# Patient Record
Sex: Female | Born: 1960
Health system: Southern US, Community
[De-identification: ages and names within clinical notes are randomized; demographics above are authoritative.]

## PROBLEM LIST (undated history)

## (undated) DIAGNOSIS — Z5189 Encounter for other specified aftercare: Secondary | ICD-10-CM

## (undated) DIAGNOSIS — F419 Anxiety disorder, unspecified: Secondary | ICD-10-CM

## (undated) DIAGNOSIS — F32A Depression, unspecified: Secondary | ICD-10-CM

## (undated) DIAGNOSIS — K3184 Gastroparesis: Secondary | ICD-10-CM

## (undated) DIAGNOSIS — R410 Disorientation, unspecified: Secondary | ICD-10-CM

## (undated) DIAGNOSIS — G8929 Other chronic pain: Secondary | ICD-10-CM

## (undated) DIAGNOSIS — M549 Dorsalgia, unspecified: Secondary | ICD-10-CM

## (undated) DIAGNOSIS — IMO0001 Reserved for inherently not codable concepts without codable children: Secondary | ICD-10-CM

## (undated) DIAGNOSIS — F329 Major depressive disorder, single episode, unspecified: Secondary | ICD-10-CM

## (undated) DIAGNOSIS — E162 Hypoglycemia, unspecified: Secondary | ICD-10-CM

## (undated) DIAGNOSIS — C50919 Malignant neoplasm of unspecified site of unspecified female breast: Secondary | ICD-10-CM

## (undated) DIAGNOSIS — R413 Other amnesia: Secondary | ICD-10-CM

## (undated) DIAGNOSIS — R51 Headache: Secondary | ICD-10-CM

## (undated) DIAGNOSIS — K219 Gastro-esophageal reflux disease without esophagitis: Secondary | ICD-10-CM

## (undated) DIAGNOSIS — G35 Multiple sclerosis: Secondary | ICD-10-CM

## (undated) DIAGNOSIS — G709 Myoneural disorder, unspecified: Secondary | ICD-10-CM

## (undated) HISTORY — DX: Hypoglycemia, unspecified: E16.2

## (undated) HISTORY — DX: Myoneural disorder, unspecified: G70.9

## (undated) HISTORY — DX: Multiple sclerosis: G35

## (undated) HISTORY — DX: Gastro-esophageal reflux disease without esophagitis: K21.9

## (undated) HISTORY — DX: Malignant neoplasm of unspecified site of unspecified female breast: C50.919

---

## 1985-12-10 DIAGNOSIS — G35 Multiple sclerosis: Secondary | ICD-10-CM

## 1985-12-10 HISTORY — DX: Multiple sclerosis: G35

## 2000-12-10 HISTORY — PX: BREAST LUMPECTOMY: SHX2

## 2000-12-10 HISTORY — PX: BREAST BIOPSY: SHX20

## 2001-10-10 DIAGNOSIS — C50919 Malignant neoplasm of unspecified site of unspecified female breast: Secondary | ICD-10-CM

## 2001-10-10 HISTORY — DX: Malignant neoplasm of unspecified site of unspecified female breast: C50.919

## 2001-10-17 ENCOUNTER — Other Ambulatory Visit: Admission: RE | Admit: 2001-10-17 | Discharge: 2001-10-17 | Payer: Self-pay | Admitting: General Surgery

## 2001-10-28 ENCOUNTER — Encounter: Admission: RE | Admit: 2001-10-28 | Discharge: 2001-10-28 | Payer: Self-pay | Admitting: General Surgery

## 2001-10-28 ENCOUNTER — Encounter: Payer: Self-pay | Admitting: General Surgery

## 2001-10-29 ENCOUNTER — Encounter (INDEPENDENT_AMBULATORY_CARE_PROVIDER_SITE_OTHER): Payer: Self-pay | Admitting: Specialist

## 2001-10-29 ENCOUNTER — Ambulatory Visit (HOSPITAL_BASED_OUTPATIENT_CLINIC_OR_DEPARTMENT_OTHER): Admission: RE | Admit: 2001-10-29 | Discharge: 2001-10-29 | Payer: Self-pay | Admitting: General Surgery

## 2001-11-24 ENCOUNTER — Emergency Department (HOSPITAL_COMMUNITY): Admission: EM | Admit: 2001-11-24 | Discharge: 2001-11-25 | Payer: Self-pay | Admitting: Emergency Medicine

## 2001-12-05 ENCOUNTER — Ambulatory Visit: Admission: RE | Admit: 2001-12-05 | Discharge: 2002-03-05 | Payer: Self-pay | Admitting: Radiation Oncology

## 2001-12-08 ENCOUNTER — Ambulatory Visit (HOSPITAL_COMMUNITY): Admission: RE | Admit: 2001-12-08 | Discharge: 2001-12-08 | Payer: Self-pay | Admitting: Oncology

## 2001-12-08 ENCOUNTER — Encounter: Payer: Self-pay | Admitting: Oncology

## 2001-12-11 ENCOUNTER — Ambulatory Visit (HOSPITAL_COMMUNITY): Admission: RE | Admit: 2001-12-11 | Discharge: 2001-12-11 | Payer: Self-pay | Admitting: Oncology

## 2001-12-11 ENCOUNTER — Encounter: Payer: Self-pay | Admitting: Oncology

## 2001-12-25 ENCOUNTER — Ambulatory Visit (HOSPITAL_COMMUNITY): Admission: RE | Admit: 2001-12-25 | Discharge: 2001-12-25 | Payer: Self-pay | Admitting: Thoracic Surgery

## 2002-01-01 ENCOUNTER — Encounter: Payer: Self-pay | Admitting: Thoracic Surgery

## 2002-01-01 ENCOUNTER — Encounter (INDEPENDENT_AMBULATORY_CARE_PROVIDER_SITE_OTHER): Payer: Self-pay | Admitting: Specialist

## 2002-01-01 ENCOUNTER — Encounter (INDEPENDENT_AMBULATORY_CARE_PROVIDER_SITE_OTHER): Payer: Self-pay | Admitting: *Deleted

## 2002-01-01 ENCOUNTER — Ambulatory Visit (HOSPITAL_COMMUNITY): Admission: RE | Admit: 2002-01-01 | Discharge: 2002-01-01 | Payer: Self-pay | Admitting: Thoracic Surgery

## 2002-03-16 ENCOUNTER — Ambulatory Visit: Admission: RE | Admit: 2002-03-16 | Discharge: 2002-06-14 | Payer: Self-pay | Admitting: Radiation Oncology

## 2002-05-26 ENCOUNTER — Encounter: Payer: Self-pay | Admitting: Emergency Medicine

## 2002-05-27 ENCOUNTER — Inpatient Hospital Stay (HOSPITAL_COMMUNITY): Admission: EM | Admit: 2002-05-27 | Discharge: 2002-05-27 | Payer: Self-pay | Admitting: Oncology

## 2003-03-31 ENCOUNTER — Encounter: Payer: Self-pay | Admitting: General Surgery

## 2003-03-31 ENCOUNTER — Encounter: Admission: RE | Admit: 2003-03-31 | Discharge: 2003-03-31 | Payer: Self-pay | Admitting: General Surgery

## 2004-01-12 ENCOUNTER — Emergency Department (HOSPITAL_COMMUNITY): Admission: EM | Admit: 2004-01-12 | Discharge: 2004-01-13 | Payer: Self-pay

## 2004-04-19 ENCOUNTER — Inpatient Hospital Stay (HOSPITAL_COMMUNITY): Admission: EM | Admit: 2004-04-19 | Discharge: 2004-04-24 | Payer: Self-pay

## 2004-05-11 ENCOUNTER — Encounter: Admission: RE | Admit: 2004-05-11 | Discharge: 2004-08-09 | Payer: Self-pay

## 2005-01-24 ENCOUNTER — Ambulatory Visit: Payer: Self-pay | Admitting: Internal Medicine

## 2005-02-08 ENCOUNTER — Ambulatory Visit: Payer: Self-pay | Admitting: Oncology

## 2005-06-28 ENCOUNTER — Ambulatory Visit: Payer: Self-pay | Admitting: Oncology

## 2005-10-03 ENCOUNTER — Ambulatory Visit: Payer: Self-pay | Admitting: Internal Medicine

## 2005-11-21 ENCOUNTER — Ambulatory Visit: Payer: Self-pay | Admitting: Family Medicine

## 2005-12-31 ENCOUNTER — Ambulatory Visit: Payer: Self-pay | Admitting: Oncology

## 2006-02-22 ENCOUNTER — Ambulatory Visit: Payer: Self-pay | Admitting: Internal Medicine

## 2006-03-26 ENCOUNTER — Ambulatory Visit: Payer: Self-pay | Admitting: Internal Medicine

## 2006-06-27 ENCOUNTER — Ambulatory Visit: Payer: Self-pay | Admitting: Oncology

## 2006-07-11 LAB — ESTRADIOL, ULTRA SENS: Estradiol, Ultra Sensitive: 8 pg/mL

## 2006-08-14 ENCOUNTER — Emergency Department (HOSPITAL_COMMUNITY): Admission: EM | Admit: 2006-08-14 | Discharge: 2006-08-14 | Payer: Self-pay | Admitting: Emergency Medicine

## 2006-12-26 ENCOUNTER — Ambulatory Visit: Payer: Self-pay | Admitting: Oncology

## 2007-01-06 ENCOUNTER — Emergency Department (HOSPITAL_COMMUNITY): Admission: EM | Admit: 2007-01-06 | Discharge: 2007-01-07 | Payer: Self-pay | Admitting: Emergency Medicine

## 2007-06-27 ENCOUNTER — Ambulatory Visit: Payer: Self-pay | Admitting: Oncology

## 2007-07-01 ENCOUNTER — Encounter: Payer: Self-pay | Admitting: Internal Medicine

## 2007-08-06 DIAGNOSIS — Z853 Personal history of malignant neoplasm of breast: Secondary | ICD-10-CM | POA: Insufficient documentation

## 2007-10-29 ENCOUNTER — Ambulatory Visit: Payer: Self-pay | Admitting: Oncology

## 2007-10-30 ENCOUNTER — Encounter: Payer: Self-pay | Admitting: Internal Medicine

## 2007-10-31 ENCOUNTER — Encounter: Payer: Self-pay | Admitting: Internal Medicine

## 2008-01-07 ENCOUNTER — Ambulatory Visit: Payer: Self-pay | Admitting: Internal Medicine

## 2008-01-07 DIAGNOSIS — J069 Acute upper respiratory infection, unspecified: Secondary | ICD-10-CM | POA: Insufficient documentation

## 2008-03-09 ENCOUNTER — Ambulatory Visit: Payer: Self-pay | Admitting: Internal Medicine

## 2008-04-22 ENCOUNTER — Encounter: Payer: Self-pay | Admitting: Internal Medicine

## 2008-04-27 ENCOUNTER — Ambulatory Visit: Payer: Self-pay | Admitting: Oncology

## 2008-04-29 ENCOUNTER — Encounter: Payer: Self-pay | Admitting: Internal Medicine

## 2008-06-21 ENCOUNTER — Ambulatory Visit: Payer: Self-pay | Admitting: Internal Medicine

## 2008-08-17 ENCOUNTER — Emergency Department (HOSPITAL_COMMUNITY): Admission: EM | Admit: 2008-08-17 | Discharge: 2008-08-17 | Payer: Self-pay | Admitting: Emergency Medicine

## 2008-10-11 ENCOUNTER — Encounter: Payer: Self-pay | Admitting: Internal Medicine

## 2008-10-26 ENCOUNTER — Ambulatory Visit: Payer: Self-pay | Admitting: Oncology

## 2008-10-28 ENCOUNTER — Encounter: Payer: Self-pay | Admitting: Internal Medicine

## 2008-11-25 ENCOUNTER — Ambulatory Visit: Payer: Self-pay | Admitting: Internal Medicine

## 2009-04-13 ENCOUNTER — Encounter: Payer: Self-pay | Admitting: Internal Medicine

## 2009-05-29 ENCOUNTER — Emergency Department (HOSPITAL_COMMUNITY): Admission: EM | Admit: 2009-05-29 | Discharge: 2009-05-29 | Payer: Self-pay | Admitting: Emergency Medicine

## 2009-05-31 ENCOUNTER — Ambulatory Visit: Payer: Self-pay | Admitting: Internal Medicine

## 2009-05-31 DIAGNOSIS — K529 Noninfective gastroenteritis and colitis, unspecified: Secondary | ICD-10-CM | POA: Insufficient documentation

## 2009-08-04 ENCOUNTER — Ambulatory Visit: Payer: Self-pay | Admitting: Oncology

## 2009-08-08 ENCOUNTER — Encounter: Payer: Self-pay | Admitting: Internal Medicine

## 2009-10-26 ENCOUNTER — Encounter (INDEPENDENT_AMBULATORY_CARE_PROVIDER_SITE_OTHER): Payer: Self-pay | Admitting: *Deleted

## 2010-02-13 ENCOUNTER — Ambulatory Visit: Payer: Self-pay | Admitting: Internal Medicine

## 2010-02-13 DIAGNOSIS — Z8719 Personal history of other diseases of the digestive system: Secondary | ICD-10-CM | POA: Insufficient documentation

## 2010-02-16 ENCOUNTER — Ambulatory Visit: Payer: Self-pay | Admitting: Internal Medicine

## 2010-02-16 ENCOUNTER — Encounter: Payer: Self-pay | Admitting: Internal Medicine

## 2010-02-16 DIAGNOSIS — R269 Unspecified abnormalities of gait and mobility: Secondary | ICD-10-CM | POA: Insufficient documentation

## 2010-02-16 DIAGNOSIS — R5383 Other fatigue: Secondary | ICD-10-CM

## 2010-02-16 DIAGNOSIS — R5381 Other malaise: Secondary | ICD-10-CM | POA: Insufficient documentation

## 2010-02-16 DIAGNOSIS — G35 Multiple sclerosis: Secondary | ICD-10-CM | POA: Insufficient documentation

## 2010-02-23 ENCOUNTER — Encounter: Payer: Self-pay | Admitting: Internal Medicine

## 2010-02-24 ENCOUNTER — Telehealth: Payer: Self-pay | Admitting: Internal Medicine

## 2010-03-02 ENCOUNTER — Ambulatory Visit: Payer: Self-pay | Admitting: Oncology

## 2010-03-06 ENCOUNTER — Encounter: Payer: Self-pay | Admitting: Internal Medicine

## 2010-03-27 ENCOUNTER — Encounter: Payer: Self-pay | Admitting: Internal Medicine

## 2010-04-03 ENCOUNTER — Encounter: Payer: Self-pay | Admitting: Internal Medicine

## 2010-04-18 ENCOUNTER — Encounter: Payer: Self-pay | Admitting: Internal Medicine

## 2010-04-19 ENCOUNTER — Encounter: Payer: Self-pay | Admitting: Internal Medicine

## 2010-06-30 ENCOUNTER — Encounter: Payer: Self-pay | Admitting: Internal Medicine

## 2010-07-27 ENCOUNTER — Ambulatory Visit: Payer: Self-pay | Admitting: Internal Medicine

## 2010-09-21 ENCOUNTER — Telehealth: Payer: Self-pay | Admitting: Internal Medicine

## 2010-10-30 ENCOUNTER — Encounter: Payer: Self-pay | Admitting: Internal Medicine

## 2011-01-11 NOTE — Medication Information (Signed)
Summary: Written Order for Mobility Assessment/[facility]  Written Order for Mobility Assessment/[facility]   Imported By: Maryln Gottron 04/06/2010 14:12:28  _____________________________________________________________________  External Attachment:    Type:   Image     Comment:   External Document

## 2011-01-11 NOTE — Letter (Signed)
Summary: [facility] Sclerosis  [facility]Multiple [facility]   Imported By: Maryln Gottron 11/08/2010 15:46:21  _____________________________________________________________________  External Attachment:    Type:   Image     Comment:   External Document

## 2011-01-11 NOTE — Letter (Signed)
Summary: [facility]  [facility] [facility]   Imported By: Maryln Gottron 04/26/2010 13:10:55  _____________________________________________________________________  External Attachment:    Type:   Image     Comment:   External Document

## 2011-01-11 NOTE — Assessment & Plan Note (Signed)
Summary: discuss getting a scooter//ccm   Vital Signs:  Patient profile:   50 year old female Weight:      129 pounds Temp:     98.4 degrees F oral BP sitting:   118 / 68  (right arm) Cuff size:   regular  Vitals Entered By: Duard Brady LPN (February 16, 2010 2:46 PM) CC: needs to be eval'd for scooter for mobility  Is Patient Diabetic? No   CC:  needs to be eval'd for scooter for mobility .  History of Present Illness: 50 year old patient who is seen today for follow-up.  She has been seen recently for an episode of bright red rectal bleeding, which has not recurred.  She is seen today for a face-to-face mobility, examination and report.  She has a long history of multiple sclerosis and gait instability.  She is unable to analyte without a 4-point walker and tires easily.  she has an ataxic gait, as well as the some upper extremity ataxia.  The left arm greater than the right. Face-to-face examination and report performed  and  questionnaire completed  Preventive Screening-Counseling & Management  Alcohol-Tobacco     Smoking Status: current  Allergies: 1)  ! Sulf-10  Past History:  Past Medical History: Breast cancer, hx of  (stage II T2 NO MO) Multiple sclerosis gravida one, para zero, abortus one ataxic gait  Family History: Reviewed history from 01/07/2008 and no changes required. paternal grandmother died with leukemia.  Three maternal aunts with breast cancer.  Mother history ovarian cancer and died from complications of diabetes one brother 3 sisters  Review of Systems       The patient complains of muscle weakness and difficulty walking.  The patient denies anorexia, fever, weight loss, weight gain, vision loss, decreased hearing, hoarseness, chest pain, syncope, dyspnea on exertion, peripheral edema, prolonged cough, headaches, hemoptysis, abdominal pain, melena, hematochezia, severe indigestion/heartburn, hematuria, incontinence, genital sores, suspicious  skin lesions, transient blindness, depression, unusual weight change, abnormal bleeding, enlarged lymph nodes, angioedema, breast masses, and testicular masses.    Physical Exam  General:  Well-developed,well-nourished,in no acute distress; alert,appropriate and cooperative throughout examination; normal blood pressure Head:  Normocephalic and atraumatic without obvious abnormalities. No apparent alopecia or balding. Eyes:  No corneal or conjunctival inflammation noted. EOMI. Perrla. Funduscopic exam benign, without hemorrhages, exudates or papilledema. Vision grossly normal. Mouth:  Oral mucosa and oropharynx without lesions or exudates.  Teeth in good repair. Neck:  No deformities, masses, or tenderness noted. Lungs:  Normal respiratory effort, chest expands symmetrically. Lungs are clear to auscultation, no crackles or wheezes. Heart:  Normal rate and regular rhythm. S1 and S2 normal without gallop, murmur, click, rub or other extra sounds. Neurologic:  alert & oriented X3 and cranial nerves II-XII intact.   mild the lower extremity weakness mild right hand dyspraxia and gross ataxia of the left hand; ataxic gait   Impression & Recommendations:  Problem # 1:  RECTAL BLEEDING, HX OF (ICD-V12.79) stable  Problem # 2:  BREAST CANCER, HX OF (ICD-V10.3)  Problem # 3:  MULTIPLE SCLEROSIS, RELAPSING/REMITTING (ICD-340)  Problem # 4:  GAIT ATAXIA (ICD-781.2)  Problem # 5:  WEAKNESS (ICD-780.79)  Complete Medication List: 1)  Protonix 40 Mg Tbec (Pantoprazole sodium) .[name].. 1 once daily 2)  Klonopin 0.5 Mg Tabs (Clonazepam) .... 2 q am 3)  Amantadine Hcl 100 Mg Tabs (Amantadine hcl) .[name].. 1 two times a day 4)  Rebif 44 Mcg/0.23ml Soln (Interferon beta-1a) .[name].. 1 3x week  5)  Ditropan Xl 5 Mg Tb24 (Oxybutynin chloride) .[name].. 1 once daily 6)  Aromasin 25 Mg Tabs (Exemestane) .[name].. 1 once daily 7)  Prascion Ra 10-5 % Crea (Sulfacetamide-sulfur-sunscreen) .... Apply two times a day 8)  Actonel  150 Mg Tabs (Risedronate sodium) .... One monthly 9)  Hydrocodone-acetaminophen 5-500 Mg Tabs (Hydrocodone-acetaminophen) .... One every 6 hours as needed for sore throat or pain  Patient Instructions: 1)  Please schedule a follow-up appointment in 3 months. 2)  neurology follow-up as scheduled

## 2011-01-11 NOTE — Medication Information (Signed)
Summary: [employer] [facility]-Additional Documentation  [employer] [facility]-Additional Documentation   Imported By: Maryln Gottron 03/29/2010 15:19:57  _____________________________________________________________________  External Attachment:    Type:   Image     Comment:   External Document

## 2011-01-11 NOTE — Letter (Signed)
Summary: Face to Face Mobility Examination Report  Face to Face Mobility Examination Report   Imported By: Maryln Gottron 02/20/2010 14:51:49  _____________________________________________________________________  External Attachment:    Type:   Image     Comment:   External Document

## 2011-01-11 NOTE — Assessment & Plan Note (Signed)
Summary: nausea/rash/tick bite?/dm   Vital Signs:  Patient profile:   50 year old female Weight:      121 pounds Temp:     98.1 degrees F oral BP sitting:   130 / 90  (left arm) Cuff size:   regular  Vitals Entered By: Kathrynn Speed CMA (July 27, 2010 11:39 AM) CC: Nausea, since yesterday, rash, tick bite, found it on Monday night,src   CC:  Nausea, since yesterday, rash, tick bite, found it on Monday night, and src.  History of Present Illness: a 50 year old patient who is seen today for follow-up.  She has a history of chronic MS.  5 days ago,   The patient had a tick exposure involving her right posterior thigh region.  There is a no local skin reaction.  She has had no systemic symptoms.  Her concern about the tick exposure prompted her visit today.  She denies any fever, myalgias, or headache  Current Medications (verified): 1)  Protonix 40 Mg Tbec (Pantoprazole Sodium) .[name].. 1 Once Daily 2)  Klonopin 0.5 Mg  Tabs (Clonazepam) .... 2 Q Am 3)  Amantadine Hcl 100 Mg  Tabs (Amantadine Hcl) .[name].. 1 Two Times A Day 4)  Rebif 44 Mcg/0.33ml  Soln (Interferon Beta-1a) .[name].. 1 3x Week 5)  Ditropan Xl 5 Mg  Tb24 (Oxybutynin Chloride) .[name].. 1 Once Daily 6)  Aromasin 25 Mg  Tabs (Exemestane) .[name].. 1 Once Daily 7)  Prascion Ra 10-5 %  Crea (Sulfacetamide-Sulfur-Sunscreen) .... Apply Two Times A Day 8)  Actonel 150 Mg  Tabs (Risedronate Sodium) .... One Monthly 9)  Hydrocodone-Acetaminophen 5-500 Mg Tabs (Hydrocodone-Acetaminophen) .... One Every 6 Hours As Needed For Sore Throat or Pain  Allergies (verified): 1)  ! Sulf-10  Past History:  Past Medical History: Reviewed history from 02/16/2010 and no changes required. Breast cancer, hx of  (stage II T2 NO MO) Multiple sclerosis gravida one, para zero, abortus one ataxic gait  Review of Systems  The patient denies anorexia, fever, weight loss, weight gain, vision loss, decreased hearing, hoarseness, chest pain, syncope, dyspnea on  exertion, peripheral edema, prolonged cough, headaches, hemoptysis, abdominal pain, melena, hematochezia, severe indigestion/heartburn, hematuria, incontinence, genital sores, muscle weakness, suspicious skin lesions, transient blindness, difficulty walking, depression, unusual weight change, abnormal bleeding, enlarged lymph nodes, angioedema, and breast masses.         unremarkable except for exposure  Physical Exam  General:  Well-developed,well-nourished,in no acute distress; alert,appropriate and cooperative throughout examination; wheelchair-bound Head:  Normocephalic and atraumatic without obvious abnormalities. No apparent alopecia or balding. Neck:  No deformities, masses, or tenderness noted. Lungs:  Normal respiratory effort, chest expands symmetrically. Lungs are clear to auscultation, no crackles or wheezes. Heart:  Normal rate and regular rhythm. S1 and S2 normal without gallop, murmur, click, rub or other extra sounds.   Impression & Recommendations:  Problem # 1:  TICK BITE (ICD-E906.4) will clinically observe at this time.  The patient was instructed to call the office immediately if she develops any fever or signs of acute illness  Problem # 2:  MULTIPLE SCLEROSIS, RELAPSING/REMITTING (ICD-340)  Complete Medication List: 1)  Protonix 40 Mg Tbec (Pantoprazole sodium) .[name].. 1 once daily 2)  Klonopin 0.5 Mg Tabs (Clonazepam) .... 2 q am 3)  Amantadine Hcl 100 Mg Tabs (Amantadine hcl) .[name].. 1 two times a day 4)  Rebif 44 Mcg/0.84ml Soln (Interferon beta-1a) .[name].. 1 3x week 5)  Ditropan Xl 5 Mg Tb24 (Oxybutynin chloride) .[name].. 1 once daily 6)  Aromasin 25 Mg Tabs (Exemestane) .[name].. 1 once daily 7)  Prascion Ra 10-5 % Crea (Sulfacetamide-sulfur-sunscreen) .... Apply two times a day 8)  Actonel 150 Mg Tabs (Risedronate sodium) .... One monthly 9)  Hydrocodone-acetaminophen 5-500 Mg Tabs (Hydrocodone-acetaminophen) .... One every 6 hours as needed for sore throat or pain  Patient  Instructions: 1)  call the office immediately if  you develops fever or any signs of illness

## 2011-01-11 NOTE — Letter (Signed)
Summary: [facility] Delivered   Imported By: Maryln Gottron 08/02/2010 14:12:40  _____________________________________________________________________  External Attachment:    Type:   Image     Comment:   External Document

## 2011-01-11 NOTE — Progress Notes (Signed)
Summary: changes/clarifications needed  Phone Note From Other Clinic   Caller: erica jones, [facility] [phone] Summary of Call: Refaxing form to you for clarifications or changes she needs.  5 pages, see all pages, she cannot make changes on the original & will need you to make them on the original & fax back.   Initial call taken by: Rudy Jew, RN,  February 24, 2010 2:18 PM    done

## 2011-01-11 NOTE — Letter (Signed)
[facility]   Imported By: Maryln Gottron 03/23/2010 13:08:51  _____________________________________________________________________  External Attachment:    Type:   Image     Comment:   External Document

## 2011-01-11 NOTE — Medication Information (Signed)
Summary: Additional Documentation/[employer]  Additional Documentation/[employer] [facility]   Imported By: Maryln Gottron 02/24/2010 12:35:15  _____________________________________________________________________  External Attachment:    Type:   Image     Comment:   External Document

## 2011-01-11 NOTE — Progress Notes (Signed)
Summary: refill pantoprazole  Phone Note Refill Request Message from:  Fax from Pharmacy on September 21, 2010 10:07 AM  pantoprazole 40mg  [facility]   Method Requested: Fax to Local Pharmacy Initial call taken by: Duard Brady LPN,  September 21, 2010 10:07 AM    Prescriptions: PROTONIX 40 MG TBEC (PANTOPRAZOLE SODIUM) 1 once daily  #90 x 3   Entered by:   Duard Brady LPN   Authorized by:   Gordy Savers  MD   Signed by:   Duard Brady LPN on 16/09/9603   Method used:   Historical   RxID:   [id number]  faxed back to [facility]   KIK

## 2011-01-11 NOTE — Assessment & Plan Note (Signed)
Summary: rectal bleeding/cdw   Vital Signs:  Patient profile:   50 year old female Weight:      127 pounds Temp:     97.9 degrees F oral BP sitting:   118 / 80  (right arm) Cuff size:   regular  Vitals Entered By: Duard Brady LPN (February 13, 1609 11:55 AM) CC: c/o rectal belle this am only - more than 1tsp noted in toilet - not with BM Is Patient Diabetic? No   CC:  c/o rectal belle this am only - more than 1tsp noted in toilet - not with BM.  History of Present Illness: 50 year old patient who is seen today for follow-up after a single episode of emesis bright red rectal bleeding that occurred earlier today.  Her bowel movements, was otherwise unremarkable and not associated with pain.  This too, was of normal color and consistency.  She denies any history of diverticulosis and hemorrhoids.  Her prior history of rectal bleeding.  No family history of colon cancer.  Denies any chronic abdominal pain or change in her bowel habits  Preventive Screening-Counseling & Management  Alcohol-Tobacco     Smoking Status: current  Allergies: 1)  ! Sulf-10  Past History:  Past Medical History: Reviewed history from 01/07/2008 and no changes required. Breast cancer, hx of  (stage II T2 NO MO) Multiple sclerosis gravida one, para zero, abortus one  Social History: Smoking Status:  current  Review of Systems       The patient complains of hematochezia.  The patient denies anorexia, fever, weight loss, weight gain, vision loss, decreased hearing, hoarseness, chest pain, syncope, dyspnea on exertion, peripheral edema, prolonged cough, headaches, hemoptysis, abdominal pain, melena, severe indigestion/heartburn, hematuria, incontinence, genital sores, muscle weakness, suspicious skin lesions, transient blindness, difficulty walking, depression, unusual weight change, abnormal bleeding, enlarged lymph nodes, angioedema, and breast masses.    Physical Exam  General:   Well-developed,well-nourished,in no acute distress; alert,appropriate and cooperative throughout examination Head:  Normocephalic and atraumatic without obvious abnormalities. No apparent alopecia or balding. Neck:  No deformities, masses, or tenderness noted. Lungs:  Normal respiratory effort, chest expands symmetrically. Lungs are clear to auscultation, no crackles or wheezes. Heart:  Normal rate and regular rhythm. S1 and S2 normal without gallop, murmur, click, rub or other extra sounds. Rectal:  No external abnormalities noted. Normal sphincter tone. No rectal masses or tenderness. stool was hematest positive   Impression & Recommendations:  Problem # 1:  RECTAL BLEEDING, HX OF (ICD-V12.79) this appears to be a single episode of rectal bleeding.  She has a normal clinical exam.  Will clinically observe at this time and refer for colonoscopy.  If bleeding recurs.  Otherwise, a screening colonoscopy will be obtained at age 5  Complete Medication List: 1)  Protonix 40 Mg Tbec (Pantoprazole sodium) .[name].. 1 once daily 2)  Klonopin 0.5 Mg Tabs (Clonazepam) .... 2 q am 3)  Amantadine Hcl 100 Mg Tabs (Amantadine hcl) .[name].. 1 two times a day 4)  Rebif 44 Mcg/0.47ml Soln (Interferon beta-1a) .[name].. 1 3x week 5)  Ditropan Xl 5 Mg Tb24 (Oxybutynin chloride) .[name].. 1 once daily 6)  Aromasin 25 Mg Tabs (Exemestane) .[name].. 1 once daily 7)  Prascion Ra 10-5 % Crea (Sulfacetamide-sulfur-sunscreen) .... Apply two times a day 8)  Actonel 150 Mg Tabs (Risedronate sodium) .... One monthly 9)  Hydrocodone-acetaminophen 5-500 Mg Tabs (Hydrocodone-acetaminophen) .... One every 6 hours as needed for sore throat or pain  Patient Instructions: 1)  call  if bleeding recurs or if you develop weakness, or dizziness

## 2011-01-20 ENCOUNTER — Other Ambulatory Visit: Payer: Self-pay | Admitting: Family Medicine

## 2011-01-20 DIAGNOSIS — K219 Gastro-esophageal reflux disease without esophagitis: Secondary | ICD-10-CM

## 2011-03-19 ENCOUNTER — Encounter (HOSPITAL_BASED_OUTPATIENT_CLINIC_OR_DEPARTMENT_OTHER): Payer: No Typology Code available for payment source | Admitting: Oncology

## 2011-03-19 DIAGNOSIS — Z853 Personal history of malignant neoplasm of breast: Secondary | ICD-10-CM

## 2011-03-19 DIAGNOSIS — Z09 Encounter for follow-up examination after completed treatment for conditions other than malignant neoplasm: Secondary | ICD-10-CM

## 2011-03-19 DIAGNOSIS — G35D Multiple sclerosis, unspecified: Secondary | ICD-10-CM

## 2011-03-19 DIAGNOSIS — G35 Multiple sclerosis: Secondary | ICD-10-CM

## 2011-03-19 DIAGNOSIS — C50519 Malignant neoplasm of lower-outer quadrant of unspecified female breast: Secondary | ICD-10-CM

## 2011-03-19 LAB — CBC
Hemoglobin: 13.1 g/dL (ref 12.0–15.0)
MCV: 91.4 fL (ref 78.0–100.0)
Platelets: 161 10*3/uL (ref 150–400)
RBC: 4.24 MIL/uL (ref 3.87–5.11)
RDW: 13.3 % (ref 11.5–15.5)
WBC: 5.9 10*3/uL (ref 4.0–10.5)

## 2011-03-19 LAB — POCT I-STAT, CHEM 8
BUN: 18 mg/dL (ref 6–23)
Calcium, Ion: 1.2 mmol/L (ref 1.12–1.32)
Chloride: 104 mEq/L (ref 96–112)
Creatinine, Ser: 0.8 mg/dL (ref 0.4–1.2)
HCT: 39 % (ref 36.0–46.0)
Potassium: 3.5 mEq/L (ref 3.5–5.1)
Sodium: 142 mEq/L (ref 135–145)

## 2011-03-19 LAB — DIFFERENTIAL
Basophils Absolute: 0 10*3/uL (ref 0.0–0.1)
Neutro Abs: 4.2 10*3/uL (ref 1.7–7.7)
Neutrophils Relative %: 71 % (ref 43–77)

## 2011-03-19 LAB — HEMOCCULT GUIAC POC 1CARD (OFFICE): Fecal Occult Bld: POSITIVE

## 2011-04-27 NOTE — H&P (Signed)
[facility]  Patient:    Stephanie Zhang, CARDON Visit Number: [id number] MRN: [id number]          Service Type: MED Location: 2S [id number] Attending Physician:  Lucile Shutters Dictated by:   Genene Churn. Cyndie Chime, M.D. Admit Date:  05/27/2002 Discharge Date: 05/27/2002   CC:         Sharlet Salina T. Hoxworth, M.D.  Dr. Conrad Lamy, Dept. of Neurology, [facility]  Maryln Gottron, M.D.  Leighton Roach. Truett Perna, M.D.   History and Physical  CHIEF COMPLAINT:  Fever, weakness, and urinary frequency and urgency.  HISTORY OF PRESENT ILLNESS:  A complicated 50 year old woman diagnosed with multiple sclerosis at age 21.  She has been on Avonex weekly injections for the last seven or eight years.  She had a routine screening mammography done in November 2002 and was found to have a suspicious lesion in the lower outer quadrant of the right breast.  Biopsy confirmed cancer.  She underwent a right partial mastectomy and sentinel lymph node procedure by Sharlet Salina T. Hoxworth, M.D., on October 29, 2001.  She was found to have a 2.6 cm moderately-differentiated invasive cancer.  Strongly ER/PR positive.  HER-2 negative.  Two sentinel lymph nodes negative for cancer and a low S-phase of 3.5% (stage II, T2, N0, M0).  She was put on a chemotherapy program, which was completed in April.  She is currently receiving breast radiation and is due to complete all planned treatment today.  Due to prominent mediastinal lymph nodes on a CT scan of her chest, she underwent mediastinoscopy and biopsy by D. Karle Plumber, M.D., on January 01, 2002.  Lymph nodes showed noncaseating granulomas and were negative for tumor.  Findings most consistent with sarcoidosis.  She has been on no specific treatment for this.  Over the last 24 hours she has developed progressive weakness, sneezing spells without any cough, presyncope, and then fever to 101 degrees.  She admits  to urinary urgency and frequency without any suprapubic tenderness or dysuria. She just resumed her Avonex injections last week and took an injection today. She routinely premedicates with ibuprofen but states that she has not had fevers associated with these injections in the past.  She is admitted for further evaluation.  PAST MEDICAL HISTORY:  As noted above.  MEDICATIONS:  Weekly Avonex, premedication with ibuprofen.  ALLERGIES:  SULFA with hives and a rash.  FAMILY HISTORY:  Mother died of complications of diabetes, also had cervical cancer.  She has a brother and three sisters.  SOCIAL HISTORY:  She is married.  She has been pregnant but does not have any living children.  No tobacco.  Rare alcohol use.  REVIEW OF SYSTEMS:  She had a headache only tonight when she arrived in the emergency department.  She has not noted any acute change in her vision.  She did not particularly think she was having a flare-up of her MS.  Her husband was concerned that her extremities were very cold this evening.  Also concerned that she was so weak that she could barely walk.  No nausea, vomiting, diarrhea.  She states she has a mild intermittent cough, not productive.  No chest pain.  A remote history of scarlet fever as a child, per her sister.  PHYSICAL EXAMINATION:  VITAL SIGNS:  Pulse 108 and regular, blood pressure 96/58, temperature 101.2, respirations 26.  GENERAL:  A thin female in no acute distress.  SKIN:  Flushed.  HEENT:  Pupils  are equal, reactive to light.  Optic discs are sharp.  There is significant strabismus and nystagmus.  Pharynx:  No erythema or exudate: Tympanic membranes:  Good light reflex, no erythema or exudate.  NECK:  Supple.  CHEST:  Lungs are clear, resonant to percussion.  CARDIAC:  Regular cardiac rhythm, no murmur.  LYMPHATIC:  No lymphadenopathy.  BREASTS:  There is erythema over the right breast, which is currently being radiated.  No dominant  masses in either breast.  No adenopathy.  ABDOMEN:  Soft, no mass, no tenderness, no organomegaly.  EXTREMITIES:  No edema, no calf tenderness.  Extremities are warm and not cyanotic.  NEUROLOGIC:  Cranial nerve problems as noted above.  Motor strength 5/5 except for her left foot, which is 4/5 in flexion and extension.  The coordination is poor over the left upper extremity with significant past-pointing on finger-to-finger exams and poor coordination no rapid alternating movements and finger-to-finger exam.  Babinski response is flexor.  Reflexes are 2+ and symmetric.  There is no clonus.  Gait not tested.  LABORATORY DATA:  A chest radiograph shows no infiltrates.  Lab and urinalysis pending.  IMPRESSION:  A 50 year old woman recently diagnosed with breast cancer, who has completed planned chemotherapy and is currently finishing radiation therapy.  She has a previous history of multiple sclerosis, on weekly Avonex injections.  She now presents with a 24-hour history of fever and urinary tract symptoms.  She is also having some nonspecific rhinitis.  These symptoms have, in turn, caused profound weakness.  I think it is most likely that she has a simple urinary tract infection, which has caused some decompensation in her multiple sclerosis.  Alternatively, she could have a low-grade fever from the inflammatory response to the breast radiation or the inflammatory response to the Avonex injections.  She also has an underlying history of sarcoidosis, but to date this has not been symptomatic.  She is sufficiently complicated that I would feel more comfortable bringing her in the hospital for 24-hour observation and antibiotics pending culture results.  We may need to get a neurologic consultation.  Dictated by:   Genene Churn. Cyndie Chime, M.D. Attending Physician:  Lucile Shutters DD:  05/27/02 TD:  05/28/02 Job: [id number]

## 2011-04-27 NOTE — Op Note (Signed)
[facility]  Patient:    Stephanie Zhang, Stephanie Zhang. Visit Number: [id number] MRN: [id number]          Service Type: Attending:  D. Karle Plumber, M.D. Dictated by:   D. Karle Plumber, M.D. Proc. Date: 01/12/02                             Operative Report  REDICTATION  PREOPERATIVE DIAGNOSIS:  Mediastinal adenopathy.  POSTOPERATIVE DIAGNOSIS:  Mediastinal adenopathy.  OPERATION PERFORMED:  Fiberoptic bronchoscopy and mediastinoscopy.  DESCRIPTION OF PROCEDURE:  This 50 year old patient had mediastinal and hilar adenopathy and was thought to have sarcoid, was brought to the operating room and underwent general anesthesia.  The fiberoptic bronchoscope was passed through the endotracheal tube.  The distal trachea was normal.  The carina was in the midline.  The left mainstem, left upper lobe, and left lower lobe orifices were normal.  The right mainstem, right upper lobe, right middle lobe, and right lower lobe orifices were normal.  Washings and cytology were taken from the tracheal bronchial tree.  The bronchoscope was removed.  The anterior neck was prepped and draped in the usual sterile manner.  A transverse incision was made above the sternal notch and dissection was carried down through the subcutaneous tissue to the pretracheal fascia. Digital exploration was carried out.  The video mediastinoscope was inserted and biopsies of a 4R, 2R, and 7 node were done and sent for frozen section. The frozen section revealed a granulomatous process consistent with sarcoid, noncaseating granulomas.  The mediastinoscope was removed.  The strap muscles were closed with 2-0 Vicryl, subcutaneous tissue with 3-0 Vicryl, and Dermabond was applied to the skin.  The patient returned to the recovery room in stable condition. Dictated by:   D. Karle Plumber, M.D. Attending:  D. Karle Plumber, M.D. DD:  04/13/02 TD:  04/13/02 Job: [id number]

## 2011-04-27 NOTE — Discharge Summary (Signed)
NAMEKASHIA, BROSSARD                          ACCOUNT NO.:  [id number]   MEDICAL RECORD NO.:  [id number]                   PATIENT TYPE:  INP   LOCATION:  3038                                 FACILITY:  [facility]   PHYSICIAN:  Rene Paci, M.D. [facility]          DATE OF BIRTH:  July 18, 1961   DATE OF ADMISSION:  04/18/2004  DATE OF DISCHARGE:  04/24/2004                                 DISCHARGE SUMMARY   DISCHARGE DIAGNOSES:  1. Pseudo exacerbation of multiple sclerosis, status post five days of Solu-     Medrol, improved, tingling and weakness back to baseline.  2. Advanced multiple sclerosis, followed by Dr. Conrad Shelly at [facility].     Follow up as previously scheduled.  3. Enterococcus/Escherichia coli urinary tract infection.  Antibiotic     treatment ongoing.  4. Acute renal insufficiency secondary to dehydration, resolved status post     intravenous fluids.  Discharge creatinine 0.9.  5. Hypotension secondary to dehydration, resolved.  6. History of breast cancer in 2002.  7. Hyperglycemia exacerbated by Solu-Medrol.  No history of diabetes.   DISCHARGE MEDICATIONS:  1. Augmentin 500 mg p.o. b.i.d. to complete three more days for 10 day     treatment course.  2. Klonopin 1 mg p.o. daily.  3. Amantadine 100 mg p.o. b.i.d.  4. Tamoxifen 10 mg p.o. daily.  5. Rebus 44 mcg subcutaneous Monday, Wednesday, and Friday.  6. Ditropan XL 5 mg p.o. daily.   DISPOSITION:  The patient is discharged to home in medically stable and  improved condition.  She will continue home health physical therapy and  occupational therapy as tolerated.   FOLLOWUP:  With her regular primary care physician, Dr. Eleonore Chiquito,  as needed, otherwise with neurologist at [facility] as prior to admission.   HOSPITAL COURSE:  #1 -  ACUTE PSEUDO EXACERBATION OF MULTIPLE SCLEROSIS  SECONDARY TO URINARY TRACT INFECTION:  The patient is a 50 year old woman  with advanced MS, generally followed at [facility], who was  seen by her  neurologist on the Friday prior to admission and diagnosed with an urinary  tract infection.  Started on empiric Cipro, but had progressive lower  extremity tingling, numbness, and subsequent weakness beyond her baseline,  thus her neurologist referred her for evaluation at the nearest hospital  which was [facility].  After discussion with her neurologist,  the urinary culture did in fact return Enterococcus and E. coli, requiring  broader spectrum treatment than Cipro which had been initiated.  He felt  that her symptoms were likely a pseudo exacerbation, as her MS had been  without active flare in many years, though he noted she did have advance  disease with multiple plaques/lesions.  He authorized holding of her Rebus  while she was hospitalized, and agreed with the empiric treatment of high  dose Solu-Medrol 1 g daily x5 days.  She underwent this treatment and her  symptoms  of weakness and numbness did resolve back to her baseline.  At this  time, she is felt stable to return home on her previous medications for MS  as prescribed.  Treatment for her urinary tract infection was changed to  Augmentin to cover sensitivities of both organisms, and she has remained  afebrile.  #2 -  RELATIVE HYPOTENSION WITH RENAL INSUFFICIENCY:  The patient was likely  dehydrated from her  acute illness, and was treated with IV fluids during her admission.  Her  initial creatinine of 2.1 resolved to its previous baseline of 0.9 with this  treatment alone.  Her hypotension was asymptomatic with systolic in the 90s  resolved with a blood pressure in the 130s at the time of discharge.  No  further workup indicated at this time.                                                Rene Paci, M.D. [facility]    VL/MEDQ  D:  04/24/2004  T:  04/24/2004  Job:  [id number]

## 2011-08-10 ENCOUNTER — Other Ambulatory Visit (HOSPITAL_COMMUNITY)
Admission: RE | Admit: 2011-08-10 | Discharge: 2011-08-10 | Disposition: A | Discharge status: Home / Self Care | Payer: No Typology Code available for payment source | Source: Ambulatory Visit | Attending: Gynecology | Admitting: Gynecology

## 2011-08-10 ENCOUNTER — Ambulatory Visit (INDEPENDENT_AMBULATORY_CARE_PROVIDER_SITE_OTHER): Payer: No Typology Code available for payment source | Admitting: Gynecology

## 2011-08-10 ENCOUNTER — Encounter: Payer: Self-pay | Admitting: Gynecology

## 2011-08-10 VITALS — BP 110/60 | Ht 64.0 in | Wt 113.0 lb

## 2011-08-10 DIAGNOSIS — G35 Multiple sclerosis: Secondary | ICD-10-CM | POA: Insufficient documentation

## 2011-08-10 DIAGNOSIS — E162 Hypoglycemia, unspecified: Secondary | ICD-10-CM | POA: Insufficient documentation

## 2011-08-10 DIAGNOSIS — Z01419 Encounter for gynecological examination (general) (routine) without abnormal findings: Secondary | ICD-10-CM | POA: Insufficient documentation

## 2011-08-10 DIAGNOSIS — C801 Malignant (primary) neoplasm, unspecified: Secondary | ICD-10-CM | POA: Insufficient documentation

## 2011-08-10 DIAGNOSIS — C50919 Malignant neoplasm of unspecified site of unspecified female breast: Secondary | ICD-10-CM

## 2011-08-10 DIAGNOSIS — N912 Amenorrhea, unspecified: Secondary | ICD-10-CM

## 2011-08-10 NOTE — Progress Notes (Signed)
Stephanie Zhang [birth year] [id number]        50 y.o.  for annual exam as a new patient.  Has history of breast cancer status post lumpectomy radiation and chemotherapy with followup tamoxifen. She is no longer taking any medication for this and his pastor 5 year mark. She sees Dr. Truett Perna is her oncologist and actively sees him. She has not had a period since her breast cancer treatment and on chart review does have history of elevated FSH.  She is also being followed for multiple sclerosis and uses a wheelchair.  Past medical history,surgical history, medications, allergies, family history and social history were all reviewed and documented in the EPIC chart. ROS:  Was performed and pertinent positives and negatives are included in the history.  Exam: chaperone present Filed Vitals:   08/10/11 1056  BP: 110/60   General appearance  Normal Skin grossly normal Head/Neck normal with no cervical or supraclavicular adenopathy thyroid normal Lungs  clear Cardiac RR, without RMG Abdominal  soft, nontender, without masses, organomegaly or hernia Breasts  examined lying and sitting left without masses, retractions, discharge or axillary adenopathy.  Right with Impra areola are retraction secondary to surgery and radiation all well healed and stable per history. No masses or axillary adenopathy Pelvic  Ext/BUS/vagina  normal  Mild atrophic changes noted  Cervix  normal  Pap done  Uterus  axial, normal size, shape and contour, midline and mobile nontender   Adnexa  Without masses or tenderness    Anus and perineum  normal   Rectovaginal  normal sphincter tone without palpated masses or tenderness.    Assessment/Plan:  50 y.o. female for annual exam.   History of breast cancer actively being seen by her oncologist and has mammogram scheduled next month. She'll continue to followup with him and his screening recommendations. Self breast exams on a monthly basis reviewed. He also does DEXA studies and  again she'll follow up with him for this. She is amenorrheic not bothered by hot flashes or other symptoms. Will monitor if she does any bleeding she knows to alert me. No blood work was done today this is all done through her other physicians but her oncologist and MS position. I asked her just to check with them to make sure she is getting lipid profiles and diabetes screening along with her other blood work.  Assuming she continues well from a gynecologic standpoint she'll see me in a year sooner as needed.    Dara Lords MD, 11:37 AM 08/10/2011

## 2011-08-14 ENCOUNTER — Other Ambulatory Visit: Payer: Self-pay | Admitting: Internal Medicine

## 2011-09-12 LAB — POCT I-STAT, CHEM 8
Calcium, Ion: 1.24
Creatinine, Ser: 1
Glucose, Bld: 80
HCT: 41
Hemoglobin: 13.9
Potassium: 3.5
Sodium: 142
TCO2: 26

## 2011-10-07 ENCOUNTER — Encounter: Payer: Self-pay | Admitting: *Deleted

## 2011-10-16 ENCOUNTER — Ambulatory Visit (HOSPITAL_BASED_OUTPATIENT_CLINIC_OR_DEPARTMENT_OTHER): Payer: No Typology Code available for payment source | Admitting: Oncology

## 2011-10-16 VITALS — BP 125/86 | HR 76 | Ht 64.0 in | Wt 106.2 lb

## 2011-10-16 DIAGNOSIS — C50919 Malignant neoplasm of unspecified site of unspecified female breast: Secondary | ICD-10-CM

## 2011-10-16 DIAGNOSIS — G35 Multiple sclerosis: Secondary | ICD-10-CM

## 2011-10-16 DIAGNOSIS — Z853 Personal history of malignant neoplasm of breast: Secondary | ICD-10-CM

## 2011-10-16 DIAGNOSIS — C801 Malignant (primary) neoplasm, unspecified: Secondary | ICD-10-CM

## 2011-10-16 NOTE — Progress Notes (Signed)
OFFICE PROGRESS NOTE   INTERVAL HISTORY:   Stephanie Zhang returns as scheduled. She continues to work part-time. She has no new complaints. There's been no change in either breast. A bilateral mammogram on 08/16/2011 was negative.  Objective: Vital signs in last 24 hours:  Blood pressure 125/86, pulse 76, height 5\' 4"  (1.626 m), weight 106 lb 3.2 oz (48.172 kg).   HEENT: Neck without mass Lymphatics: No cervical, supraclavicular, or axillary nodes Resp: clear to auscultation bilaterally Cardio: regular rate and rhythm GI: Abdomen, no hepatomegaly Extremities: extremities normal, atraumatic, no cyanosis or edema Breast: Status post right lumpectomy. No evidence for local tumor recurrence. No mass in either breast.   Medications: I have reviewed the patient's current medications.  Assessment/Plan:  #1-breast cancer, stage II right-sided breast cancer diagnosed in November of 2002. She completed adjuvant AC chemotherapy and breast radiation.  -She began tamoxifen in April 2003 and remained on tamoxifen until March of 2006.  -She began Aromasin in April 2006 and completed 5 years of Aromasin at the end of March 2011.  #2-multiple sclerosis  #3-Disposition: Stephanie Zhang remains in remission from breast cancer. She would like to continue follow up at the cancer Center. She will return for mouth visit in 9 months. She will be scheduled for a mammogram in September of 2013.     Stephanie Zhang BRADLEY 10/16/2011, 5:29 PM

## 2011-10-18 ENCOUNTER — Telehealth: Payer: Self-pay | Admitting: Oncology

## 2011-10-18 NOTE — Telephone Encounter (Signed)
S/w the pt regarding her mammo appt and the appt  With dr Truett Perna in September.

## 2012-01-23 ENCOUNTER — Encounter (HOSPITAL_COMMUNITY): Payer: Self-pay

## 2012-01-23 ENCOUNTER — Observation Stay (HOSPITAL_COMMUNITY)
Admission: EM | Admit: 2012-01-23 | Discharge: 2012-01-24 | Disposition: A | Discharge status: Home / Self Care | Payer: No Typology Code available for payment source | Source: Ambulatory Visit | Attending: Emergency Medicine | Admitting: Emergency Medicine

## 2012-01-23 DIAGNOSIS — R197 Diarrhea, unspecified: Secondary | ICD-10-CM | POA: Insufficient documentation

## 2012-01-23 DIAGNOSIS — G35 Multiple sclerosis: Secondary | ICD-10-CM | POA: Insufficient documentation

## 2012-01-23 DIAGNOSIS — K219 Gastro-esophageal reflux disease without esophagitis: Secondary | ICD-10-CM | POA: Insufficient documentation

## 2012-01-23 DIAGNOSIS — R112 Nausea with vomiting, unspecified: Principal | ICD-10-CM | POA: Insufficient documentation

## 2012-01-23 DIAGNOSIS — E860 Dehydration: Secondary | ICD-10-CM

## 2012-01-23 LAB — CBC
MCH: 29.9 pg (ref 26.0–34.0)
Platelets: 130 10*3/uL — ABNORMAL LOW (ref 150–400)
RDW: 13.2 % (ref 11.5–15.5)
WBC: 4.7 10*3/uL (ref 4.0–10.5)

## 2012-01-23 LAB — DIFFERENTIAL
Basophils Absolute: 0 10*3/uL (ref 0.0–0.1)
Eosinophils Absolute: 0 10*3/uL (ref 0.0–0.7)
Lymphs Abs: 1.2 10*3/uL (ref 0.7–4.0)
Neutro Abs: 2.8 10*3/uL (ref 1.7–7.7)

## 2012-01-23 LAB — POCT I-STAT, CHEM 8
BUN: 22 mg/dL (ref 6–23)
Creatinine, Ser: 0.9 mg/dL (ref 0.50–1.10)
Glucose, Bld: 91 mg/dL (ref 70–99)
Hemoglobin: 12.2 g/dL (ref 12.0–15.0)
Potassium: 3.4 mEq/L — ABNORMAL LOW (ref 3.5–5.1)
TCO2: 23 mmol/L (ref 0–100)

## 2012-01-23 MED ORDER — ZOLPIDEM TARTRATE 5 MG PO TABS: 5.0000 mg | ORAL_TABLET | Freq: Every evening | ORAL | Status: DC | PRN

## 2012-01-23 MED ORDER — ONDANSETRON HCL 4 MG/2ML IJ SOLN: 4.0000 mg | Freq: Four times a day (QID) | INTRAMUSCULAR | Status: DC | PRN

## 2012-01-23 MED ORDER — SODIUM CHLORIDE 0.9 % IV SOLN
Freq: Once | INTRAVENOUS | Status: AC
  Administered 2012-01-23: 22:00:00 via INTRAVENOUS

## 2012-01-23 MED ORDER — ACETAMINOPHEN 325 MG PO TABS: 650.0000 mg | ORAL_TABLET | ORAL | Status: DC | PRN

## 2012-01-23 MED ORDER — SODIUM CHLORIDE 0.9 % IV BOLUS (SEPSIS)
1000.0000 mL | Freq: Once | INTRAVENOUS | Status: AC
  Administered 2012-01-23: 1000 mL via INTRAVENOUS

## 2012-01-23 MED ORDER — SODIUM CHLORIDE 0.9 % IV SOLN
INTRAVENOUS | Status: AC
  Administered 2012-01-23: 20:00:00 via INTRAVENOUS

## 2012-01-23 MED ORDER — CLONAZEPAM 0.5 MG PO TABS
0.5000 mg | ORAL_TABLET | Freq: Two times a day (BID) | ORAL | Status: DC
  Administered 2012-01-23: 0.5 mg via ORAL
  Filled 2012-01-23: qty 1

## 2012-01-23 NOTE — ED Notes (Signed)
Patient presents with n/v/d intermittently since Sunday 01-20-12 with generalized weakness. Patient reports she's been unable to eat for past several days.

## 2012-01-23 NOTE — ED Provider Notes (Signed)
History     CSN: [id number]  Arrival date & time 01/23/12  1515   First MD Initiated Contact with Patient 01/23/12 1812      Chief Complaint  Patient presents with  . Diarrhea  . Nausea  . Emesis     HPI Patient presents with n/v/d intermittently since Sunday 01-20-12 with generalized weakness. Patient reports she's been unable to eat for past several days  Past Medical History  Diagnosis Date  . MS (multiple sclerosis) 1987  . Hypoglycemia   . Breast cancer 10/2001    Stage II (right side)  . Multiple sclerosis   . GERD (gastroesophageal reflux disease)   . Osteoporosis     Past Surgical History  Procedure Date  . Breast surgery 2002    rt breast lumpectomy    Family History  Problem Relation Age of Onset  . Diabetes Mother   . Cancer Mother     cervical cancer  . Heart disease Mother   . Diabetes Father     History  Substance Use Topics  . Smoking status: Passive Smoker  . Smokeless tobacco: Never Used  . Alcohol Use: Yes    OB History    Grav Para Term Preterm Abortions TAB SAB Ect Mult Living   1 0   1 1          Review of Systems  All other systems reviewed and are negative.    Allergies  Sulfa antibiotics and Sulfacetamide sodium  Home Medications   Current Outpatient Rx  Name Route Sig Dispense Refill  . AMANTADINE HCL 100 MG PO CAPS Oral Take 100 mg by mouth 2 (two) times daily.      [name] CALTRATE 600 PLUS-VIT D PO Oral Take 1 tablet by mouth daily.     [name] CLONAZEPAM 0.5 MG PO TABS Oral Take 0.5 mg by mouth 2 (two) times daily as needed. As needed for anxiety.    . INTERFERON BETA-1A 44 MCG/0.5ML [place] SOLN Subcutaneous Inject 44 mcg into the skin 3 (three) times a week.     . OXYBUTYNIN CHLORIDE 5 MG PO TABS Oral Take 5 mg by mouth daily.      [name] PANTOPRAZOLE SODIUM 40 MG PO TBEC  TAKE 1 TABLET DAILY 60 tablet 0    Must be seen for refills - last seen 07/2010  . PRASCION RA 10-5 % EX CREA Apply externally Apply 1 application topically daily.      . IBUPROFEN 800 MG PO TABS Oral Take 800 mg by mouth every 8 (eight) hours as needed. As needed for pain.      BP 120/91  Pulse 91  Temp(Src) 98.5 F (36.9 C) (Oral)  Resp 16  SpO2 96%  Physical Exam  Nursing note and vitals reviewed. Constitutional: She is oriented to person, place, and time. She appears well-developed and well-nourished. No distress.  HENT:  Head: Normocephalic and atraumatic.  Mouth/Throat: Mucous membranes are dry.  Eyes: Pupils are equal, round, and reactive to light.  Neck: Normal range of motion.  Cardiovascular: Normal rate and intact distal pulses.   Pulmonary/Chest: Effort normal and breath sounds normal. No respiratory distress.  Abdominal: Soft. Normal appearance. She exhibits no distension. There is no tenderness. There is no rebound.  Musculoskeletal: Normal range of motion.  Neurological: She is alert and oriented to person, place, and time. No cranial nerve deficit.  Skin: Skin is warm and dry. No rash noted.  Psychiatric: She has a normal mood and affect.  Her behavior is normal.    ED Course  Procedures (including critical care time)  Labs Reviewed  CBC - Abnormal; Notable for the following:    Platelets 130 (*)    All other components within normal limits  DIFFERENTIAL - Abnormal; Notable for the following:    Monocytes Relative 14 (*)    All other components within normal limits  POCT I-STAT, CHEM 8 - Abnormal; Notable for the following:    Potassium 3.4 (*)    Calcium, Ion 1.10 (*)    All other components within normal limits  CLOSTRIDIUM DIFFICILE BY PCR   No results found. Scheduled Meds:    . sodium chloride   Intravenous Once  . sodium chloride  1,000 mL Intravenous Once   Continuous Infusions:    . sodium chloride 999 mL/hr at 01/23/12 1945   PRN Meds:.acetaminophen, ondansetron (ZOFRAN) IV, zolpidem   1. Dehydration   2. Nausea vomiting and diarrhea       MDM          Nelia Shi, MD 01/28/12  2238

## 2012-01-24 MED ORDER — ONDANSETRON HCL 4 MG PO TABS: 4.0000 mg | ORAL_TABLET | Freq: Three times a day (TID) | ORAL | Status: AC | PRN

## 2012-01-24 NOTE — Discharge Instructions (Signed)
Please drink plenty of fluids over the next few days.  Followup with your primary care provider this week.  You may return to the emergency department at any time for worsening condition or any new symptoms that concern you.    Dehydration Dehydration is the reduction of water and fluid from the body to a level below that required for proper functioning. CAUSES  Dehydration occurs when there is excessive fluid loss from the body or when loss of normal fluids is not adequately replaced.  Loss of fluids occurs in vomiting, diarrhea, excessive sweating, excessive urine output, or excessive loss of fluid from the lungs (as occurs in fever or in patients on a ventilator).   Inadequate fluid replacement occurs with nausea or decreased appetite due to illness, sore throat, or mouth pain.  SYMPTOMS  Mild dehydration  Thirst (infants and young children may not be able to tell you they are thirsty).   Dry lips.   Slightly dry mouth membranes.  Moderate dehydration  Very dry mouth membranes.   Sunken eyes.   Sunken soft spot (fontanelle) on infant's head.   Skin does not bounce back quickly when lightly pinched and released.   Decreased urine production.   Decreased tear production.  Severe dehydration  Rapid, weak pulse (more than 100 beats per minute at rest).   Cold hands and feet.   Loss of ability to sweat in spite of heat and temperature.   Rapid breathing.   Blue lips.   Confusion, lethargy, difficult to arouse.   Minimal urine production.   No tears.  DIAGNOSIS  Your caregiver will diagnose dehydration based on your symptoms and your exam. Blood and urine tests will help confirm the diagnosis. The diagnostic evaluation should also identify the cause of dehydration. PREVENTION  The body depends on a proper balance of fluid and salts (electrolytes) for normal function. Adequate fluid intake in the presence of illness or other stresses (such as extreme exercise) is  important.  TREATMENT   Mild dehydration is safe to self-treat for most ages as long as it does not worsen. Contact your caregiver for even mild dehydration in infants and the elderly.   In teenagers and adults with moderate dehydration, careful home treatment (as outlined below) can be safe. Phone contact with a caregiver is advised. Children under 74 years of age with moderate dehydration should see a caregiver.   If you or your child is severely dehydrated, go to a hospital for treatment. Intravenous (IV) fluids will quickly reverse dehydration and are often lifesaving in young children, infants, and elderly persons.  HOME CARE INSTRUCTIONS  Small amounts of fluids should be taken frequently. Large amounts at one time may not be tolerated. Plain water may be harmful in infants and the elderly. Oral rehydration solutions (ORS) are available at pharmacies and grocery stores. ORS replaces water and important electrolytes in proper proportions. Sports drinks are not as effective as ORS and may be harmful because the sugar can make diarrhea worse.  As a general guideline for children, replace any new fluid losses from diarrhea and/or vomiting with ORS as follows:   If your child weighs 22 pounds or under (10 kg or less), give 60-120 mL (1/4-1/2 cup or 2-4 ounces) of ORS for each diarrheal stool or vomiting episode.   If your child weighs more than 22 pounds (more than 10 kg), give 120-240 mL (1/2-1 cup or 4-8 ounces) of ORS for each diarrheal stool or vomiting episode.   If your  child is vomiting, it may be helpful to give the above ORS replacement in 5 mL (1 teaspoon) amounts every 5 minutes and increase as tolerated.   While correcting for dehydration, children should eat normally. However, foods high in sugar should be avoided because they may worsen diarrhea. Large amounts of carbonated soft drinks, juice, gelatin desserts, and other highly sugared drinks should be avoided.   After correction  of dehydration, other liquids that are appealing to the child may be added. Children should drink small amounts of fluids frequently and fluids should be increased as tolerated. Children should drink enough fluids to keep urine clear or pale yellow.   Adults should eat normally while drinking more fluids than usual. Drink small amounts of fluids frequently and increase the amount as tolerated. Drink enough fluids to keep urine clear or pale yellow. Broths, weak decaffeinated tea, lemon-lime soft drinks (allowed to go flat), and ORS replace fluids and electrolytes.   Avoid:   Carbonated drinks.   Juice.   Extremely hot or cold fluids.   Caffeine drinks.   Fatty, greasy foods.   Alcohol.   Tobacco.   Too much intake of anything at one time.   Gelatin desserts.   Probiotics are active cultures of beneficial bacteria. They may lessen the amount and number of diarrheal stools in adults. Probiotics can be found in yogurt with active cultures and in supplements.   Wash your hands well to avoid spreading germs (bacteria) and viruses.   Antidiarrheal medicines are not recommended for infants and children.   Only take over-the-counter or prescription medicines for pain, discomfort, or fever as directed by your caregiver. Do not give aspirin to children.   For adults with dehydration, ask your caregiver if you should continue all prescribed and over-the-counter medicines.   If your caregiver has given you a follow-up appointment, it is very important to keep that appointment. Not keeping the appointment could result in a lasting (chronic) or permanent injury and disability. If there is any problem keeping the appointment, you must call to reschedule.  SEEK IMMEDIATE MEDICAL CARE IF:   You are unable to keep fluids down or other symptoms become worse despite treatment.   Vomiting or diarrhea develops and becomes persistent.   There is vomiting of blood or green matter (bile).   There  is blood in the stool or the stools are black and tarry.   There is no urine output in 6 to 8 hours or there is only a small amount of very dark urine.   Abdominal pain develops, increases, or localizes.   You or your child has an oral temperature above 102 F (38.9 C), not controlled by medicine.   Your baby is older than 3 months with a rectal temperature of 102.21F (38.9 C) or higher.   Your baby is 11 months old or younger with a rectal temperature of 100.4 F (38 C) or higher.   You develop excessive weakness, dizziness, fainting, or extreme thirst.   You develop a rash, stiff neck, severe headache, or you become irritable, sleepy, or difficult to awaken.  MAKE SURE YOU:   Understand these instructions.   Will watch your condition.   Will get help right away if you are not doing well or get worse.  Document Released: 11/26/2005 Document Revised: 06/11/2011 Document Reviewed: 10/25/2009 [facility] Patient Information 2012 [place], [place].B.R.A.T. Diet Your doctor has recommended the B.R.A.T. diet for you or your child until the condition improves. This is often used  to help control diarrhea and vomiting symptoms. If you or your child can tolerate clear liquids, you may have:  Bananas.   Rice.   Applesauce.   Toast (and other simple starches such as crackers, potatoes, noodles).  Be sure to avoid dairy products, meats, and fatty foods until symptoms are better. Fruit juices such as apple, grape, and prune juice can make diarrhea worse. Avoid these. Continue this diet for 2 days or as instructed by your caregiver. Document Released: 11/26/2005 Document Revised: 08/08/2011 Document Reviewed: 05/15/2007 [facility] Patient Information 2012 [place], [place].B.R.A.T. Diet Your doctor has recommended the B.R.A.T. diet for you or your child until the condition improves. This is often used to help control diarrhea and vomiting symptoms. If you or your child can tolerate clear liquids, you  may have:  Bananas.   Rice.   Applesauce.   Toast (and other simple starches such as crackers, potatoes, noodles).  Be sure to avoid dairy products, meats, and fatty foods until symptoms are better. Fruit juices such as apple, grape, and prune juice can make diarrhea worse. Avoid these. Continue this diet for 2 days or as instructed by your caregiver. Document Released: 11/26/2005 Document Revised: 08/08/2011 Document Reviewed: 05/15/2007 [facility] Patient Information 2012 [place], [place].

## 2012-01-24 NOTE — ED Notes (Signed)
Spoke with patient's sister and arranged transport home after discharge.

## 2012-01-24 NOTE — ED Provider Notes (Signed)
8:18 AM patient is in CDU under observation-dehydration protocol.  Per nursing, patient has had no episodes of vomiting, or diarrhea.  In the CDU.  Patient reports she is feeling much better, think she will be a will to tolerate fluids and agrees with discharge home.  There was no sign out available this morning and nothing in the note to suggest she needs any treatment beside standard dehydration protocol. Pt diagnosed with N/V/D and weakness. Patient is tolerating PO fluids and states she is ready for discharge home.  Plan is for d/c home with PCP follow up.    Rise Patience, [place] 01/24/12 1003

## 2012-01-28 NOTE — ED Provider Notes (Signed)
Medical screening examination/treatment/procedure(s) were performed by non-physician practitioner and as supervising physician I was immediately available for consultation/collaboration.    Nelia Shi, MD 01/28/12 2159

## 2012-02-05 ENCOUNTER — Ambulatory Visit (INDEPENDENT_AMBULATORY_CARE_PROVIDER_SITE_OTHER): Payer: No Typology Code available for payment source | Admitting: Internal Medicine

## 2012-02-05 ENCOUNTER — Encounter: Payer: Self-pay | Admitting: Internal Medicine

## 2012-02-05 DIAGNOSIS — R5381 Other malaise: Secondary | ICD-10-CM

## 2012-02-05 DIAGNOSIS — G35 Multiple sclerosis: Secondary | ICD-10-CM

## 2012-02-05 DIAGNOSIS — R269 Unspecified abnormalities of gait and mobility: Secondary | ICD-10-CM

## 2012-02-05 DIAGNOSIS — R42 Dizziness and giddiness: Secondary | ICD-10-CM

## 2012-02-05 DIAGNOSIS — R5383 Other fatigue: Secondary | ICD-10-CM

## 2012-02-05 MED ORDER — PROMETHAZINE HCL 12.5 MG PO TABS: 12.5000 mg | ORAL_TABLET | Freq: Three times a day (TID) | ORAL | Status: AC | PRN

## 2012-02-05 MED ORDER — CLONAZEPAM 0.5 MG PO TABS: 0.5000 mg | ORAL_TABLET | Freq: Two times a day (BID) | ORAL | Status: DC | PRN

## 2012-02-05 NOTE — Progress Notes (Signed)
  Subjective:    Patient ID: Stephanie Zhang, female    DOB: [birth year], 51 y.o.   MRN: [id number]  HPI  51 year old patient who is seen today for followup. She has a history of MS with cerebellar ataxia. She has been on chronic interferon therapy. She was seen in the ED 13 days ago due to weakness nausea and dizziness. She was treated with IV fluids at that time. Presently has been living with her mother due to the weakness dizziness and nausea her appetite has been stable and her by mouth intake adequate. There's been no vomiting. She is tolerating solids and liquids but remains weak with the dizziness and a sense of disequilibrium. There's been some associated nausea. No fever or diarrhea    Review of Systems  Constitutional: Negative.   HENT: Negative for hearing loss, congestion, sore throat, rhinorrhea, dental problem, sinus pressure and tinnitus.   Eyes: Negative for pain, discharge and visual disturbance.  Respiratory: Negative for cough and shortness of breath.   Cardiovascular: Negative for chest pain, palpitations and leg swelling.  Gastrointestinal: Positive for nausea. Negative for vomiting, abdominal pain, diarrhea, constipation, blood in stool and abdominal distention.  Genitourinary: Negative for dysuria, urgency, frequency, hematuria, flank pain, vaginal bleeding, vaginal discharge, difficulty urinating, vaginal pain and pelvic pain.  Musculoskeletal: Positive for gait problem. Negative for joint swelling and arthralgias.  Skin: Negative for rash.  Neurological: Positive for light-headedness. Negative for dizziness, syncope, speech difficulty, weakness, numbness and headaches.  Hematological: Negative for adenopathy.  Psychiatric/Behavioral: Negative for behavioral problems, dysphoric mood and agitation. The patient is not nervous/anxious.        Objective:   Physical Exam  Constitutional: She is oriented to person, place, and time. She appears well-developed and  well-nourished.  HENT:  Head: Normocephalic.  Right Ear: External ear normal.  Left Ear: External ear normal.  Mouth/Throat: Oropharynx is clear and moist.       Tympanic membranes normal  Eyes: Conjunctivae and EOM are normal. Pupils are equal, round, and reactive to light.  Neck: Normal range of motion. Neck supple. No thyromegaly present.  Cardiovascular: Normal rate, regular rhythm, normal heart sounds and intact distal pulses.   Pulmonary/Chest: Effort normal and breath sounds normal.  Abdominal: Soft. Bowel sounds are normal. She exhibits no mass. There is no tenderness.  Musculoskeletal: Normal range of motion.  Lymphadenopathy:    She has no cervical adenopathy.  Neurological: She is alert and oriented to person, place, and time. Coordination abnormal.       Poor finger to nose testing left worse than right  No nystagmus  Skin: Skin is warm and dry. No rash noted.  Psychiatric: She has a normal mood and affect. Her behavior is normal.          Assessment & Plan:   Vertigo nausea weakness. We'll continue Klonopin which she is on chronically we'll treat with Phenergan. Followup neurology

## 2012-02-05 NOTE — Patient Instructions (Signed)
Take medication for nausea/vertigo every 6 hours  Call or return to clinic prn if these symptoms worsen or fail to improve as anticipated.

## 2012-04-16 ENCOUNTER — Observation Stay (HOSPITAL_COMMUNITY): Payer: No Typology Code available for payment source

## 2012-04-16 ENCOUNTER — Encounter (HOSPITAL_COMMUNITY): Payer: Self-pay | Admitting: *Deleted

## 2012-04-16 ENCOUNTER — Inpatient Hospital Stay (HOSPITAL_COMMUNITY)
Admission: EM | Admit: 2012-04-16 | Discharge: 2012-04-18 | DRG: 392 | Disposition: A | Discharge status: Nursing Home (Includes ICF) | Payer: No Typology Code available for payment source | Source: Ambulatory Visit | Attending: Internal Medicine | Admitting: Internal Medicine

## 2012-04-16 DIAGNOSIS — Z87891 Personal history of nicotine dependence: Secondary | ICD-10-CM

## 2012-04-16 DIAGNOSIS — E440 Moderate protein-calorie malnutrition: Secondary | ICD-10-CM

## 2012-04-16 DIAGNOSIS — R197 Diarrhea, unspecified: Secondary | ICD-10-CM

## 2012-04-16 DIAGNOSIS — F341 Dysthymic disorder: Secondary | ICD-10-CM | POA: Diagnosis present

## 2012-04-16 DIAGNOSIS — Z8719 Personal history of other diseases of the digestive system: Secondary | ICD-10-CM

## 2012-04-16 DIAGNOSIS — J069 Acute upper respiratory infection, unspecified: Secondary | ICD-10-CM

## 2012-04-16 DIAGNOSIS — R5381 Other malaise: Secondary | ICD-10-CM

## 2012-04-16 DIAGNOSIS — G35 Multiple sclerosis: Secondary | ICD-10-CM | POA: Diagnosis present

## 2012-04-16 DIAGNOSIS — R627 Adult failure to thrive: Secondary | ICD-10-CM

## 2012-04-16 DIAGNOSIS — R64 Cachexia: Secondary | ICD-10-CM | POA: Diagnosis present

## 2012-04-16 DIAGNOSIS — K3184 Gastroparesis: Principal | ICD-10-CM | POA: Diagnosis present

## 2012-04-16 DIAGNOSIS — R269 Unspecified abnormalities of gait and mobility: Secondary | ICD-10-CM

## 2012-04-16 DIAGNOSIS — C801 Malignant (primary) neoplasm, unspecified: Secondary | ICD-10-CM

## 2012-04-16 DIAGNOSIS — E162 Hypoglycemia, unspecified: Secondary | ICD-10-CM

## 2012-04-16 DIAGNOSIS — K219 Gastro-esophageal reflux disease without esophagitis: Secondary | ICD-10-CM | POA: Diagnosis present

## 2012-04-16 DIAGNOSIS — Z882 Allergy status to sulfonamides status: Secondary | ICD-10-CM

## 2012-04-16 DIAGNOSIS — G35D Multiple sclerosis, unspecified: Secondary | ICD-10-CM

## 2012-04-16 DIAGNOSIS — K5289 Other specified noninfective gastroenteritis and colitis: Secondary | ICD-10-CM

## 2012-04-16 DIAGNOSIS — Z8249 Family history of ischemic heart disease and other diseases of the circulatory system: Secondary | ICD-10-CM

## 2012-04-16 DIAGNOSIS — K529 Noninfective gastroenteritis and colitis, unspecified: Secondary | ICD-10-CM

## 2012-04-16 DIAGNOSIS — R5383 Other fatigue: Secondary | ICD-10-CM

## 2012-04-16 DIAGNOSIS — R6251 Failure to thrive (child): Secondary | ICD-10-CM

## 2012-04-16 DIAGNOSIS — Z853 Personal history of malignant neoplasm of breast: Secondary | ICD-10-CM

## 2012-04-16 DIAGNOSIS — Z923 Personal history of irradiation: Secondary | ICD-10-CM

## 2012-04-16 DIAGNOSIS — Z833 Family history of diabetes mellitus: Secondary | ICD-10-CM

## 2012-04-16 DIAGNOSIS — E46 Unspecified protein-calorie malnutrition: Secondary | ICD-10-CM

## 2012-04-16 DIAGNOSIS — Z9221 Personal history of antineoplastic chemotherapy: Secondary | ICD-10-CM

## 2012-04-16 DIAGNOSIS — Z8049 Family history of malignant neoplasm of other genital organs: Secondary | ICD-10-CM

## 2012-04-16 DIAGNOSIS — D869 Sarcoidosis, unspecified: Secondary | ICD-10-CM | POA: Diagnosis present

## 2012-04-16 DIAGNOSIS — M810 Age-related osteoporosis without current pathological fracture: Secondary | ICD-10-CM | POA: Diagnosis present

## 2012-04-16 DIAGNOSIS — R634 Abnormal weight loss: Secondary | ICD-10-CM

## 2012-04-16 HISTORY — DX: Headache: R51

## 2012-04-16 HISTORY — DX: Other amnesia: R41.3

## 2012-04-16 HISTORY — DX: Anxiety disorder, unspecified: F41.9

## 2012-04-16 HISTORY — DX: Dorsalgia, unspecified: M54.9

## 2012-04-16 HISTORY — DX: Major depressive disorder, single episode, unspecified: F32.9

## 2012-04-16 HISTORY — DX: Encounter for other specified aftercare: Z51.89

## 2012-04-16 HISTORY — DX: Other chronic pain: G89.29

## 2012-04-16 HISTORY — DX: Reserved for inherently not codable concepts without codable children: IMO0001

## 2012-04-16 HISTORY — DX: Depression, unspecified: F32.A

## 2012-04-16 HISTORY — DX: Disorientation, unspecified: R41.0

## 2012-04-16 LAB — DIFFERENTIAL
Basophils Relative: 0 % (ref 0–1)
Eosinophils Absolute: 0 10*3/uL (ref 0.0–0.7)
Eosinophils Relative: 1 % (ref 0–5)
Monocytes Absolute: 0.4 10*3/uL (ref 0.1–1.0)
Monocytes Relative: 11 % (ref 3–12)
Neutrophils Relative %: 69 % (ref 43–77)

## 2012-04-16 LAB — URINALYSIS, ROUTINE W REFLEX MICROSCOPIC
Leukocytes, UA: NEGATIVE
Nitrite: NEGATIVE
Specific Gravity, Urine: 1.027 (ref 1.005–1.030)
Urobilinogen, UA: 0.2 mg/dL (ref 0.0–1.0)
pH: 5 (ref 5.0–8.0)

## 2012-04-16 LAB — CBC
Hemoglobin: 13.4 g/dL (ref 12.0–15.0)
MCH: 30.7 pg (ref 26.0–34.0)
MCHC: 34.6 g/dL (ref 30.0–36.0)
MCV: 88.6 fL (ref 78.0–100.0)
MCV: 88.7 fL (ref 78.0–100.0)
Platelets: 132 10*3/uL — ABNORMAL LOW (ref 150–400)
RBC: 4.16 MIL/uL (ref 3.87–5.11)
WBC: 3.2 10*3/uL — ABNORMAL LOW (ref 4.0–10.5)

## 2012-04-16 LAB — COMPREHENSIVE METABOLIC PANEL
Albumin: 3.4 g/dL — ABNORMAL LOW (ref 3.5–5.2)
BUN: 15 mg/dL (ref 6–23)
Calcium: 9.2 mg/dL (ref 8.4–10.5)
Creatinine, Ser: 0.57 mg/dL (ref 0.50–1.10)
GFR calc Af Amer: >90 mL/min (ref >90)
Potassium: 3.9 mEq/L (ref 3.5–5.1)
Total Protein: 6.7 g/dL (ref 6.0–8.3)

## 2012-04-16 LAB — CREATININE, SERUM
Creatinine, Ser: 0.67 mg/dL (ref 0.50–1.10)
GFR calc Af Amer: >90 mL/min (ref >90)

## 2012-04-16 LAB — PHOSPHORUS: Phosphorus: 2.8 mg/dL (ref 2.3–4.6)

## 2012-04-16 LAB — LIPASE, BLOOD: Lipase: 29 U/L (ref 11–59)

## 2012-04-16 MED ORDER — MORPHINE SULFATE 2 MG/ML IJ SOLN: 2.0000 mg | INTRAMUSCULAR | Status: DC | PRN

## 2012-04-16 MED ORDER — IOHEXOL 300 MG/ML  SOLN
20.0000 mL | INTRAMUSCULAR | Status: AC
  Administered 2012-04-16 (×2): 20 mL via ORAL

## 2012-04-16 MED ORDER — AMANTADINE HCL 100 MG PO CAPS
100.0000 mg | ORAL_CAPSULE | Freq: Two times a day (BID) | ORAL | Status: DC
  Administered 2012-04-16 – 2012-04-18 (×4): 100 mg via ORAL
  Filled 2012-04-16 (×6): qty 1

## 2012-04-16 MED ORDER — ONDANSETRON HCL 4 MG/2ML IJ SOLN: 4.0000 mg | Freq: Four times a day (QID) | INTRAMUSCULAR | Status: DC | PRN

## 2012-04-16 MED ORDER — SODIUM CHLORIDE 0.9 % IV BOLUS (SEPSIS)
1000.0000 mL | Freq: Once | INTRAVENOUS | Status: AC
  Administered 2012-04-16: 1000 mL via INTRAVENOUS

## 2012-04-16 MED ORDER — INTERFERON BETA-1A 44 MCG/0.5ML ~~LOC~~ SOLN
44.0000 ug | SUBCUTANEOUS | Status: DC
  Administered 2012-04-16 – 2012-04-18 (×2): 44 ug via SUBCUTANEOUS
  Filled 2012-04-16: qty 0.5

## 2012-04-16 MED ORDER — CLONAZEPAM 0.5 MG PO TABS
0.5000 mg | ORAL_TABLET | Freq: Two times a day (BID) | ORAL | Status: DC | PRN
Start: 2012-04-16 — End: 2012-04-18

## 2012-04-16 MED ORDER — INTERFERON BETA-1A 44 MCG/0.5ML ~~LOC~~ SOLN
44.0000 ug | SUBCUTANEOUS | Status: DC
  Filled 2012-04-16: qty 0.5

## 2012-04-16 MED ORDER — OXYBUTYNIN CHLORIDE 5 MG PO TABS
5.0000 mg | ORAL_TABLET | Freq: Every day | ORAL | Status: DC
  Administered 2012-04-16 – 2012-04-18 (×3): 5 mg via ORAL
  Filled 2012-04-16 (×3): qty 1

## 2012-04-16 MED ORDER — ONDANSETRON HCL 4 MG PO TABS: 4.0000 mg | ORAL_TABLET | Freq: Four times a day (QID) | ORAL | Status: DC | PRN

## 2012-04-16 MED ORDER — HEPARIN SODIUM (PORCINE) 5000 UNIT/ML IJ SOLN
5000.0000 [IU] | Freq: Three times a day (TID) | INTRAMUSCULAR | Status: DC
  Administered 2012-04-16 – 2012-04-18 (×6): 5000 [IU] via SUBCUTANEOUS
  Filled 2012-04-16 (×9): qty 1

## 2012-04-16 MED ORDER — ACETAMINOPHEN 650 MG RE SUPP: 650.0000 mg | Freq: Four times a day (QID) | RECTAL | Status: DC | PRN

## 2012-04-16 MED ORDER — ACETAMINOPHEN 325 MG PO TABS: 650.0000 mg | ORAL_TABLET | Freq: Four times a day (QID) | ORAL | Status: DC | PRN

## 2012-04-16 MED ORDER — SODIUM CHLORIDE 0.9 % IV SOLN: INTRAVENOUS | Status: DC

## 2012-04-16 MED ORDER — SODIUM CHLORIDE 0.9 % IV SOLN
INTRAVENOUS | Status: DC
  Administered 2012-04-16 – 2012-04-17 (×4): via INTRAVENOUS

## 2012-04-16 MED ORDER — OXYCODONE HCL 5 MG PO TABS: 5.0000 mg | ORAL_TABLET | ORAL | Status: DC | PRN

## 2012-04-16 MED ORDER — ONDANSETRON HCL 4 MG/2ML IJ SOLN: 4.0000 mg | Freq: Three times a day (TID) | INTRAMUSCULAR | Status: AC | PRN

## 2012-04-16 NOTE — H&P (Signed)
PCP:   Rogelia Boga, MD, MD   Chief Complaint:  Vomiting/diarrhea for several days. Failure to thrive.  HPI: This is a 51 year old female, with known history of multiple sclerosis, diagnosed at age 61 years, under care of Dr Conrad Valdez[place] at [facility], right breat cancer 10/2001, s/p partial mastectomy, chemotherapy and radiation therapy, under care of Dr Truett Perna and Dr Kathrynn Running (Delared disease-free in 11/2011), sarcoidosis, diagnosed via mediastinoscopy/biopsy 01/01/2002, GERD, Depression, anxiety, chronic back pain, presenting with above symptoms. Per family, patient had anorexia nervosa, diagnosed before her MS diagnosis. History was obtained from patient's two sisters, who were present in the ED, because of patient's problems with short term memory, which sisters insist has become much worse, over the last 6 months. It appears that patient has had poor appetite, and not been eating well for about a year, and in that time, has lost 30-40 pounds. She was widowed about a year ago, lives alone with her pets, is unkempt, and appears quite unable to take care of herself. Her sisters take her grocery shopping, and ensure that she has a good amount of food at home, but patient insists on doing her own cooking, and they feel that she often forgets to cook or eat. They sometimes take her out to eat, about once a week, and patient gets diarrhea afterwards. This has been going on for several months. Family has made attempts to get her into an assisted living facitliy, but patient is not agreeable. She has had severe nausea for 2 days, without vomiting, and with mild abdominal discomfort, and this morning, had watery stools about 3-4 times.  Allergies:   Allergies  Allergen Reactions  . Sulfa Antibiotics Itching and Rash    "long time since I had breakout"  . Sulfacetamide Sodium Itching and Rash      Past Medical History  Diagnosis Date  . MS (multiple sclerosis) 1987  . Hypoglycemia   .  Breast cancer 10/2001    Stage II (right side)  . Multiple sclerosis   . GERD (gastroesophageal reflux disease)   . Osteoporosis   . Anxiety   . Blood transfusion     "when I was born"  . Headache     "lots"  . Chronic back pain   . Depression   . Confusion   . Short-term memory loss     Past Surgical History  Procedure Date  . Breast lumpectomy 2002    right  . Breast biopsy 2002    right    Prior to Admission medications   Medication Sig Start Date End Date Taking? Authorizing Provider  amantadine (SYMMETREL) 100 MG capsule Take 100 mg by mouth 2 (two) times daily.     Yes Historical Provider, MD  Calcium-Vitamin D (CALTRATE 600 PLUS-VIT D PO) Take 1 tablet by mouth daily.    Yes Historical Provider, MD  clonazePAM (KLONOPIN) 0.5 MG tablet Take 1 tablet (0.5 mg total) by mouth 2 (two) times daily as needed. As needed for anxiety. 02/05/12  Yes Gordy Savers, MD  ibuprofen (ADVIL,MOTRIN) 800 MG tablet Take 800 mg by mouth every 8 (eight) hours as needed. As needed for pain.   Yes Historical Provider, MD  interferon beta-1a (REBIF) 44 MCG/0.5ML injection Inject 44 mcg into the skin 3 (three) times a week.    Yes Historical Provider, MD  oxybutynin (DITROPAN) 5 MG tablet Take 5 mg by mouth daily.     Yes Historical Provider, MD  pantoprazole (PROTONIX) 40  MG tablet TAKE 1 TABLET DAILY 08/14/11  Yes Gordy Savers, MD    Social History: Patient works as a Haematologist, ambulates with a walker, and utilizes a wheelchair. She is widowed since June 2012, has no offspring, has one brother and 3 sisters. She reports that she quit smoking about 11 months ago. She has never used smokeless tobacco. She reports that she drinks alcohol. She reports that she uses illicit drugs (LSD, Marijuana, and "Crack" cocaine).  Family History  Problem Relation Age of Onset  . Diabetes Mother   . Cancer Mother     cervical cancer  . Heart disease Mother   . Diabetes Father     Review of  Systems:  As per HPI and chief complaint. Patent denies fever, chills, headache, blurred vision, difficulty in speaking, dysphagia, chest pain, cough, shortness of breath, orthopnea, paroxysmal nocturnal dyspnea, nausea, diaphoresis, vomiting, belching, heartburn, hematemesis, melena, dysuria, nocturia, urinary frequency, hematochezia, lower extremity swelling, pain, or redness. The rest of the systems review is negative.  Physical Exam:  General:  Patient does not appear to be in obvious acute distress. Markedly underweight, alert, communicative, fully oriented, has very poor short-term memory, eating voraciously at the time of this evaluation, talking in complete sentences, not short of breath at rest. Hydration status appears fair. HEENT:  No clinical pallor, no jaundice, no conjunctival injection or discharge. NECK:  Supple, JVP not seen, no carotid bruits, no palpable lymphadenopathy, no palpable goiter. CHEST:  Clinically clear to auscultation, no wheezes, no crackles. HEART:  Sounds 1 and 2 heard, normal, regular, no murmurs. ABDOMEN:  Scaphoid, soft, non-tender, no palpable organomegaly, no palpable masses, normal bowel sounds. GENITALIA:  Not examined. LOWER EXTREMITIES:  No pitting edema, palpable peripheral pulses. MUSCULOSKELETAL SYSTEM:  Generalized osteoarthritic changes, otherwise, normal. CENTRAL NERVOUS SYSTEM:  Moving all limbs, tremulous, left arm appears much weaker than right.  Labs on Admission:  Results for orders placed during the hospital encounter of 04/16/12 (from the past 48 hour(s))  CBC     Status: Abnormal   Collection Time   04/16/12 10:25 AM      Component Value Range Comment   WBC 4.0  4.0 - 10.5 (K/uL)    RBC 4.37  3.87 - 5.11 (MIL/uL)    Hemoglobin 13.4  12.0 - 15.0 (g/dL)    HCT 16.1  09.6 - 04.5 (%)    MCV 88.6  78.0 - 100.0 (fL)    MCH 30.7  26.0 - 34.0 (pg)    MCHC 34.6  30.0 - 36.0 (g/dL)    RDW 40.9  81.1 - 91.4 (%)    Platelets 147 (*) 150 - 400  (K/uL)   DIFFERENTIAL     Status: Normal   Collection Time   04/16/12 10:25 AM      Component Value Range Comment   Neutrophils Relative 69  43 - 77 (%)    Neutro Abs 2.8  1.7 - 7.7 (K/uL)    Lymphocytes Relative 20  12 - 46 (%)    Lymphs Abs 0.8  0.7 - 4.0 (K/uL)    Monocytes Relative 11  3 - 12 (%)    Monocytes Absolute 0.4  0.1 - 1.0 (K/uL)    Eosinophils Relative 1  0 - 5 (%)    Eosinophils Absolute 0.0  0.0 - 0.7 (K/uL)    Basophils Relative 0  0 - 1 (%)    Basophils Absolute 0.0  0.0 - 0.1 (K/uL)   COMPREHENSIVE METABOLIC  PANEL     Status: Abnormal   Collection Time   04/16/12 10:25 AM      Component Value Range Comment   Sodium 138  135 - 145 (mEq/L)    Potassium 3.9  3.5 - 5.1 (mEq/L) HEMOLYSIS AT THIS LEVEL MAY AFFECT RESULT   Chloride 103  96 - 112 (mEq/L)    CO2 24  19 - 32 (mEq/L)    Glucose, Bld 83  70 - 99 (mg/dL)    BUN 15  6 - 23 (mg/dL)    Creatinine, Ser 1.61  0.50 - 1.10 (mg/dL)    Calcium 9.2  8.4 - 10.5 (mg/dL)    Total Protein 6.7  6.0 - 8.3 (g/dL)    Albumin 3.4 (*) 3.5 - 5.2 (g/dL)    AST 24  0 - 37 (U/L) HEMOLYSIS AT THIS LEVEL MAY AFFECT RESULT   ALT 12  0 - 35 (U/L)    Alkaline Phosphatase 98  39 - 117 (U/L)    Total Bilirubin 1.1  0.3 - 1.2 (mg/dL)    GFR calc non Af Amer >90  >90 (mL/min)    GFR calc Af Amer >90  >90 (mL/min)   LIPASE, BLOOD     Status: Normal   Collection Time   04/16/12 10:25 AM      Component Value Range Comment   Lipase 29  11 - 59 (U/L)   URINALYSIS, ROUTINE W REFLEX MICROSCOPIC     Status: Abnormal   Collection Time   04/16/12 10:37 AM      Component Value Range Comment   Color, Urine AMBER (*) YELLOW  BIOCHEMICALS MAY BE AFFECTED BY COLOR   APPearance CLEAR  CLEAR     Specific Gravity, Urine 1.027  1.005 - 1.030     pH 5.0  5.0 - 8.0     Glucose, UA NEGATIVE  NEGATIVE (mg/dL)    Hgb urine dipstick NEGATIVE  NEGATIVE     Bilirubin Urine SMALL (*) NEGATIVE     Ketones, ur 15 (*) NEGATIVE (mg/dL)    Protein, ur NEGATIVE   NEGATIVE (mg/dL)    Urobilinogen, UA 0.2  0.0 - 1.0 (mg/dL)    Nitrite NEGATIVE  NEGATIVE     Leukocytes, UA NEGATIVE  NEGATIVE  MICROSCOPIC NOT DONE ON URINES WITH NEGATIVE PROTEIN, BLOOD, LEUKOCYTES, NITRITE, OR GLUCOSE <1000 mg/dL.    Radiological Exams on Admission: No results found.  Assessment/Plan Principal Problem:  *FTT (failure to thrive) in adult:  This is long-standing, possibly secondary to MS/depression. Patient has lost considerable weight in the past one year, although she shows very good appetite in the ED today. We shall work up with CXR, check TSH and request nutritionist consultation. Active Problems:  1. Weight loss, unintentional: See discussion above. Although patient clearly has an obvious etiology for her weight loss, history also appears to suggest some change in bowel habit. Will arrange abdominal CT scan, to rule out a sinister etiology.  2. Malnutrition:This moderate, as albumin is 3.4, and caused by FTT. Await nutritionist recommendations.  3. Diarrhea: This occurred this morning. Etiology is unclear, but appears to be recurrent, for at lease 6 months or more. IV fluids will be administered for now, and stool studies done.  4. Sarcoidosis: Hitherto, stable. Calcium is normal. Will check CXR.  5. Multiple sclerosis: Diagnosed at age 1 years, and currently on Interferon Beta, which we shall continue. Patient is still under the care of Dr Conrad [place] at [facility].  Comment: Patient resides  alone, and family feel that she is unable to cope, despite support from them PT/OT evaluation will be needed, to determine appropriate disposition. Psychiatric consultation will be requested on 04/17/12, to assess capacity.  Time Spent on Admission: 45 mins.  Trentyn Boisclair,CHRISTOPHER 04/16/2012, 3:19 PM

## 2012-04-16 NOTE — ED Notes (Signed)
5530-01 Ready 

## 2012-04-16 NOTE — Progress Notes (Signed)
Spoke with family about Rebif. Medication not available in the pharmacy. Sister said she will bring Rebif. Driggers, [employer]

## 2012-04-16 NOTE — ED Notes (Signed)
Pt with very poor eye contact in triage, with much questioning was able to tell me that she has had nausea and diarrhea for several days, reports laying in bed a lot. Pt appears very unkempt, family states pt lives by herself and has not been taking care of herself. Denies abdominal pain.

## 2012-04-16 NOTE — Progress Notes (Signed)
Met with patient and her sister to discuss home care concerns with potential for placement. Due to patient's declining condition she has not taken care of herself as she had in the past, is not eating appropriately and forgets to eat, does not take the dog out but cleans up after it goes inside. 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 Her sister provides transportation. Her sister would like for the patient to be placed in an assisted living facility. Patient states [place] can[place] do [place] and three cats. I asked if anyone was the patient's POA and sister stated that she had POA only for financial. However, the patient spoke up and stated that her husband has POA; we reminded the patient that he was deceased. I offered the use of home health services and Meals on Wheels but the patient was against this. Sister did finally state that maybe patient could stay with her for a couple weeks. Since the sisters are in disagreement, they have asked to be left alone for a private conversation and will call upon me when they are ready. At this time the tech also advised me that the patient had significant difficulty with ambulation to the restroom, her knees buckling, and she almost fell (patient was assisted the entire time by the tech). I have advised the PA.

## 2012-04-16 NOTE — ED Notes (Signed)
Pt's right earring backing is imbedded in the back of her ear.  Other jewelry removed and given to family.

## 2012-04-16 NOTE — ED Provider Notes (Signed)
History     CSN: [id number]  Arrival date & time 04/16/12  0902   First MD Initiated Contact with Patient 04/16/12 [phone]      Chief Complaint  Patient presents with  . Nausea  . Diarrhea    (Consider location/radiation/quality/duration/timing/severity/associated sxs/prior treatment) HPI History provided by pt and a family member.  Pt presents w/ nausea and diarrhea.  She believes the diarrhea started early this morning and is unsure of how many episodes she had as well as wether or not there was blood in her stool.  Associated w/ diffuse, mild, crampy abd pain.  Denies fever and GU sx.  No h/o abd surgeries.  Her family member says that pt became disoriented yesterday.  Describes as short-term memory impairment.  For example, she could not remember how to punch into time clock at work.  It is not uncommon for her to become disoriented when she is sick.  She had multiple episodes of diarrhea this morning and was incontinent of stool in her bed.  No vomiting.  She is concerned that patient is not taking care of herself.  She has a h/o MS and breast cancer.  Her husband and primary care giver died approx 1 year ago and pt insisted on living by herself.  She has lost approx 30-40lbs over last year.  Pt admits to not eating regular meals at home.    Past Medical History  Diagnosis Date  . MS (multiple sclerosis) 1987  . Hypoglycemia   . Breast cancer 10/2001    Stage II (right side)  . Multiple sclerosis   . GERD (gastroesophageal reflux disease)   . Osteoporosis   . Anxiety     Past Surgical History  Procedure Date  . Breast surgery 2002    rt breast lumpectomy    Family History  Problem Relation Age of Onset  . Diabetes Mother   . Cancer Mother     cervical cancer  . Heart disease Mother   . Diabetes Father     History  Substance Use Topics  . Smoking status: Passive Smoker  . Smokeless tobacco: Never Used  . Alcohol Use: Yes    OB History    Grav Para Term Preterm  Abortions TAB SAB Ect Mult Living   1 0   1 1          Review of Systems  All other systems reviewed and are negative.    Allergies  Sulfa antibiotics and Sulfacetamide sodium  Home Medications   Current Outpatient Rx  Name Route Sig Dispense Refill  . AMANTADINE HCL 100 MG PO CAPS Oral Take 100 mg by mouth 2 (two) times daily.      [name] CALTRATE 600 PLUS-VIT D PO Oral Take 1 tablet by mouth daily.     [name] CLONAZEPAM 0.5 MG PO TABS Oral Take 1 tablet (0.5 mg total) by mouth 2 (two) times daily as needed. As needed for anxiety. 30 tablet 4  . IBUPROFEN 800 MG PO TABS Oral Take 800 mg by mouth every 8 (eight) hours as needed. As needed for pain.    . INTERFERON BETA-1A 44 MCG/0.5ML Holt SOLN Subcutaneous Inject 44 mcg into the skin 3 (three) times a week.     . OXYBUTYNIN CHLORIDE 5 MG PO TABS Oral Take 5 mg by mouth daily.      [name] PANTOPRAZOLE SODIUM 40 MG PO TBEC  TAKE 1 TABLET DAILY 60 tablet 0    Must be seen for  refills - last seen 07/2010    BP 116/87  Pulse 73  Temp(Src) 98.4 F (36.9 C) (Oral)  Resp 16  SpO2 100%  Physical Exam  Nursing note and vitals reviewed. Constitutional: She is oriented to person, place, and time. She appears well-developed and well-nourished. No distress.       Cachectic   HENT:  Head: Normocephalic and atraumatic.  Mouth/Throat: Oropharynx is clear and moist.       No lesions of buccal mucosa  Eyes:       Normal appearance  Neck: Normal range of motion.  Cardiovascular: Normal rate and regular rhythm.   Pulmonary/Chest: Effort normal and breath sounds normal. No respiratory distress.  Abdominal: Soft. Bowel sounds are normal. She exhibits no distension and no mass. There is no rebound and no guarding.       Reports mild, diffuse tenderness but does not appear uncomfortable w/ palpation  Genitourinary:       No CVA tenderness  Musculoskeletal: Normal range of motion.  Lymphadenopathy:    She has cervical adenopathy.  Neurological: She is  alert and oriented to person, place, and time.       CN 3-12 intact.  No sensory deficits.  5/5 and equal upper and lower extremity strength.  No past pointing.   Skin: Skin is warm and dry. No rash noted.       Mildly jaundiced  Psychiatric: She has a normal mood and affect. Her behavior is normal.    ED Course  Procedures (including critical care time)  Labs Reviewed  CBC - Abnormal; Notable for the following:    Platelets 147 (*)    All other components within normal limits  COMPREHENSIVE METABOLIC PANEL - Abnormal; Notable for the following:    Albumin 3.4 (*)    All other components within normal limits  URINALYSIS, ROUTINE W REFLEX MICROSCOPIC - Abnormal; Notable for the following:    Color, Urine AMBER (*) BIOCHEMICALS MAY BE AFFECTED BY COLOR   Bilirubin Urine SMALL (*)    Ketones, ur 15 (*)    All other components within normal limits  DIFFERENTIAL  LIPASE, BLOOD   No results found.   1. Failure to thrive   2. Gastroenteritis       MDM  51yo F w/ h/o MS presents w/ c/o nausea and diarrhea.  On exam, afebrile, VS w/in nml range, well hydrated, disoriented to event, abd benign and non-tender.  Labs unremarkable.  Likely has viral gastroenteritis.  Patient's sister is concerned because patient's husband/primare care giver died one year ago, pt has been living on her own, and is not caring for herself properly.  For example, she does not cook for herself and has lost 30-40lbs over the past year.  Per nursing staff, patient was so weak in her legs while ambulating to restroom, that they needed to get her a wheelchair.  Pt does not want to be placed but after some convincing, is agreeable to admission for further evaluation.  Consulted [facility] for admission for failure to thrive.  PT will be evaluated by PT/OT and need for placement in ALF/SNF will be determined.       Otilio Miu, [place] 04/16/12 [phone]

## 2012-04-16 NOTE — Progress Notes (Addendum)
  Stephanie Zhang is a 51 y.o. female patient admitted from ED awake, alert - oriented  X 3 - no acute distress noted.  VSS - Blood pressure 147/90, pulse 79, temperature 99.2 F (37.3 C), temperature source Oral, resp. rate 20, weight 40.733 kg (89 lb 12.8 oz), last menstrual period 08/09/2008, SpO2 96.00%.  no c/o shortness of breath, no c/o chest pain. Pt has MS, but able to move all extremities and is about to walk with assistance.Orientation to room, and floor completed with information packet given to patient/family.  Patient declined safety video at this time.  Admission INP armband ID verified with patient/family, and in place.  SR up x 2, fall assessment complete - pt high fall risk, with patient and family able to verbalize understanding of risk associated with falls, and verbalized understanding to call nsg before up out of bed. Bed alarm on.  Call light within reach, patient able to voice, and demonstrate understanding.  Skin, clean-dry- intact without of breakdown or  skin tears.  Bruising noted in lower extremities. Will continue to monitor.     Will cont to eval and treat per MD orders.  Driggers, [place], RN 04/16/2012 6:21 PM

## 2012-04-17 ENCOUNTER — Inpatient Hospital Stay (HOSPITAL_COMMUNITY): Payer: No Typology Code available for payment source

## 2012-04-17 ENCOUNTER — Encounter (HOSPITAL_COMMUNITY): Payer: Self-pay | Admitting: Radiology

## 2012-04-17 DIAGNOSIS — E440 Moderate protein-calorie malnutrition: Secondary | ICD-10-CM

## 2012-04-17 DIAGNOSIS — G35 Multiple sclerosis: Secondary | ICD-10-CM

## 2012-04-17 DIAGNOSIS — R197 Diarrhea, unspecified: Secondary | ICD-10-CM

## 2012-04-17 DIAGNOSIS — R627 Adult failure to thrive: Secondary | ICD-10-CM

## 2012-04-17 LAB — COMPREHENSIVE METABOLIC PANEL
Albumin: 2.9 g/dL — ABNORMAL LOW (ref 3.5–5.2)
BUN: 13 mg/dL (ref 6–23)
Calcium: 8.7 mg/dL (ref 8.4–10.5)
Chloride: 106 mEq/L (ref 96–112)
Creatinine, Ser: 0.64 mg/dL (ref 0.50–1.10)
GFR calc non Af Amer: >90 mL/min (ref >90)
Total Bilirubin: 0.4 mg/dL (ref 0.3–1.2)

## 2012-04-17 LAB — CBC
HCT: 33.9 % — ABNORMAL LOW (ref 36.0–46.0)
MCH: 30.1 pg (ref 26.0–34.0)
MCV: 89.4 fL (ref 78.0–100.0)
RDW: 13.4 % (ref 11.5–15.5)
WBC: 4.8 10*3/uL (ref 4.0–10.5)

## 2012-04-17 MED ORDER — IOHEXOL 300 MG/ML  SOLN
100.0000 mL | Freq: Once | INTRAMUSCULAR | Status: AC | PRN
  Administered 2012-04-17: 100 mL via INTRAVENOUS

## 2012-04-17 MED ORDER — ENSURE COMPLETE PO LIQD
237.0000 mL | Freq: Two times a day (BID) | ORAL | Status: DC
  Administered 2012-04-17 – 2012-04-18 (×3): 237 mL via ORAL

## 2012-04-17 MED ORDER — ADULT MULTIVITAMIN W/MINERALS CH
1.0000 | ORAL_TABLET | Freq: Every day | ORAL | Status: DC
  Administered 2012-04-17 – 2012-04-18 (×2): 1 via ORAL
  Filled 2012-04-17 (×3): qty 1

## 2012-04-17 MED ORDER — METOCLOPRAMIDE HCL 5 MG/ML IJ SOLN
5.0000 mg | Freq: Three times a day (TID) | INTRAMUSCULAR | Status: DC
  Administered 2012-04-17 – 2012-04-18 (×4): 5 mg via INTRAVENOUS
  Filled 2012-04-17 (×8): qty 1

## 2012-04-17 MED ORDER — POTASSIUM CHLORIDE CRYS ER 20 MEQ PO TBCR
20.0000 meq | EXTENDED_RELEASE_TABLET | Freq: Two times a day (BID) | ORAL | Status: AC
  Administered 2012-04-17 (×2): 20 meq via ORAL
  Filled 2012-04-17 (×2): qty 1

## 2012-04-17 NOTE — Progress Notes (Signed)
PATIENT DETAILS Name: Stephanie Zhang Age: 51 y.o. Sex: female Date of Birth: 1961-04-28 Admit Date: 04/16/2012 PCP: Stephanie Boga, MD, MD    Interim History: Patient with long term history of M/S, now with FTT and 40 lb weight loss since the death of her husband one year ago.  Family concerned that her short term memory has become poor and she is unable to care for herself.  They would like placement.  Patient does not want placement.  Psychiatry has been called to determine capacity.    CT scan abdomen/pelvis shows stomach and duodenum full of food and fluid.  Possible GOO vs. Gastroparesis.  Subjective: Patient not a good historian. Reports that she falls in the bathtub occasionally.  Reports more constipation than diarrhea.  No complaints of pain.  Sad that she believes her family has given her dog away.  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  Objective: Weight change:   Intake/Output Summary (Last 24 hours) at 04/17/12 1130 Last data filed at 04/17/12 1100  Gross per 24 hour  Intake 1388.33 ml  Output      0 ml  Net 1388.33 ml   Blood pressure 118/83, pulse 88, temperature 98.2 F (36.8 C), temperature source Oral, resp. rate 20, height 5\' 4"  (1.626 m), weight 40.733 kg (89 lb 12.8 oz), last menstrual period 08/09/2008, SpO2 98.00%. Filed Vitals:   04/16/12 0908 04/16/12 1730 04/16/12 2137 04/17/12 0432  BP: 116/87 147/90 122/90 118/83  Pulse: 73 79 78 88  Temp: 98.4 F (36.9 C) 99.2 F (37.3 C) 98.7 F (37.1 C) 98.2 F (36.8 C)  TempSrc: Oral Oral Oral Oral  Resp: 16 20 20 20   Height:  5\' 4"  (1.626 m)    Weight:  40.733 kg (89 lb 12.8 oz)    SpO2: 100% 96% 96% 98%    Physical Exam: General: No acute distress, appears comfortable., A&O, Conversant. Lungs: Clear to auscultation bilaterally without wheezes or crackles Cardiovascular: Regular rate and rhythm without murmur gallop or rub normal S1 and S2 Abdomen: Nontender, moderately distended,  soft, bowel sounds positive, no rebound, no ascites, no appreciable mass Extremities: No significant cyanosis, clubbing, or edema bilateral lower extremities  Basic Metabolic Panel:  Lab 04/17/12 1610 04/16/12 1805 04/16/12 1025  NA 140 -- 138  K 3.4* -- 3.9  CL 106 -- 103  CO2 23 -- 24  GLUCOSE 110* -- 83  BUN 13 -- 15  CREATININE 0.64 0.67 --  CALCIUM 8.7 -- 9.2  MG -- 1.8 --  PHOS -- 2.8 --   Liver Function Tests:  Lab 04/17/12 0648 04/16/12 1025  AST 18 24  ALT 12 12  ALKPHOS 93 98  BILITOT 0.4 1.1  PROT 5.7* 6.7  ALBUMIN 2.9* 3.4*    Lab 04/16/12 1025  LIPASE 29  AMYLASE --   CBC:  Lab 04/17/12 0648 04/16/12 1805 04/16/12 1025  WBC 4.8 3.2* --  NEUTROABS -- -- 2.8  HGB 11.4* 12.5 --  HCT 33.9* 36.9 --  MCV 89.4 88.7 --  PLT 127* 132* --  Thyroid Function Tests:  Lab 04/16/12 1805  TSH 0.594  T4TOTAL --  FREET4 --  T3FREE --  THYROIDAB --    Studies/Results:  CT Abd/Pelvis:  Stomach is prominently distended with food and fluid. A component of gastroparesis or gastric outlet obstruction would be a consideration. No small bowel dilatation with the colon is diffusely filled with air and fluid down to the level of the rectum.  These imaging features are compatible with diarrhea. Probable areas of peristalsis noted in the sigmoid colon.  Scheduled Meds:    . amantadine  100 mg Oral BID  . heparin  5,000 Units Subcutaneous Q8H  . interferon beta-1a  44 mcg Subcutaneous 3 times weekly  . iohexol  20 mL Oral Q1 Hr x 2  . metoCLOPramide (REGLAN) injection  5 mg Intravenous TID AC & HS  . oxybutynin  5 mg Oral Daily  . potassium chloride  20 mEq Oral BID  . DISCONTD: sodium chloride   Intravenous STAT  . DISCONTD: interferon beta-1a  44 mcg Subcutaneous 3 times weekly   Continuous Infusions:    . sodium chloride 100 mL/hr at 04/16/12 2107   PRN Meds:.acetaminophen, acetaminophen, clonazePAM, iohexol, morphine injection, ondansetron (ZOFRAN) IV,  ondansetron (ZOFRAN) IV, ondansetron, oxyCODONE  Anti-infectives:  Anti-infectives    None      Assessment/Plan: Principal Problem:  *FTT (failure to thrive) in adult Active Problems:  Diarrhea  Weight loss, unintentional  Malnutrition   1.  Gastric Outlet Obstruction vs Gastroparesis.  Will order a follow up xray to determine if any of the oral contrast has migrated into the small bowel or colon.   Pending those results may make the patient NPO and d/c reglan at midnight for gastric emptying study in the morning.  If xray shows no contrast in the lower intestines, and reglan is in-effective, will consider surgical consultation.  2.  Failure to Thrive.  Psych SW feels the patient has competency, but not capacity.  She has definitive short term memory loss, and very poor judgement.  She does not appear to understand where she is physically with regard to her M/S.  Psych recommends the patient not live alone.  Her family is dealing with other health issues and is unable to care for her 24/7 at home. Final Psychiatric evaluation is still pending.  3.  M/S.  Physical therapy has worked with the patient and feels she is a high fall risk.  She needs 24 hour assistance.  PT recommends considering short term SNF for intensive rehab.  4.  Other co-morbidities including:  Breast Cancer, Sarcoid, Chronic Back Pain, Depression appear quiet and stable during this admission.   DVT Prophylaxis:  heparin   LOS: 1 day   Stephanie Zhang 04/17/2012, 11:30 AM [phone]  Attending -I've seen and examined the patient, I agree with the assessment and plan as outlined above. Please note although that the CT scan of the abdomen shows a dilated stomach, it seems that her vomiting has resolved. We will continue to make sure we get the above-noted radiologic studies and will closely follow her clinical course. Psych consult has been obtained to assess capacity, however the patient is agreeable for SNF/DL a  placement.  Dr Windell Norfolk

## 2012-04-17 NOTE — Progress Notes (Signed)
Clinical Social Worker reviewed pt with psychiatry Clinical Social Worker and psychiatry determined that pt lacks the capacity to care for self effectively. Clinical Child psychotherapist met with pt, pt brother, and pt two sisters at bedside to discuss option for disposition planning. Clinical Child psychotherapist discussed with pt short term rehabilitation and assisted living facility with home health physical therapy and occupational therapy at facility. Clinical Social Worker clarified patient and pt family questions about both settings. Pt stated that she is agreeable to ALF, but does not want to explore SNF option at this time. Per pt brother, in March of this year patient and patient family had visited [facility] and patient has had an assessment of needs and met criteria by facility per brother report. However, when patient planned to admit to ALF, pt refused at that time. Pt is agreeable to referral being made to [facility]F and facility re-assessing patient needs if needed. Clinical Social Worker provided supportive counseling to patient as she discussed that the process of placement was a lot of information to process at one time. Clinical Social Worker contacted facility and faxed pt clinical information to [facility]. Clinical Social Worker to await response from [facility] in regard to if they are able to offer pt placement. Clinical Social Worker to keep pt and pt family updated on progress of ALF placement. Clinical Social Worker to continue to follow and facilitate pt discharge needs when pt medically ready for discharge.  Jacklynn Lewis, MSW, LCSWA  Clinical Social Work [phone]

## 2012-04-17 NOTE — Evaluation (Signed)
Occupational Therapy Evaluation Patient Details Name: Stephanie Zhang MRN: [id number] DOB: 1961-03-13 Today's Date: 04/17/2012 Time: 4098-1191 OT Time Calculation (min): 34 min  OT Assessment / Plan / Recommendation Clinical Impression  This 51 yo female admitted with vomiting/diarrhea for several days, failure to thrive, and h/o MS presents to acute OT with problems below thus affecting pt's ability to care for herself. WIll benefit from acute OTwith follow up HHOT at ALF (SNF would be better for more intense rehab however pt not agreeable).    OT Assessment  Patient needs continued OT Services    Follow Up Recommendations  Home health OT (At ALF, SNF would be better)    Barriers to Discharge Decreased caregiver support    Equipment Recommendations  Defer to next venue    Recommendations for Other Services    Frequency  Min 2X/week    Precautions / Restrictions Precautions Precautions: Fall Restrictions Weight Bearing Restrictions: No       ADL  Eating/Feeding: Simulated;Set up Where Assessed - Eating/Feeding: Bed level Grooming: Simulated;Set up Where Assessed - Grooming: Unsupported sitting Upper Body Bathing: Simulated;Set up Where Assessed - Upper Body Bathing: Unsupported;Sitting, bed Lower Body Bathing: Simulated;Minimal assistance Where Assessed - Lower Body Bathing: Unsupported;Sit to stand from bed Upper Body Dressing: Simulated;Minimal assistance Where Assessed - Upper Body Dressing: Unsupported;Sitting, bed Lower Body Dressing: Performed;Minimal assistance Where Assessed - Lower Body Dressing: Supported;Sit to stand from bed Toilet Transfer: Performed;Minimal assistance Toilet Transfer Method: Stand pivot Toilet Transfer Equipment: Bedside commode Toileting - Clothing Manipulation: Simulated;Minimal assistance (also with Min A sitting balance) Where Assessed - Toileting Clothing Manipulation: Standing Toileting - Hygiene: Simulated;Supervision/safety Where  Assessed - Toileting Hygiene: Sit on 3-in-1 or toilet Tub/Shower Transfer: Not assessed Tub/Shower Transfer Method: Not assessed ADL Comments: chorea like movements    OT Diagnosis: Generalized weakness;Cognitive deficits;Disturbance of vision;Apraxia  OT Problem List: Decreased strength;Impaired balance (sitting and/or standing);Impaired vision/perception;Impaired tone;Impaired UE functional use;Decreased cognition OT Treatment Interventions: Self-care/ADL training;DME and/or AE instruction;Therapeutic activities;Patient/family education;Balance training   OT Goals Acute Rehab OT Goals OT Goal Formulation: With patient Time For Goal Achievement: 04/24/12 Potential to Achieve Goals: Good ADL Goals Pt Will Perform Grooming: with set-up;with supervision;Supported;Standing at sink (2 tasks) ADL Goal: Grooming - Progress: Goal set today Pt Will Transfer to Toilet: with min assist;Ambulation;with DME;Comfort height toilet;3-in-1;Grab bars;Regular height toilet ADL Goal: Toilet Transfer - Progress: Goal set today Pt Will Perform Toileting - Clothing Manipulation: Independently (with Min guard A sit to stand and standing) ADL Goal: Toileting - Clothing Manipulation - Progress: Goal set today Pt Will Perform Toileting - Hygiene: Independently;Sitting on 3-in-1 or toilet ADL Goal: Toileting - Hygiene - Progress: Goal set today  Visit Information  Last OT Received On: 04/17/12 Assistance Needed: +1    Subjective Data  Subjective: I have a hospital bed at home----sister does not think so Patient Stated Goal: Really would like to go home, but is agreeable to ALF   Prior Functioning  Home Living Lives With: Alone Available Help at Discharge: Family Type of Home: House Home Access: Ramped entrance;Stairs to enter Entrance Stairs-Number of Steps: 3 Entrance Stairs-Rails: Right Home Layout: One level Bathroom Shower/Tub: Engineer, manufacturing systems: Standard Home Adaptive Equipment:  Grab bars around toilet;Grab bars in shower;Walker - rolling;Wheelchair - powered;Wheelchair - manual;Bedside commode/3-in-1 Additional Comments: Sits down in the bath tub Prior Function Level of Independence: Independent with assistive device(s) Able to Take Stairs?: Yes Driving: No Communication Communication:  (slow to respond) Dominant Hand: Right  Cognition  Overall Cognitive Status: Impaired Area of Impairment: Memory;Safety/judgement;Problem solving;Executive functioning Arousal/Alertness: Awake/alert Behavior During Session: WFL for tasks performed Memory Deficits: Some of the info she told me about her house, the sister in the room said it was not correct    Extremity/Trunk Assessment Right Upper Extremity Assessment RUE ROM/Strength/Tone: Within functional levels RUE Coordination: Deficits RUE Coordination Deficits: choreic movements Left Upper Extremity Assessment LUE ROM/Strength/Tone: Within functional levels LUE Coordination: Deficits LUE Coordination Deficits: choreic movements   Mobility Bed Mobility Bed Mobility: Supine to Sit;Sitting - Scoot to Edge of Bed Supine to Sit: 5: Supervision;HOB flat Sitting - Scoot to Edge of Bed: 5: Supervision Transfers Transfers: Sit to Stand;Stand to Sit Sit to Stand: 4: Min assist;With upper extremity assist;From bed Stand to Sit: With upper extremity assist;With armrests;To chair/3-in-1   Exercise    Balance    End of Session OT - End of Session Activity Tolerance: Patient tolerated treatment well Patient left:  (In W/C on her way to a test)   Evette Georges [phone] 04/17/2012, 5:08 PM

## 2012-04-17 NOTE — Consult Note (Signed)
Clinical Social Work Department CLINICAL SOCIAL WORK PSYCHIATRY SERVICE LINE ASSESSMENT 04/17/2012  Patient:  Stephanie Zhang  Account:  [id number]  Admit Date:  04/16/2012  Clinical Social Worker:  Ashley Jacobs, LCSW  Date/Time:  04/17/2012 11:28 AM Referred by:  Physician  Date referred:  04/17/2012 Reason for Referral  Competency/Guardianship   Presenting Symptoms/Problems (In the person's/family's own words):   Patient presents with failure to thrive, poor judgement, and family wanting her placed in Assisted Living.    CONSULT FOR CAPACITY   Abuse/Neglect/Trauma History (check all that apply)  Denies history   Abuse/Neglect/Trauma Comments:   NA   Psychiatric History (check all that apply)  Denies history   Psychiatric medications:  Patient take Klonopin for Axixiety    Sister reports she has been asked to take medication for depression, however has refused   Current Mental Health Hospitalizations/Previous Mental Health History:   none   Current provider:   none   Place and Date:   none   Current Medications:   Previous Impatient Admission/Date/Reason:   none   Emotional Health / Current Symptoms    Suicide/Self Harm  None reported   Suicide attempt in the past:   none reported   Other harmful behavior:   Failure to take care of self, ?self negelct vs unable to care for self due to current medical problems   Psychotic/Dissociative Symptoms  Confusion  Inability to care for self   Other Psychotic/Dissociative Symptoms:   Short term memory per report of other providers and noticed and assessed during interview.    Patient has lost 40 pounds and not adequately and safely taking care of self per family also due to her medical history    Attention/Behavioral Symptoms  Within Normal Limits   Other Attention / Behavioral Symptoms:   Patient is very calm lying in bed, has poor eye contact and appears depressed and flat.  She is not anxious or restless, she  answers questions, however waxes and wanes with her cognition and memory.  Patient is tearful because she does not want to lose her independence and feels family is trying to take that away by placing her in a ALF    Cognitive Impairment  Impairment due to current medical condition/treatment (stroke,reaction to medication,reaction to infections,etc)  Poor Judgement  Poor/Impaired Decision-Making  Recent Memory Impairment   Other Cognitive Impairment:   Patient however is alert and oriented to self, place, and sitiuation x 2.    She forgets things easily such as not eating, taking medicaiton, cleaning her house so it is safe and healthy to live in.  She relies on family for preparing meals, transportation, and going to doctors appointments.    Mood and Adjustment  Guarded  DEPRESSION  Lethargic    Stress, Anxiety, Trauma, Any Recent Loss/Stressor  Grief/Loss (recent or history)   Anxiety (frequency):   Per patient she is anxious and has a history of anxiety, but upon assessment she is calm and cooperative.   Phobia (specify):   NA   Compulsive behavior (specify):   Na   Obsessive behavior (specify):   Na   Other:   Patient reports it has been hard living alone without her husband.  He passed a year ago.  Patient reports she is fearful that she will lose her independence if she goes to an ALF.   Substance Abuse/Use  None   SBIRT completed (please refer for detailed history):  N  Self-reported substance use:   NA  Urinary Drug Screen Completed:  N Alcohol level:   NA    Environmental/Housing/Living Arrangement  Stable housing   Who is in the home:   Patient lives alone and has four animals.   Emergency contact:  Patient has 2 sisters and a brother who are all very involved and supportive   Personnel officer   Patient's Strengths and Goals (patient's own words):   Clinical Social Worker's Interpretive Summary:   Patient seen for capacity and  it appears patient is alert and oriented to self, situation, and event of admission, however patient is in denial about her physical health and ability to successfully and safely take care of herself.    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  Her remote history is strong, however her recent history such has names, events, taking her medication, appointments, remembering to eat have all digressed to the point where she has major difficulty taking care of herself.    Patient reports she wants to be at home and live with her animals but her family is all in disagreement with this outcome because of her safety and their own personal health and not being able to provide 24 hour care for her.  She uses a wheelchair as well as a walker to get around and relies solely on her family to help meet her needs and daily living.    Patient and sister and CSW all discussed the option of ALF and the safety and comfort of this short term situation. patient was hesitant but agreeable to seek out if [facility] still had a bed available in which her family has toured and placed a deposit).  Family reports they only want what is best for her and her physical health (HX of MS) has taken a tole on her and she continues to not eat, get out of bed, and potential failure to thrive will be her outcome if she is not placed.    Unit CSW to follow up with patient and family this afternoon with regards to dc dispo.   Disposition: Patient is alert and oriented to self, place and situation however in this writer's opinion is in denial about her current physical health and ability to take care of herself, therefore lacks the capacity to care for self effectively.  Patient family has all been present and given input to help justify this and also express safety concerns if she were to return home alone in the condition she is  in.  Anticipated DC plan: ALF: family has been in contact with [facility], in which she has had an assessment of needs and met criteria per sister report.  Brother and other sister to arrive today and participate in discussion and facilitate patient needs at dc.  Ashley Jacobs, MSW LCSW [phone]

## 2012-04-17 NOTE — Evaluation (Signed)
Physical Therapy Evaluation Patient Details Name: Stephanie Zhang MRN: [id number] DOB: [birth year] Today's Date: 04/17/2012 Time: 4098-1191 PT Time Calculation (min): 32 min  PT Assessment / Plan / Recommendation Clinical Impression  Stephanie Zhang is 51 y/o female with 25 year history of MS who presents to ED with FTT. Demonstrates chronic mobility deficits as a result of her MS but is relatively functional however showing some safety awarenss/judgement deficits during our eval today making her at high risk for falls. I find it highly unlikely she doesn't fall a lot at home. Pt will need 24 hour supervision for all out of bed mobility. After speaking with the social worker it seems the plan is for Stephanie Zhang to eventually live in an ALF however I would recommend ST-SNF rehab program to maximize her independence prior to moving to ALF. Will continue to follow in the acute setting to address the below problem list so as to maximize her safety and independence as to decrease her burden of care at next venue.     PT Assessment  Patient needs continued PT services    Follow Up Recommendations  Skilled nursing facility (ST-SNF prior to ALF)    Equipment Recommendations  Defer to next venue    Frequency Min 3X/week    Precautions / Restrictions Precautions Precautions: Fall Restrictions Weight Bearing Restrictions: No         Mobility  Bed Mobility Bed Mobility: Supine to Sit;Sit to Supine Supine to Sit: 5: Supervision;With rails;HOB elevated (30 degrees) Sit to Supine: 5: Supervision;HOB elevated (30 degrees) Details for Bed Mobility Assistance: needs increased time, v/c's for efficiency of movement and safe technique Transfers Transfers: Sit to Stand;Stand to Sit;Stand Pivot Transfers Sit to Stand: 4: Min assist;From chair/3-in-1;With upper extremity assist;With armrests Stand to Sit: 4: Min assist;To bed;With upper extremity assist Stand Pivot Transfers: 3: Mod assist;With armrests  (bed->3in1 with no AD) Details for Transfer Assistance: sit<->stand pt needing specific cues for safe technique specifically not pulling on the RW (although she uses a RW at home all the time) and minA to stabilize once standing; pt initiated SPTbed->3in1 impulsively not asking for any assistance and was very shaky, fallng posteriorly, reaching out for any environmental support she could find, she needed modA facilitation to obtain and maintain balance and to problem solve the transfer (it was almost like she forgot she was trying to get to the commode)  Ambulation/Gait Ambulation/Gait Assistance: 4: Min assist Assistive device: Rolling walker Ambulation/Gait Assistance Details: minA for RW negotiation (to her credit this RW is much different that the rollator that she uses at home); slow and rigid gait pattern almost ataxic in nature, verbal cues throughout for safe technique Gait Pattern: Decreased stride length;Decreased hip/knee flexion - right;Decreased hip/knee flexion - left;Narrow base of support;Ataxic    Exercises     PT Goals Acute Rehab PT Goals PT Goal Formulation: With patient Time For Goal Achievement: 05/01/12 Potential to Achieve Goals: Good Pt will go Supine/Side to Sit: Independently;with HOB 0 degrees PT Goal: Supine/Side to Sit - Progress: Goal set today Pt will go Sit to Supine/Side: with HOB 0 degrees;Independently PT Goal: Sit to Supine/Side - Progress: Goal set today Pt will go Sit to Stand: with modified independence PT Goal: Sit to Stand - Progress: Goal set today Pt will go Stand to Sit: with modified independence PT Goal: Stand to Sit - Progress: Goal set today Pt will Transfer Bed to Chair/Chair to Bed: with modified independence PT Transfer Goal: Bed to  Chair/Chair to Bed - Progress: Goal set today Pt will Stand: with modified independence;with unilateral upper extremity support;3 - 5 min PT Goal: Stand - Progress: Goal set today Pt will Ambulate: 51 - 150  feet;with modified independence;with least restrictive assistive device PT Goal: Ambulate - Progress: Goal set today Pt will Perform Home Exercise Program: Independently PT Goal: Perform Home Exercise Program - Progress: Goal set today  Visit Information  Last PT Received On: 04/17/12 Assistance Needed: +1    Subjective Data  Subjective: I really can't remember the conversation that brought me in here. I think i got in an argument with my sister.    Prior Functioning  Home Living Lives With: Alone Available Help at Discharge: Family (sisters involved) Type of Home: House Home Access: Ramped entrance;Stairs to enter Entrance Stairs-Number of Steps: pt goes up 3 steps (difficulty getting her walker up the steps) Entrance Stairs-Rails: Right Home Layout: One level Bathroom Shower/Tub: Engineer, manufacturing systems: Standard Home Adaptive Equipment: Grab bars around toilet;Grab bars in shower;Walker - rolling;Wheelchair - powered;Wheelchair - manual;Bedside commode/3-in-1 Additional Comments: pt reports she sits down in the bathtub Prior Function Level of Independence: Independent with assistive device(s) Able to Take Stairs?: Yes Driving: No Vocation: Part time employment Comments: a bit difficult to determine because she has some memory issues; sister takes her to work Musician: No difficulties Dominant Hand: Right    Cognition  Overall Cognitive Status: Impaired Area of Impairment: Memory;Safety/judgement;Problem solving;Awareness of errors Arousal/Alertness: Awake/alert Orientation Level: Disoriented to;Situation (did not assess time but knew the day she was admitted) Behavior During Session: [facility] for tasks performed Memory Deficits: did not recall why she was brought to the hospital and had a hard time staying on track during our conversation and remembering any of the details about her house Safety/Judgement: Decreased safety judgement for tasks  assessed;Impulsive;Decreased awareness of need for assistance Awareness of Errors: Assistance required to identify errors made;Assistance required to correct errors made Awareness of Errors - Other Comments: impulsively initiated transfering to the 3in1with poor balance and reaching for environmental supports, unable to problem solve how to perform this task and not asking for help  Cognition - Other Comments: seems aware that she likely needs more assist and that it would be safer to live in an assisted living facility however tells me that she just doesn't want to lose her independence    Extremity/Trunk Assessment Right Upper Extremity Assessment RUE ROM/Strength/Tone: Deficits RUE ROM/Strength/Tone Deficits: noted decreased muscle mass throughout bilateral upper extremities and generalized weakness but able to functionally move herself throughout the bed RUE Sensation: WFL - Light Touch;WFL - Proprioception RUE Coordination: Deficits RUE Coordination Deficits: some tremulousness noted with reaching and difficulty grasping her cup or othe objects on her tray Left Upper Extremity Assessment LUE ROM/Strength/Tone: Deficits LUE ROM/Strength/Tone Deficits: see details of RUE LUE Sensation: WFL - Light Touch;WFL - Proprioception LUE Coordination: Deficits LUE Coordination Deficits: see details of RUE Right Lower Extremity Assessment RLE ROM/Strength/Tone: Deficits RLE ROM/Strength/Tone Deficits: grossly diminished muscle mass throughout with instability when standing unsupported over weak legs requring minA; grossly 3/5 throughout RLE Sensation: WFL - Light Touch;WFL - Proprioception RLE Coordination: Deficits RLE Coordination Deficits: rigidity noted with gait, difficulty with initiation of step Left Lower Extremity Assessment LLE ROM/Strength/Tone: Deficits LLE ROM/Strength/Tone Deficits: see RLE LLE Sensation: WFL - Light Touch;WFL - Proprioception LLE Coordination: Deficits LLE  Coordination Deficits: see RLE   Balance Balance Balance Assessed: No  End of Session PT - End of  Session Equipment Utilized During Treatment: Gait belt Activity Tolerance: Patient tolerated treatment well Patient left: in bed;with call bell/phone within reach;with bed alarm set Nurse Communication: Mobility status (nsg tech notified)   [facility] HELEN 04/17/2012, 11:35 AM

## 2012-04-17 NOTE — Progress Notes (Signed)
INITIAL ADULT NUTRITION ASSESSMENT Date: 04/17/2012   Time: 11:57 AM Reason for Assessment: Consult  ASSESSMENT: Female 51 y.o.  Dx: FTT (failure to thrive) in adult  Hx:  Past Medical History  Diagnosis Date  . MS (multiple sclerosis) 1987  . Hypoglycemia   . Breast cancer 10/2001    Stage II (right side)  . Multiple sclerosis   . GERD (gastroesophageal reflux disease)   . Osteoporosis   . Anxiety   . Blood transfusion     "when I was born"  . Headache     "lots"  . Chronic back pain   . Depression   . Confusion   . Short-term memory loss     Related Meds:     . amantadine  100 mg Oral BID  . heparin  5,000 Units Subcutaneous Q8H  . interferon beta-1a  44 mcg Subcutaneous 3 times weekly  . iohexol  20 mL Oral Q1 Hr x 2  . metoCLOPramide (REGLAN) injection  5 mg Intravenous TID AC & HS  . oxybutynin  5 mg Oral Daily  . potassium chloride  20 mEq Oral BID  . DISCONTD: sodium chloride   Intravenous STAT  . DISCONTD: interferon beta-1a  44 mcg Subcutaneous 3 times weekly     Ht: 5\' 4"  (162.6 cm)  Wt: 89 lb 12.8 oz (40.733 kg)  Ideal Wt: 54.5 kg % Ideal Wt: 75%  Usual Wt: ~125 lbs prior to husband passing away Wt Readings from Last 10 Encounters:  04/16/12 89 lb 12.8 oz (40.733 kg)  02/05/12 104 lb (47.174 kg)  10/16/11 106 lb 3.2 oz (48.172 kg)  03/19/11 129 lb 8 oz (58.741 kg)  08/10/11 113 lb (51.256 kg)  07/27/10 121 lb (54.885 kg)  02/16/10 129 lb (58.514 kg)  02/13/10 127 lb (57.607 kg)  05/31/09 123 lb (55.792 kg)  11/25/08 122 lb (55.339 kg)    % Usual Wt: 70%  Body mass index is 15.41 kg/(m^2). pt is underweight  Food/Nutrition Related Hx: pt with weight loss for about 11 months, hx of anorexia nervosa per pt's sister. Eats only one meal daily, "lunchable"  Labs:  CMP     Component Value Date/Time   NA 140 04/17/2012 0648   K 3.4* 04/17/2012 0648   CL 106 04/17/2012 0648   CO2 23 04/17/2012 0648   GLUCOSE 110* 04/17/2012 0648   BUN 13  04/17/2012 0648   CREATININE 0.64 04/17/2012 0648   CALCIUM 8.7 04/17/2012 0648   PROT 5.7* 04/17/2012 0648   ALBUMIN 2.9* 04/17/2012 0648   AST 18 04/17/2012 0648   ALT 12 04/17/2012 0648   ALKPHOS 93 04/17/2012 0648   BILITOT 0.4 04/17/2012 0648   GFRNONAA >90 04/17/2012 0648   GFRAA >90 04/17/2012 0648     Intake/Output Summary (Last 24 hours) at 04/17/12 1201 Last data filed at 04/17/12 1100  Gross per 24 hour  Intake 1388.33 ml  Output      0 ml  Net 1388.33 ml     Diet Order: General  Supplements/Tube Feeding: none  IVF:    sodium chloride Last Rate: 100 mL/hr at 04/16/12 2107    Estimated Nutritional Needs:   Kcal: 1300-1500 Protein: 50-60 gm  Fluid:  > 1.5 L  RD spoke with pt and her sister. Pt seemed unsure about how much she was eating at first, but later said she was only eating one meal daily, lunch when she is at work. She eats a lunchable type meal. Does  go out to eat with her sister at least once a week. Pt got tearful when weight was addressed. RD worked with pt to identify easy breakfast and dinner idea's, but pt was hesitant when RD asked if she thought she could try them. Pt would not commit to taking a multivitamin at home.   This RD is concerned that the pt is self limiting intake. It seems as though the only time she is eating is when other people are around. This problem seems to have started after the death of her husband. Believe that pt would benefit from a psychiatric consult.   Pt also c/o GI problems, diarrhea and constipation per MD notes. This could be related to her poor intake. Pt may have decreased enzyme production from very low calorie intake.   Pt with weight loss of 42 lbs, 33% in about 11 months. Severe weight loss. Based on pt diet recall, eating less then 50% of need for > 1 months. Pt meets criteria for severe malnutrition in the context of social or environmental circumstances 2/2 to weight loss and intake.   Pt is agreeable to taking Ensure BID and a  multivitamin daily.    NUTRITION DIAGNOSIS: -Inadequate oral intake (NI-2.1).  Status: Ongoing  RELATED TO: ? Forgetting meals or self limiting intake  AS EVIDENCE BY: weight loss, severe malnutrition   MONITORING/EVALUATION(Goals): Goal: Pt will eat > 50% of meals and snacks Monitor: PO intake, weight, labs, I/O's  EDUCATION NEEDS: -No education needs identified at this time  INTERVENTION: 1. RD will add Ensure BID between meals 2. RD will add adult multivitamin daily 3. Recommend psych evaluation for ?  Hx of eating disorder 4. RD will continue to follow  Dietitian [phone]  DOCUMENTATION CODES Per approved criteria  -Severe  malnutrition in the context of social or environmental circumstances -Underweight    KOWALSKI, [facility] MARIE 04/17/2012, 11:57 AM

## 2012-04-17 NOTE — Care Management Note (Signed)
    Page 1 of 1   04/18/2012     11:47:29 AM   CARE MANAGEMENT NOTE 04/18/2012  Patient:  Stephanie Zhang, Stephanie Zhang   Account Number:  [id number]  Date Initiated:  04/17/2012  Documentation initiated by:  Letha Cape  Subjective/Objective Assessment:   dx FTT,  MS  admit- lives alone.     Action/Plan:   Psych consult to determine compacity. Patient wants to go to ALF, refuses SNF. waiting for psych to see pt.  pt eval- recs ST- SNF before going to ALF   Anticipated DC Date:  04/18/2012   Anticipated DC Plan:  ASSISTED LIVING / REST HOME  In-house referral  Clinical Social Worker      DC Planning Services  CM consult      Choice offered to / List presented to:             Status of service:  Completed, signed off Medicare Important Message given?   (If response is "NO", the following Medicare IM given date fields will be blank) Date Medicare IM given:   Date Additional Medicare IM given:    Discharge Disposition:  ASSISTED LIVING  Per UR Regulation:  Reviewed for med. necessity/level of care/duration of stay  If discussed at Long Length of Stay Meetings, dates discussed:    Comments:  PCP Eleonore Chiquito  04/18/12 11:45 Letha Cape RN, BSN [phone] Patient for discharge today to [facility] per CSW.  04/17/12 14:23 Letha Cape RN, BSN [phone] patient lives alone, per physical therapy recs st- snf prior to going to ALF.  Patient does not want to go to SNF, but will go to ALF.  CSW referral.  Psych to see to determine compacity.

## 2012-04-18 DIAGNOSIS — G35 Multiple sclerosis: Secondary | ICD-10-CM

## 2012-04-18 DIAGNOSIS — K3184 Gastroparesis: Principal | ICD-10-CM | POA: Diagnosis present

## 2012-04-18 DIAGNOSIS — R627 Adult failure to thrive: Secondary | ICD-10-CM

## 2012-04-18 DIAGNOSIS — F341 Dysthymic disorder: Secondary | ICD-10-CM

## 2012-04-18 DIAGNOSIS — R197 Diarrhea, unspecified: Secondary | ICD-10-CM

## 2012-04-18 DIAGNOSIS — E440 Moderate protein-calorie malnutrition: Secondary | ICD-10-CM

## 2012-04-18 MED ORDER — ENSURE COMPLETE PO LIQD: 237.0000 mL | Freq: Two times a day (BID) | ORAL | Status: DC

## 2012-04-18 MED ORDER — BUPROPION HCL 75 MG PO TABS: 150.0000 mg | ORAL_TABLET | Freq: Every day | ORAL | Status: DC

## 2012-04-18 MED ORDER — BUPROPION HCL 75 MG PO TABS
150.0000 mg | ORAL_TABLET | Freq: Every day | ORAL | Status: DC
  Administered 2012-04-18: 150 mg via ORAL
  Filled 2012-04-18: qty 2

## 2012-04-18 MED ORDER — METOCLOPRAMIDE HCL 10 MG PO TABS: 5.0000 mg | ORAL_TABLET | Freq: Three times a day (TID) | ORAL | Status: DC

## 2012-04-18 MED ORDER — METOCLOPRAMIDE HCL 10 MG PO TABS
10.0000 mg | ORAL_TABLET | Freq: Three times a day (TID) | ORAL | Status: DC
  Administered 2012-04-18: 10 mg via ORAL
  Filled 2012-04-18: qty 1

## 2012-04-18 MED ORDER — METOCLOPRAMIDE HCL 10 MG PO TABS: 10.0000 mg | ORAL_TABLET | Freq: Three times a day (TID) | ORAL | Status: DC

## 2012-04-18 NOTE — Progress Notes (Signed)
.  Clinical social worker assisted with patient discharge to assisted living facility, [facility]. Patient transportation will be provided by patient family. .No further Clinical Social Work needs, signing off. [name]  Catha Gosselin, Theresia Majors  [phone] .04/18/2012 13:44pm

## 2012-04-18 NOTE — Consult Note (Signed)
Following patient for capacity and making her own decisions.  Patient is now in agreement for ALF referral and placement for short term care.  Patient is working with family to be placed at [facility].  Unit CSW has also been involved and psych will sign off at this time.  No other needs.    Ashley Jacobs, MSW LCSW [phone]

## 2012-04-18 NOTE — Clinical Social Work Placement (Signed)
     Clinical Social Work Department CLINICAL SOCIAL WORK PLACEMENT NOTE 04/18/2012  Patient:  Stephanie Zhang, Stephanie Zhang  Account Number:  [id number] Admit date:  04/16/2012  Clinical Social Worker:  Jacelyn Grip  Date/time:  04/17/2012 03:15 PM  Clinical Social Work is seeking post-discharge placement for this patient at the following level of care:   ASSISTED LIVING/REST HOME   (*CSW will update this form in Epic as items are completed)     Patient/family provided with [facility] Department of Clinical Social Works list of facilities offering this level of care within the geographic area requested by the patient (or if unable, by the patients family).  04/17/2012  Patient/family informed of their freedom to choose among providers that offer the needed level of care, that participate in Medicare, Medicaid or managed care program needed by the patient, have an available bed and are willing to accept the patient.    Patient/family informed of [facility] ownership interest in [facility], as well as of the fact that they are under no obligation to receive care at this facility.  PASARR submitted to EDS on 04/17/2012 PASARR number received from EDS on 04/18/2012  FL2 transmitted to all facilities in geographic area requested by pt/family on  04/17/2012 FL2 transmitted to all facilities within larger geographic area on   Patient informed that his/her managed care company has contracts with or will negotiate with  certain facilities, including the following:     Patient/family informed of bed offers received:  04/18/2012 Patient chooses bed at [facility] AT [facility] Physician recommends and patient chooses bed at    Patient to be transferred to [facility] AT [facility] on  04/18/2012 Patient to be transferred to facility by family transportation  The following physician request were entered in Epic:   Additional Comments: Pt declined ALF list and pt and pt family  interested in [facility].

## 2012-04-18 NOTE — Progress Notes (Addendum)
Stephanie Zhang to be D/C'd Skilled nursing facility per MD order.  Discussed with the patient and all questions fully answered.   Dillon, [place]  Home Medication Instructions [id number]   Printed on:04/18/12 1102  Medication Information                    ibuprofen (ADVIL,MOTRIN) 800 MG tablet Take 800 mg by mouth every 8 (eight) hours as needed. As needed for pain.           Calcium-Vitamin D (CALTRATE 600 PLUS-VIT D PO) Take 1 tablet by mouth daily.            oxybutynin (DITROPAN) 5 MG tablet Take 5 mg by mouth daily.             amantadine (SYMMETREL) 100 MG capsule Take 100 mg by mouth 2 (two) times daily.             interferon beta-1a (REBIF) 44 MCG/0.5ML injection Inject 44 mcg into the skin 3 (three) times a week.            pantoprazole (PROTONIX) 40 MG tablet TAKE 1 TABLET DAILY           clonazePAM (KLONOPIN) 0.5 MG tablet Take 1 tablet (0.5 mg total) by mouth 2 (two) times daily as needed. As needed for anxiety.           buPROPion (WELLBUTRIN) 75 MG tablet Take 2 tablets (150 mg total) by mouth daily.           feeding supplement (ENSURE COMPLETE) LIQD Take 237 mLs by mouth 2 (two) times daily between meals.           metoCLOPramide (REGLAN) 10 MG tablet Take 0.5 tablets (5 mg total) by mouth 4 (four) times daily -  before meals and at bedtime.             VVS, Skin clean, dry and intact without evidence of skin break down, no evidence of skin tears noted. IV catheter discontinued intact. Site without signs and symptoms of complications. Dressing and pressure applied.  An After Visit Summary was printed and given to the patient. Patient escorted via wheelchair, and D/C to [facility] via personal vehicle.  [name] 04/18/2012 11:02 AM

## 2012-04-18 NOTE — Progress Notes (Signed)
Physical Therapy Treatment Patient Details Name: Stephanie Zhang MRN: [id number] DOB: 08/12/1961 Today's Date: 04/18/2012 Time: 1132-1201 PT Time Calculation (min): 29 min  PT Assessment / Plan / Recommendation Comments on Treatment Session  Pt did well. Plan is for her to go to [facility] and get HHPT as she refused skilled nursing.     Follow Up Recommendations  Skilled nursing facility;Supervision/Assistance - 24 hour    Barriers to Discharge        Equipment Recommendations  Defer to next venue    Recommendations for Other Services    Frequency     Plan Discharge plan remains appropriate;Frequency remains appropriate    Precautions / Restrictions Precautions Precautions: Fall       Mobility  Bed Mobility Supine to Sit: 5: Supervision;HOB elevated Sitting - Scoot to Edge of Bed: 5: Supervision Transfers Sit to Stand: 4: Min assist;From bed;From chair/3-in-1;With upper extremity assist;With armrests Stand to Sit: 4: Min assist;To chair/3-in-1;With armrests;With upper extremity assist Stand Pivot Transfers: 4: Min assist;With armrests Details for Transfer Assistance: cues for safe technique and sequencing, specifically hand placement; min-modA for her SPT still very easily confused during SPT needing more hands on Ambulation/Gait Ambulation/Gait Assistance: 4: Min guard Ambulation Distance (Feet): 20 Feet Assistive device: Rolling walker Ambulation/Gait Assistance Details: assist to negotiate RW through tight spaces and verbal cues for safety during turns Gait Pattern: Ataxic;Trunk flexed;Narrow base of support;Decreased hip/knee flexion - right;Decreased hip/knee flexion - left    Exercises      PT Goals Acute Rehab PT Goals PT Goal: Supine/Side to Sit - Progress: Progressing toward goal PT Goal: Sit to Stand - Progress: Progressing toward goal PT Goal: Stand to Sit - Progress: Progressing toward goal PT Transfer Goal: Bed to Chair/Chair to Bed - Progress:  Progressing toward goal PT Goal: Stand - Progress: Progressing toward goal PT Goal: Ambulate - Progress: Progressing toward goal  Visit Information  Last PT Received On: 04/18/12 Assistance Needed: +1    Subjective Data  Subjective: I think I need to go to the bathroom.    Cognition  Overall Cognitive Status: Impaired Area of Impairment: Memory;Following commands;Safety/judgement;Awareness of errors;Awareness of deficits;Problem solving    Balance  Dynamic Standing Balance Dynamic Standing - Balance Support: Left upper extremity supported Dynamic Standing - Comments: at least one hand on sink during all ADL activity today but pt showing minimal weight shifting slightly outside of her BOS, reaching and upper extremity activity needing mingaurdA  End of Session PT - End of Session Equipment Utilized During Treatment: Gait belt Activity Tolerance: Patient tolerated treatment well Patient left: in chair;with call bell/phone within reach;with family/visitor present;with chair alarm set    [facility] HELEN 04/18/2012, 1:13 PM

## 2012-04-18 NOTE — Discharge Instructions (Signed)
Gastroparesis diet.

## 2012-04-18 NOTE — BH Assessment (Signed)
Patient Identification:  Stephanie Zhang Date of Evaluation:  04/18/2012  Reason for Consult:Evaluate Capacity  Referring Physician: Dr. Jerral Ralph  History of Present Illness:This is a 51 year old female, with known history of multiple sclerosis, diagnosed at age 67 years, under care of Dr Conrad [place] [facility], right breat cancer 10/2001, s/p partial mastectomy, chemotherapy and radiation therapy, under care of Dr Truett Perna and Dr Kathrynn Running (Delared disease-free in 11/2011), sarcoidosis, diagnosed via mediastinoscopy/biopsy 01/01/2002, GERD, Depression, anxiety, chronic back pain, presenting with above symptoms. Per family, patient had anorexia nervosa, diagnosed before her MS diagnosis. History was obtained from patient's two sisters, who were present in the ED, because of patient's problems with short term memory, which sisters insist has become much worse, over the last 6 months. It appears that patient has had poor appetite, and not been eating well for about a year, and in that time, has lost 30-40 pounds. She was widowed about a year ago, lives alone with her pets, is unkempt, and appears quite unable to take care of herself. Her sisters take her grocery shopping, and ensure that she has a good amount of food at home, but patient insists on doing her own cooking, and they feel that she often forgets to cook or eat. They sometimes take her out to eat, about once a week, and patient gets diarrhea afterwards. This has been going on for several months. Family has made attempts to get her into an assisted living facitliy, but patient is not agreeable.   Past Psychiatric History:   Past Medical History:     Past Medical History  Diagnosis Date  . MS (multiple sclerosis) 1987  . Hypoglycemia   . Breast cancer 10/2001    Stage II (right side)  . Multiple sclerosis   . GERD (gastroesophageal reflux disease)   . Osteoporosis   . Anxiety   . Blood transfusion     "when I was born"  . Headache       "lots"  . Chronic back pain   . Depression   . Confusion   . Short-term memory loss        Past Surgical History  Procedure Date  . Breast lumpectomy 2002    right  . Breast biopsy 2002    right    Allergies:  Allergies  Allergen Reactions  . Sulfa Antibiotics Itching and Rash    "long time since I had breakout"  . Sulfacetamide Sodium Itching and Rash    Current Medications:  Prior to Admission medications   Medication Sig Start Date End Date Taking? Authorizing Provider  amantadine (SYMMETREL) 100 MG capsule Take 100 mg by mouth 2 (two) times daily.     Yes Historical Provider, MD  Calcium-Vitamin D (CALTRATE 600 PLUS-VIT D PO) Take 1 tablet by mouth daily.    Yes Historical Provider, MD  clonazePAM (KLONOPIN) 0.5 MG tablet Take 1 tablet (0.5 mg total) by mouth 2 (two) times daily as needed. As needed for anxiety. 02/05/12  Yes Gordy Savers, MD  ibuprofen (ADVIL,MOTRIN) 800 MG tablet Take 800 mg by mouth every 8 (eight) hours as needed. As needed for pain.   Yes Historical Provider, MD  interferon beta-1a (REBIF) 44 MCG/0.5ML injection Inject 44 mcg into the skin 3 (three) times a week.    Yes Historical Provider, MD  oxybutynin (DITROPAN) 5 MG tablet Take 5 mg by mouth daily.     Yes Historical Provider, MD  pantoprazole (PROTONIX) 40 MG tablet TAKE  1 TABLET DAILY 08/14/11  Yes Gordy Savers, MD  buPROPion [facility]) 75 MG tablet Take 2 tablets (150 mg total) by mouth daily. 04/18/12 04/18/13  [name], PA  feeding supplement (ENSURE COMPLETE) LIQD Take 237 mLs by mouth 2 (two) times daily between meals. 04/18/12   [name], PA  metoCLOPramide (REGLAN) 10 MG tablet Take 0.5 tablets (5 mg total) by mouth 4 (four) times daily -  before meals and at bedtime. 04/18/12 04/28/12  [name], PA    Social History:    reports that she quit smoking about 11 months ago. She has never used smokeless tobacco. She reports that she drinks alcohol. She reports  that she uses illicit drugs (LSD, Marijuana, and "Crack" cocaine).   Family History:    Family History  Problem Relation Age of Onset  . Diabetes Mother   . Cancer Mother     cervical cancer  . Heart disease Mother   . Diabetes Father     Mental Status Examination/Evaluation: Objective:  Appearance: Undernouraished  Psychomotor Activity:  Decreased  Eye Contact::  Good  Speech:  Clear and Coherent and tremulous voice  Volume:  Decreased  Mood:  Dysphoric  Affect:  apprehensive  Thought Process:  Coherent  Orientation:  Full  Thought Content:  Apprehensive about SNF  Suicidal Thoughts:  No  Homicidal Thoughts:  No  Judgement:  Fair  Insight:  Fair    DIAGNOSIS:   AXIS I   Depression due to Chronic Neurodegenerative Disease  AXIS II  Deffered  AXIS III See medical notes.  AXIS IV economic problems, other psychosocial or environmental problems, problems related to social environment and ervices for loss of independent care  AXIS V 41-50 serious symptoms     Assessment/Plan: Discussed with Dr. Jerral Ralph, Discussed with Psych CSW Pt admits she is unable to feed herself well.  She knows there has been a progressive weakness. RECOMMENDATION:   1 Consider Wellbutrin XL 150 mg Daily to augment Dopamine function 2. Pt has capacity to decide on ALF  She was hesitant but sisters have encouraged her to consider it She has agreed. 3. Pt is to be transferred to ALF for PT rehab, as possible, and have regular assistance with feeding. When medically stable.   Sahara Fujimoto J. Ferol Luz, MD Psychiatrist   04/18/2012 2:39 PM

## 2012-04-18 NOTE — Discharge Summary (Signed)
Patient ID: Stephanie Zhang MRN: [id number] DOB/AGE: [birth year] 51 y.o.  Admit date: 04/16/2012 Discharge date: 04/18/2012  Primary Care Physician:  Rogelia Boga, MD, MD  Discharge Diagnoses:   Present on Admission:  .Diarrhea .FTT (failure to thrive) in adult .Weight loss, unintentional .Malnutrition .Gastroparesis   Medication List  As of 04/18/2012 11:02 AM   TAKE these medications         amantadine 100 MG capsule   Commonly known as: SYMMETREL   Take 100 mg by mouth 2 (two) times daily.      buPROPion 75 MG tablet   Commonly known as: WELLBUTRIN   Take 2 tablets (150 mg total) by mouth daily.      CALTRATE 600 PLUS-VIT D PO   Take 1 tablet by mouth daily.      clonazePAM 0.5 MG tablet   Commonly known as: KLONOPIN   Take 1 tablet (0.5 mg total) by mouth 2 (two) times daily as needed. As needed for anxiety.      feeding supplement Liqd   Take 237 mLs by mouth 2 (two) times daily between meals.      ibuprofen 800 MG tablet   Commonly known as: ADVIL,MOTRIN   Take 800 mg by mouth every 8 (eight) hours as needed. As needed for pain.      interferon beta-1a 44 MCG/0.5ML injection   Commonly known as: REBIF   Inject 44 mcg into the skin 3 (three) times a week.      metoCLOPramide 10 MG tablet   Commonly known as: REGLAN   Take 0.5 tablets (5 mg total) by mouth 4 (four) times daily -  before meals and at bedtime.      oxybutynin 5 MG tablet   Commonly known as: DITROPAN   Take 5 mg by mouth daily.      pantoprazole 40 MG tablet   Commonly known as: PROTONIX   TAKE 1 TABLET DAILY            Consults:  1.  Psychiatry  Brief H and P: Patient with long term history of multiple sclerosis, sarcoid and history of breast cancer, now withmultiple falls, worsening short term memory loss,  FTT and 40 lb weight loss since the death of her husband one year ago. Family concerned that her short term memory has become poor and she is unable to care for herself.  They would like placement. Patient did not initially want placement. Psychiatry was called to determine capacity. CT scan abdomen/pelvis done on admission showed a  stomach and duodenum full of food and fluid. Possible GOO vs. Gastroparesis.   1.  Gastric outlet obstruction vs autonomic Gastroparesis.  The patient reported abdominal pain and vomiting at the time of admission.  CT scan (detailed below) showed an upper GI tract that did not appear to be emptying.  Follow up abdominal xray showed the contrast had moved into the colon.  Hence, the patient did not have a gastric outlet obstruction.  The patient was stared on reglan which she appeared to tolerate, and she was educated about eating a low fiber diet.  We request that she follow up with her Primary care Physician (Dr. Kirtland Bouchard).  If the reglan does not improve her symptoms she will likely need an upper endoscopy.  2 Recent Falls.  Likely secondary to progressive worsening of M/S.  The patient is a poor historian, but did admit to falling in the bathtub.  She lives alone with her dogs.  She  was evaluated by physical and occupational therapy who found her to be weak and a high fall risk.  They noted that if she is monitored closely she does well with her walker.  Intensive physical therapy and 24 x 7 assistance was recommended.  The patient will be discharged to an assisted living facility with home health physical and occupational therapy and well as an Charity fundraiser.  3.  Psychiatric.  Initially the patient refused to move to an assisted living facility.  She did not appear to understand that she was unsafe at home alone.  Psychiatry evaluated her and felt that she did have capacity to make decisions.  After some discussion and reasoning, the patient was agreeable to moving into an assisted living facility. After evaluating the patient, psychiatry started patient on Wellbutrin for depression.  4. Other co-morbidities including: Breast Cancer, Sarcoid, Chronic Back  Pain, Depression appear quiet and stable during this admission.    Physical Exam on Discharge: General: Alert, awake, oriented x3, in no acute distress, very pleasant.  Receiving a bath. HEENT: No bruits, no goiter. Heart: Regular rate and rhythm, without murmurs, rubs, gallops. Lungs: Clear to auscultation bilaterally. Abdomen: Soft, nontender, nondistended, positive bowel sounds. Extremities: No clubbing cyanosis or edema with positive pedal pulses. Neuro: Grossly intact, nonfocal.  Filed Vitals:   04/17/12 0432 04/17/12 1314 04/17/12 2158 04/18/12 0502  BP: 118/83 138/93 127/76 138/89  Pulse: 88 90 78 85  Temp: 98.2 F (36.8 C) 98.3 F (36.8 C) 98.3 F (36.8 C) 98 F (36.7 C)  TempSrc: Oral Oral Oral Oral  Resp: 20 20 20 16   Height:      Weight:      SpO2: 98% 98% 98% 100%     Intake/Output Summary (Last 24 hours) at 04/18/12 1102 Last data filed at 04/18/12 0900  Gross per 24 hour  Intake 1835.5 ml  Output    100 ml  Net 1735.5 ml    Basic Metabolic Panel:  Lab 04/17/12 1610 04/16/12 1805 04/16/12 1025  NA 140 -- 138  K 3.4* -- 3.9  CL 106 -- 103  CO2 23 -- 24  GLUCOSE 110* -- 83  BUN 13 -- 15  CREATININE 0.64 0.67 --  CALCIUM 8.7 -- 9.2  MG -- 1.8 --  PHOS -- 2.8 --   Liver Function Tests:  Lab 04/17/12 0648 04/16/12 1025  AST 18 24  ALT 12 12  ALKPHOS 93 98  BILITOT 0.4 1.1  PROT 5.7* 6.7  ALBUMIN 2.9* 3.4*    Lab 04/16/12 1025  LIPASE 29  AMYLASE --   CBC:  Lab 04/17/12 0648 04/16/12 1805 04/16/12 1025  WBC 4.8 3.2* --  NEUTROABS -- -- 2.8  HGB 11.4* 12.5 --  HCT 33.9* 36.9 --  MCV 89.4 88.7 --  PLT 127* 132* --   Thyroid Function Tests:  Lab 04/16/12 1805  TSH 0.594  T4TOTAL --  FREET4 --  T3FREE --  THYROIDAB --     Significant Diagnostic Studies:  Dg Chest 2 View  04/16/2012  *RADIOLOGY REPORT*  Clinical Data: Sarcoid, weight loss  CHEST - 2 VIEW  Comparison: 08/14/2006  Findings: Lungs clear.  Heart size and pulmonary  vascularity normal.  No effusion.  Visualized bones unremarkable.  IMPRESSION: No acute disease  Original Report Authenticated By: Osa Craver, M.D.   Dg Abd 1 View  04/17/2012  *RADIOLOGY REPORT*  Clinical Data: Abdominal discomfort, possible gastric outlet obstruction  ABDOMEN - 1 VIEW  Comparison:  CT abdomen pelvis of 04/16/2012  Findings: The contrast material from the CT has passed into the colon and the colon is nondistended.  No free air is seen.  There is a fluid level and food debris level within the still somewhat distended stomach however.  IMPRESSION: Contrast has passed into the nondistended colon.  There is still fluid and food debris distention of the stomach however.  Original Report Authenticated By: Juline Patch, M.D.   Ct Abdomen Pelvis W Contrast  04/17/2012  *RADIOLOGY REPORT*  Clinical Data: Weight loss and watery stool.  CT ABDOMEN AND PELVIS WITH CONTRAST  Technique:  Multidetector CT imaging of the abdomen and pelvis was performed following the standard protocol during bolus administration of intravenous contrast.  Contrast: OMNIPAQUE IOHEXOL 300 MG/ML  SOLN  Comparison: None.  Findings: No focal abnormalities seen in the liver or spleen.  The stomach is markedly distended with fluid and food.  Duodenum is unremarkable.  Pancreas and adrenal glands have normal imaging features.  Gallstones are present, measuring up to 1.7 cm in diameter.  No focal abnormalities seen in either kidney.  No evidence for hydronephrosis.  No abdominal aortic aneurysm.  No free fluid or lymphadenopathy in the abdomen.  Small bowel loops are not dilated.  Oral contrast material has migrated through to the right colon.  The terminal ileum is normal. The appendix is normal.  The colon is diffusely distended with gas and fluid is seen in the colonic lumen down to the level of the rectum.  This would be compatible with the reported clinical history of diarrhea.  Several small areas of colonic  narrowing of the sigmoid colon region are felt to be most likely related to peristalsis.  Bladder is nondistended.  The uterus is unremarkable although it is flexed to the left.  There is no adnexal mass.  Bone windows reveal no worrisome lytic or sclerotic osseous lesions.  IMPRESSION: Stomach is prominently distended with food and fluid.  A component of gastroparesis or gastric outlet obstruction would be a consideration.  No small bowel dilatation with the colon is diffusely filled with air and fluid down to the level of the rectum.  These imaging features are compatible with diarrhea.  Probable areas of peristalsis noted in the sigmoid colon.  Cholelithiasis.  Original Report Authenticated By: ERIC A. MANSELL, M.D.   Disposition and Follow-up:  Patient is stable for discharge to Assisted Living Facility.  She will need intensive physical and occupational therapy as well as follow up for her gastroparesis/dymotility.  If the reglan started in the hospital does not improve her symptoms she will need GI evaluation and likely an upper endoscopy.  Discharge Orders    Future Appointments: Provider: Department: Dept Phone: Center:   08/26/2012 10:30 AM Ladene Artist, MD [facility] [phone] None     Future Orders Please Complete By Expires   Diet general      Comments:   Gastroparesis diet:  Low fiber, low residue.  No salad or raw vegatables.   Increase activity slowly        Follow-up Information    Follow up with Rogelia Boga, MD in 2 weeks.   Contact information:   [address] [place] [address] [phone]         Time spent on Discharge: 40 min  Signed: [name] 04/18/2012, 11:02 AM [phone]  Attending - I have seen and examined the patient, I agree with the assessment and plan.  She has no further nausea/vomiting or diarrhea. She is agreeable to no transfer to a ALF. I personally discussed with Dr Ferol Luz today, patient does  have capacity, and she is recommending that we start her on Wellbutrin.Patient's abdomen is soft and not distended, and is tolerating a diet well.If she were to have recurrent vomiting-she will need a GI referral at some point in time  Dr Windell Norfolk

## 2012-04-18 NOTE — Progress Notes (Signed)
Clinical social worker left message with Kara Mead from [facility] regarding pt dc and to confirm bed offer. CSW continuing to follow to assist with pt dc plans.   Catha Gosselin, Theresia Majors  [phone] .04/18/2012 950am

## 2012-04-18 NOTE — Progress Notes (Signed)
Clinical social worker spoke with Kara Mead at [facility] who confirmed that patient will be able to discharge to [facility] today. Kara Mead and Rn [name] will be coming to pt room to complete admission paperwork. CSW will provide facility with clinicals needed. Facility staff should be at pt room between 1230 and 1pm.   .Clinical social worker continuing to follow pt to assist with pt dc plans and further csw needs.   Catha Gosselin, Theresia Majors  [phone] .04/18/2012 1215pm

## 2012-04-21 NOTE — ED Provider Notes (Signed)
Medical screening examination/treatment/procedure(s) were performed by non-physician practitioner and as supervising physician I was immediately available for consultation/collaboration.  Raeford Razor, MD 04/21/12 1015

## 2012-08-26 ENCOUNTER — Telehealth: Payer: Self-pay | Admitting: Oncology

## 2012-08-26 ENCOUNTER — Ambulatory Visit (HOSPITAL_BASED_OUTPATIENT_CLINIC_OR_DEPARTMENT_OTHER): Payer: No Typology Code available for payment source | Admitting: Oncology

## 2012-08-26 VITALS — BP 122/98 | HR 97 | Temp 97.0°F | Resp 20 | Ht 64.0 in | Wt 114.1 lb

## 2012-08-26 DIAGNOSIS — G35 Multiple sclerosis: Secondary | ICD-10-CM

## 2012-08-26 DIAGNOSIS — Z853 Personal history of malignant neoplasm of breast: Secondary | ICD-10-CM

## 2012-08-26 NOTE — Progress Notes (Signed)
   [facility]    OFFICE PROGRESS NOTE   INTERVAL HISTORY:   She returns as scheduled. She is now living in a nursing Center. No new complaint. No change over either breast. She has not completed a mammogram this year.  Objective:  Vital signs in last 24 hours:  Blood pressure 122/98, pulse 97, temperature 97 F (36.1 C), temperature source Oral, resp. rate 20, height 5\' 4"  (1.626 m), weight 114 lb 1.6 oz (51.755 kg), last menstrual period 08/09/2008.    HEENT: Neck without mass Lymphatics: No cervical, supraclavicular, or axillary node Resp: Lungs clear bilaterally Cardio: Regular rate and rhythm GI: No hepatomegaly Vascular: No leg edema Breasts: Status post right lumpectomy. No evidence for local tumor recurrence. No mass in either breast   Medications: I have reviewed the patient's current medications.  Assessment/Plan: 1-breast cancer, stage II right-sided breast cancer diagnosed in November of 2002. She completed adjuvant AC chemotherapy and breast radiation.  -She began tamoxifen in April 2003 and remained on tamoxifen until March of 2006.  -She began Aromasin in April 2006 and completed 5 years of Aromasin at the end of March 2011.  #2-multiple sclerosis  Disposition:  She remains in clinical remission from breast cancer. She will be scheduled for a mammogram within the next one month.  Ms. Pollman will return for an office visit in one year.   Thornton Papas, MD  08/26/2012  11:01 AM

## 2012-08-26 NOTE — Patient Instructions (Signed)
[facility] Discharge Instructions  Your exam findings, labs and results were discussed with your MD today.   Please visit scheduling to obtain calendar for future appointments.  Please call the [facility] at [phone] during business hours should you have any further questions or need assistance in obtaining follow-up care. If you have a medical emergency, please dial 911.  Special Instructions:         

## 2012-08-26 NOTE — Telephone Encounter (Signed)
Gave pt appt for September 2014 MD visit only, pt will have mammogram on 09/04/12 @ [facility] 11:15am

## 2012-08-29 ENCOUNTER — Telehealth: Payer: Self-pay | Admitting: Oncology

## 2012-08-29 NOTE — Telephone Encounter (Signed)
Tami Ribas 08/26/2012 12:13 PM Signed  Jovita Gamma pt appt for September 2014 MD visit only, pt will have mammogram on 09/04/12 @ [facility] 11:15am

## 2012-09-16 ENCOUNTER — Ambulatory Visit (INDEPENDENT_AMBULATORY_CARE_PROVIDER_SITE_OTHER): Payer: No Typology Code available for payment source | Admitting: Gynecology

## 2012-09-16 ENCOUNTER — Encounter: Payer: Self-pay | Admitting: Gynecology

## 2012-09-16 VITALS — BP 112/88

## 2012-09-16 DIAGNOSIS — Z01419 Encounter for gynecological examination (general) (routine) without abnormal findings: Secondary | ICD-10-CM

## 2012-09-16 DIAGNOSIS — N952 Postmenopausal atrophic vaginitis: Secondary | ICD-10-CM

## 2012-09-16 DIAGNOSIS — M810 Age-related osteoporosis without current pathological fracture: Secondary | ICD-10-CM

## 2012-09-16 NOTE — Patient Instructions (Signed)
Arrange for repeat bone density through Dr. Kalman Drape office. Call me if you have any problems arranging this. Arrange for screening colonoscopy over this coming year. Follow up in one year for gynecologic exam.

## 2012-09-16 NOTE — Progress Notes (Signed)
Stephanie Zhang 04-20-1961 [id number]        51 y.o.  G1P0010 for follow up exam.  History of multiple sclerosis requiring wheelchair.  Several issues noted below.  Past medical history,surgical history, medications, allergies, family history and social history were all reviewed and documented in the EPIC chart. ROS:  Was performed and pertinent positives and negatives are included in the history.  Exam: Fleet Contras assistant Filed Vitals:   09/16/12 1154  BP: 112/88   General appearance  Normal Skin grossly normal Head/Neck normal with no cervical or supraclavicular adenopathy thyroid normal Lungs  clear Cardiac RR, without RMG Abdominal  soft, nontender, without masses, organomegaly or hernia Breasts  examined lying and sitting. Left without masses, retractions, discharge or axillary adenopathy. Right status post lumpectomy/radiation scar well-healed. No acute changes, masses discharge or adenopathy Pelvic  Ext/BUS/vagina  normal with atrophic changes  Cervix  normal   Uterus  axial, normal size, shape and contour, midline and mobile nontender   Adnexa  Without masses or tenderness    Anus and perineum  normal   Rectovaginal  normal sphincter tone without palpated masses or tenderness.    Assessment/Plan:  51 y.o. G70P0010 female for annual exam.   1. History of stage II right breast cancer status post chemotherapy/radiation and tamoxifen/Aromasin treatment. On no current medications. Doing well. Her recent mammogram normal. Continue with annual mammography and breast exams. Recent follow up with Dr. Colette Ribas 08/2012.  No bleeding or significant menopausal symptoms. Continue to monitor. 2. Pap smear. No Pap smear done today. A spastic 2012. No history of abnormal Pap smears previously. We'll plan less frequent screening every 3-5 years. 3. Mammography. September 2013 normal. Continue with annual screening. 4. Osteoporosis. Historically on her history. No DEXA studies available. She  has this done through Dr. Kalman Drape office and I recommended they call him to arrange for follow up DEXA as her sister relates it's been over 3 years since her last study. They will follow up with him in reference to this. 5. Colonoscopy. Recommended they arrange screening colonoscopy as she is turning 50 in they agreed to arrange this. 6. Health maintenance. No blood work done as this is all done through her other physician's office. Follow up one year, sooner as needed.   Dara Lords MD, 12:28 PM 09/16/2012

## 2012-09-25 ENCOUNTER — Telehealth: Payer: Self-pay | Admitting: *Deleted

## 2012-09-25 NOTE — Telephone Encounter (Signed)
POA called to inquire where and when last bone density testing was done.

## 2012-09-26 NOTE — Telephone Encounter (Signed)
Notified sister that last bone density was 12/13/09 at [facility] at [facility].

## 2012-10-06 ENCOUNTER — Encounter: Payer: Self-pay | Admitting: *Deleted

## 2012-10-06 NOTE — Progress Notes (Signed)
Faxed order to [facility] for mammogram  [phone]

## 2012-12-18 ENCOUNTER — Telehealth: Payer: Self-pay | Admitting: Internal Medicine

## 2012-12-18 NOTE — Telephone Encounter (Signed)
Tonya calling regarding area in left, lower abd. Red, tender to touch, inflammed. Neosporin previously ordered, but the area actually looks worse. Please advise and call Tonya. Thanks.

## 2012-12-18 NOTE — Telephone Encounter (Signed)
Keflex 500 mg 4 times daily for 10 days. Office visit or urgent care visit if unimproved or if patient develops fever

## 2012-12-19 NOTE — Telephone Encounter (Signed)
Spoke to [place] told her will start on Keflex 500 mg one tablet by mouth four times a day x 10 days if pt does not improve or develops fever needs office visit or urgent care visit. Stephanie Zhang verbalized understanding and stated to fax order to [facility] at [phone]. Told her okay. Order faxed.

## 2013-02-23 ENCOUNTER — Ambulatory Visit (INDEPENDENT_AMBULATORY_CARE_PROVIDER_SITE_OTHER): Payer: No Typology Code available for payment source | Admitting: Internal Medicine

## 2013-02-23 ENCOUNTER — Encounter: Payer: Self-pay | Admitting: Internal Medicine

## 2013-02-23 VITALS — BP 120/80 | HR 87 | Temp 97.6°F | Resp 16 | Wt 106.0 lb

## 2013-02-23 DIAGNOSIS — R5381 Other malaise: Secondary | ICD-10-CM

## 2013-02-23 DIAGNOSIS — Z0000 Encounter for general adult medical examination without abnormal findings: Secondary | ICD-10-CM

## 2013-02-23 DIAGNOSIS — Z853 Personal history of malignant neoplasm of breast: Secondary | ICD-10-CM

## 2013-02-23 DIAGNOSIS — R269 Unspecified abnormalities of gait and mobility: Secondary | ICD-10-CM

## 2013-02-23 DIAGNOSIS — G35 Multiple sclerosis: Secondary | ICD-10-CM

## 2013-02-23 LAB — LIPID PANEL
Cholesterol: 196 mg/dL (ref 0–200)
HDL: 69.2 mg/dL (ref >39.00)
VLDL: 28.2 mg/dL (ref 0.0–40.0)

## 2013-02-23 LAB — COMPREHENSIVE METABOLIC PANEL
Albumin: 3.5 g/dL (ref 3.5–5.2)
Alkaline Phosphatase: 119 U/L — ABNORMAL HIGH (ref 39–117)
BUN: 12 mg/dL (ref 6–23)
CO2: 27 mEq/L (ref 19–32)
GFR: 68.33 mL/min (ref >60.00)
Glucose, Bld: 95 mg/dL (ref 70–99)
Potassium: 3.5 mEq/L (ref 3.5–5.1)
Sodium: 140 mEq/L (ref 135–145)
Total Bilirubin: 0.8 mg/dL (ref 0.3–1.2)
Total Protein: 7 g/dL (ref 6.0–8.3)

## 2013-02-23 LAB — CBC WITH DIFFERENTIAL/PLATELET
Basophils Relative: 0.3 % (ref 0.0–3.0)
Eosinophils Relative: 0.7 % (ref 0.0–5.0)
MCV: 91.8 fl (ref 78.0–100.0)
Monocytes Relative: 7.2 % (ref 3.0–12.0)
Neutrophils Relative %: 78.2 % — ABNORMAL HIGH (ref 43.0–77.0)
Platelets: 167 10*3/uL (ref 150.0–400.0)
RBC: 4.41 Mil/uL (ref 3.87–5.11)
WBC: 5.8 10*3/uL (ref 4.5–10.5)

## 2013-02-23 LAB — TSH: TSH: 1.3 u[IU]/mL (ref 0.35–5.50)

## 2013-02-23 MED ORDER — BUPROPION HCL 75 MG PO TABS: 150.0000 mg | ORAL_TABLET | Freq: Every day | ORAL | Status: DC

## 2013-02-23 MED ORDER — PANTOPRAZOLE SODIUM 40 MG PO TBEC: DELAYED_RELEASE_TABLET | ORAL | Status: DC

## 2013-02-23 MED ORDER — CLONAZEPAM 0.5 MG PO TABS: 0.5000 mg | ORAL_TABLET | Freq: Two times a day (BID) | ORAL | Status: DC | PRN

## 2013-02-23 MED ORDER — POLYETHYLENE GLYCOL 3350 17 G PO PACK: 17.0000 g | PACK | Freq: Every day | ORAL | Status: DC

## 2013-02-23 MED ORDER — METOCLOPRAMIDE HCL 10 MG PO TABS: 5.0000 mg | ORAL_TABLET | Freq: Three times a day (TID) | ORAL | Status: DC

## 2013-02-23 MED ORDER — OXYBUTYNIN CHLORIDE 5 MG PO TABS: 5.0000 mg | ORAL_TABLET | Freq: Every day | ORAL | Status: DC

## 2013-02-23 MED ORDER — AMANTADINE HCL 100 MG PO CAPS: 100.0000 mg | ORAL_CAPSULE | Freq: Two times a day (BID) | ORAL | Status: DC

## 2013-02-23 NOTE — Progress Notes (Signed)
Subjective:    Patient ID: Stephanie Zhang, female    DOB: March 31, 1961, 52 y.o.   MRN: [id number]  HPI  52 year old patient who is seen today for a preventive health examination. She is followed by [facility] due 2 relapsing/remitting MS. She receives interferon injections 3 times weekly. She has remote history of breast cancer. At the present time [facility]; she is minimally ambulatory with assistance of a walker. She basically is wheelchair bound do to weakness and ataxia. She has remote history of breast cancer. No prior screening colonoscopies. She is followed by gynecology.  Past Medical History  Diagnosis Date  . MS (multiple sclerosis) 1987  . Hypoglycemia   . Breast cancer 10/2001    Stage II (right side)  . Multiple sclerosis   . GERD (gastroesophageal reflux disease)   . Osteoporosis   . Anxiety   . Blood transfusion     "when I was born"  . Headache     "lots"  . Chronic back pain   . Depression   . Confusion   . Short-term memory loss     History   Social History  . Marital Status: Widowed    Spouse Name: N/A    Number of Children: N/A  . Years of Education: N/A   Occupational History  . Not on file.   Social History Main Topics  . Smoking status: Former Smoker    Quit date: 05/11/2011  . Smokeless tobacco: Never Used  . Alcohol Use: No     Comment: "last alcohol 12/04/11"  . Drug Use: No     Comment: "last drug use ~ 1980"  . Sexually Active: No   Other Topics Concern  . Not on file   Social History Narrative  . No narrative on file    Past Surgical History  Procedure Laterality Date  . Breast lumpectomy  2002    right  . Breast biopsy  2002    right    Family History  Problem Relation Age of Onset  . Diabetes Mother   . Cancer Mother     cervical cancer  . Heart disease Mother   . Diabetes Father     Allergies  Allergen Reactions  . Sulfa Antibiotics Itching and Rash    "Zhang time since I had  breakout"  . Sulfacetamide Sodium Itching and Rash    Current Outpatient Prescriptions on File Prior to Visit  Medication Sig Dispense Refill  . amantadine (SYMMETREL) 100 MG capsule Take 100 mg by mouth 2 (two) times daily.        [name] buPROPion (WELLBUTRIN) 75 MG tablet Take 2 tablets (150 mg total) by mouth daily.  60 tablet  0  . Calcium-Vitamin D (CALTRATE 600 PLUS-VIT D PO) Take 1 tablet by mouth daily.       [name] docusate sodium (COLACE) 100 MG capsule Take 100 mg by mouth 2 (two) times daily.      [name] ibuprofen (ADVIL,MOTRIN) 800 MG tablet Take 800 mg by mouth every 8 (eight) hours as needed. As needed for pain.      [name] interferon beta-1a (REBIF) 44 MCG/0.5ML injection Inject 44 mcg into the skin 3 (three) times a week.       . loperamide (IMODIUM) 2 MG capsule Take 2 mg by mouth every 4 (four) hours as needed.      . metoCLOPramide (REGLAN) 10 MG tablet Take 5 mg by mouth 4 (four) times daily -  before meals and at bedtime.      [name] oxybutynin (DITROPAN) 5 MG tablet Take 5 mg by mouth daily.        . pantoprazole (PROTONIX) 40 MG tablet TAKE 1 TABLET DAILY  60 tablet  0  . polyethylene glycol (MIRALAX / GLYCOLAX) packet Take 17 g by mouth daily.       No current facility-administered medications on file prior to visit.    BP 120/80  Pulse 87  Temp(Src) 97.6 F (36.4 C) (Oral)  Resp 16  Wt 106 lb (48.081 kg)  BMI 18.19 kg/m2  SpO2 98%  LMP 08/09/2008      Review of Systems  Constitutional: Negative for fever, appetite change, fatigue and unexpected weight change.  HENT: Negative for hearing loss, ear pain, nosebleeds, congestion, sore throat, mouth sores, trouble swallowing, neck stiffness, dental problem, voice change, sinus pressure and tinnitus.   Eyes: Negative for photophobia, pain, redness and visual disturbance.  Respiratory: Negative for cough, chest tightness and shortness of breath.   Cardiovascular: Negative for chest pain, palpitations and leg swelling.   Gastrointestinal: Negative for nausea, vomiting, abdominal pain, diarrhea, constipation, blood in stool, abdominal distention and rectal pain.  Genitourinary: Positive for difficulty urinating. Negative for dysuria, urgency, frequency, hematuria, flank pain, vaginal bleeding, vaginal discharge, genital sores, vaginal pain, menstrual problem and pelvic pain.  Musculoskeletal: Negative for back pain and arthralgias.  Skin: Negative for rash.  Neurological: Negative for dizziness, syncope, speech difficulty, weakness, light-headedness, numbness and headaches.  Hematological: Negative for adenopathy. Does not bruise/bleed easily.  Psychiatric/Behavioral: Negative for suicidal ideas, behavioral problems, self-injury, dysphoric mood and agitation. The patient is not nervous/anxious.        Objective:   Physical Exam  Constitutional: She is oriented to person, place, and time. She appears well-developed and well-nourished.  HENT:  Head: Normocephalic.  Right Ear: External ear normal.  Left Ear: External ear normal.  Mouth/Throat: Oropharynx is clear and moist.  Eyes: Conjunctivae and EOM are normal. Pupils are equal, round, and reactive to light.  Neck: Normal range of motion. Neck supple. No thyromegaly present.  Cardiovascular: Normal rate, regular rhythm, normal heart sounds and intact distal pulses.   Pulmonary/Chest: Effort normal and breath sounds normal.  Right partial mastectomy  Abdominal: Soft. Bowel sounds are normal. She exhibits no mass. There is no tenderness.  Musculoskeletal: Normal range of motion.  Lymphadenopathy:    She has no cervical adenopathy.  Neurological: She is alert and oriented to person, place, and time. She displays abnormal reflex. Coordination abnormal.  Generally weak Poor heel-to-shin testing; More ataxic on the left  Reflexes generally suppressed  Skin: Skin is warm and dry. No rash noted.  Psychiatric: She has a normal mood and affect. Her behavior  is normal.          Assessment & Plan:   Preventive health examination. We'll set up for a screening colonoscopy History right breast cancer Multiple sclerosis with significant functional disability with profound weakness and ataxia.  Laboratory update reviewed Schedule colonoscopy Followup neurology Followup oncology

## 2013-02-23 NOTE — Patient Instructions (Signed)
Limit your sodium (Salt) intake  Schedule your colonoscopy to help detect colon cancer.  Return in one year for follow-up  Take a calcium supplement, plus [phone] units of vitamin D

## 2013-02-25 ENCOUNTER — Other Ambulatory Visit: Payer: Self-pay | Admitting: *Deleted

## 2013-02-25 MED ORDER — CLONAZEPAM 0.5 MG PO TABS: 0.5000 mg | ORAL_TABLET | Freq: Two times a day (BID) | ORAL | Status: DC | PRN

## 2013-02-25 NOTE — Telephone Encounter (Signed)
Rx for Clonazepam was printed, signed by Dr. Kirtland Bouchard and faxed to [facility] for pt.

## 2013-03-02 ENCOUNTER — Encounter: Payer: Self-pay | Admitting: Gastroenterology

## 2013-03-26 ENCOUNTER — Encounter: Payer: Self-pay | Admitting: *Deleted

## 2013-03-26 NOTE — Progress Notes (Signed)
Patient ID: Stephanie Zhang, female   DOB: 07/19/1961, 52 y.o.   MRN: [id number] Pt arrived with sister for PV today for screening colon scheduled with Dr. Russella Dar 5/1; referred by Dr. Amador Cunas.  Pt has MS; is wheelchair bound; unable to walk.  Pt has failure to thrive and malnutrition.  Office visit scheduled with Dr. Russella Dar on 5/7 at 10:15.  Colonoscopy scheduled for 5/1 cancelled.

## 2013-04-09 ENCOUNTER — Encounter: Payer: No Typology Code available for payment source | Admitting: Gastroenterology

## 2013-04-14 ENCOUNTER — Ambulatory Visit (INDEPENDENT_AMBULATORY_CARE_PROVIDER_SITE_OTHER): Payer: No Typology Code available for payment source | Admitting: Internal Medicine

## 2013-04-14 ENCOUNTER — Encounter: Payer: Self-pay | Admitting: Internal Medicine

## 2013-04-14 VITALS — BP 108/80 | HR 105 | Temp 98.5°F | Resp 20 | Wt 108.0 lb

## 2013-04-14 DIAGNOSIS — G35 Multiple sclerosis: Secondary | ICD-10-CM

## 2013-04-14 DIAGNOSIS — R269 Unspecified abnormalities of gait and mobility: Secondary | ICD-10-CM

## 2013-04-14 DIAGNOSIS — R627 Adult failure to thrive: Secondary | ICD-10-CM

## 2013-04-14 NOTE — Progress Notes (Signed)
Subjective:    Patient ID: Stephanie Zhang, female    DOB: 1961-10-02, 52 y.o.   MRN: [id number]  HPI  52 year old patient who is seen today for followup she has a history chronic MS and treatment includes interferon injections. She has developed the skin lesions involving her upper arms at prior injection sites. In general she has done fairly well she does have it history of weakness failure to thrive and remote history of breast cancer. In general doing fairly well. She now is cared for at an extended care facility.  Medical regimen reviewed  FL 2 forms reviewed and signed  Past Medical History  Diagnosis Date  . MS (multiple sclerosis) 1987  . Hypoglycemia   . Breast cancer 10/2001    Stage II (right side)  . Multiple sclerosis   . GERD (gastroesophageal reflux disease)   . Osteoporosis   . Anxiety   . Blood transfusion     "when I was born"  . Headache     "lots"  . Chronic back pain   . Depression   . Confusion   . Short-term memory loss     History   Social History  . Marital Status: Widowed    Spouse Name: N/A    Number of Children: N/A  . Years of Education: N/A   Occupational History  . Not on file.   Social History Main Topics  . Smoking status: Former Smoker    Quit date: 05/11/2011  . Smokeless tobacco: Never Used  . Alcohol Use: No     Comment: "last alcohol 12/04/11"  . Drug Use: No     Comment: "last drug use ~ 1980"  . Sexually Active: No   Other Topics Concern  . Not on file   Social History Narrative  . No narrative on file    Past Surgical History  Procedure Laterality Date  . Breast lumpectomy  2002    right  . Breast biopsy  2002    right    Family History  Problem Relation Age of Onset  . Diabetes Mother   . Cancer Mother     cervical cancer  . Heart disease Mother   . Diabetes Father     Allergies  Allergen Reactions  . Sulfa Antibiotics Itching and Rash    "Zhang time since I had breakout"  . Sulfacetamide Sodium  Itching and Rash    Current Outpatient Prescriptions on File Prior to Visit  Medication Sig Dispense Refill  . amantadine (SYMMETREL) 100 MG capsule Take 1 capsule (100 mg total) by mouth 2 (two) times daily.  180 capsule  4  . buPROPion (WELLBUTRIN) 75 MG tablet Take 2 tablets (150 mg total) by mouth daily.  60 tablet  0  . Calcium-Vitamin D (CALTRATE 600 PLUS-VIT D PO) Take 1 tablet by mouth daily.       . clonazePAM (KLONOPIN) 0.5 MG tablet Take 1 tablet (0.5 mg total) by mouth 2 (two) times daily as needed. As needed for anxiety.  30 tablet  5  . docusate sodium (COLACE) 100 MG capsule Take 100 mg by mouth 2 (two) times daily.      [name] ibuprofen (ADVIL,MOTRIN) 800 MG tablet Take 800 mg by mouth every 8 (eight) hours as needed. As needed for pain.      [name] interferon beta-1a (REBIF) 44 MCG/0.5ML injection Inject 44 mcg into the skin 3 (three) times a week.       . loperamide (IMODIUM) 2 MG  capsule Take 2 mg by mouth every 4 (four) hours as needed.      . metoCLOPramide (REGLAN) 10 MG tablet Take 0.5 tablets (5 mg total) by mouth 4 (four) times daily -  before meals and at bedtime.  120 tablet  4  . oxybutynin (DITROPAN) 5 MG tablet Take 1 tablet (5 mg total) by mouth daily.  90 tablet  4  . pantoprazole (PROTONIX) 40 MG tablet TAKE 1 TABLET DAILY  90 tablet  5  . polyethylene glycol (MIRALAX / GLYCOLAX) packet Take 17 g by mouth daily.  14 each  6  . [DISCONTINUED] oxybutynin (DITROPAN) 5 MG tablet Take 5 mg by mouth daily.         No current facility-administered medications on file prior to visit.    BP 108/80  Pulse 105  Temp(Src) 98.5 F (36.9 C) (Oral)  Resp 20  Wt 108 lb (48.988 kg)  BMI 18.53 kg/m2  SpO2 97%  LMP 08/09/2008       Review of Systems  Constitutional: Positive for fatigue.  HENT: Negative for hearing loss, congestion, sore throat, rhinorrhea, dental problem, sinus pressure and tinnitus.   Eyes: Negative for pain, discharge and visual disturbance.   Respiratory: Negative for cough and shortness of breath.   Cardiovascular: Negative for chest pain, palpitations and leg swelling.  Gastrointestinal: Negative for nausea, vomiting, abdominal pain, diarrhea, constipation, blood in stool and abdominal distention.  Genitourinary: Negative for dysuria, urgency, frequency, hematuria, flank pain, vaginal bleeding, vaginal discharge, difficulty urinating, vaginal pain and pelvic pain.  Musculoskeletal: Positive for gait problem. Negative for joint swelling and arthralgias.  Skin: Positive for wound. Negative for rash.  Neurological: Positive for weakness. Negative for dizziness, syncope, speech difficulty, numbness and headaches.  Hematological: Negative for adenopathy.  Psychiatric/Behavioral: Negative for behavioral problems, dysphoric mood and agitation. The patient is not nervous/anxious.        Objective:   Physical Exam  Constitutional: She is oriented to person, place, and time. She appears well-developed and well-nourished. No distress.  HENT:  Head: Normocephalic.  Right Ear: External ear normal.  Left Ear: External ear normal.  Mouth/Throat: Oropharynx is clear and moist.  Eyes: Conjunctivae and EOM are normal. Pupils are equal, round, and reactive to light.  Neck: Normal range of motion. Neck supple. No thyromegaly present.  Cardiovascular: Normal rate, regular rhythm, normal heart sounds and intact distal pulses.   Pulmonary/Chest: Effort normal and breath sounds normal.  Abdominal: Soft. Bowel sounds are normal. She exhibits no mass. There is no tenderness.  Musculoskeletal: Normal range of motion.  Lymphadenopathy:    She has no cervical adenopathy.  Neurological: She is alert and oriented to person, place, and time.  Skin: Skin is warm and dry. No rash noted.  Superficial clean appearing ulcers noted involving the upper arms bilaterally  Onychomycotic toe nails  Psychiatric: She has a normal mood and affect. Her behavior is  normal.          Assessment & Plan:   Clean healing ulcerations at prior injection sites. We'll continue local wound care MS Weakness History of breast cancer  Recheck 6 months or as needed Forms completed and signed

## 2013-04-14 NOTE — Patient Instructions (Signed)
.  Call or return to clinic prn if these symptoms worsen or fail to improve as anticipated.  Return in 6 months for follow-up  

## 2013-04-15 ENCOUNTER — Encounter: Payer: Self-pay | Admitting: Gastroenterology

## 2013-04-15 ENCOUNTER — Ambulatory Visit (INDEPENDENT_AMBULATORY_CARE_PROVIDER_SITE_OTHER): Payer: No Typology Code available for payment source | Admitting: Gastroenterology

## 2013-04-15 VITALS — BP 110/80 | HR 80 | Ht 64.0 in | Wt 108.0 lb

## 2013-04-15 DIAGNOSIS — K5900 Constipation, unspecified: Secondary | ICD-10-CM

## 2013-04-15 DIAGNOSIS — Z1211 Encounter for screening for malignant neoplasm of colon: Secondary | ICD-10-CM

## 2013-04-15 MED ORDER — PEG-KCL-NACL-NASULF-NA ASC-C 100 G PO SOLR: 1.0000 | Freq: Once | ORAL | Status: DC

## 2013-04-15 NOTE — Progress Notes (Signed)
History of Present Illness: This is a 51-year-old female accompanied by her caregiver. She has debilitating muscular sclerosis and is wheelchair-bound. She resides in the [facility]. She has ongoing mild constipation and relates occasional episodes of fecal incontinence. She was referred for colorectal cancer screening. She can provide some history however her caregiver provides history as well. Denies weight loss, abdominal pain, diarrhea, change in stool caliber, melena, hematochezia, nausea, vomiting, dysphagia, reflux symptoms, chest pain.  Review of Systems: Pertinent positive and negative review of systems were noted in the above HPI section. All other review of systems were otherwise negative.  Current Medications, Allergies, Past Medical History, Past Surgical History, Family History and Social History were reviewed in [facility] electronic medical record.  Physical Exam: General: Well developed, well nourished, thin, debilitated, no acute distress Head: Normocephalic and atraumatic Eyes:  sclerae anicteric, EOMI Ears: Normal auditory acuity Mouth: No deformity or lesions Neck: Supple, no masses or thyromegaly Lungs: Clear throughout to auscultation Heart: Regular rate and rhythm; no murmurs, rubs or bruits Abdomen: Soft, non tender and non distended. No masses, hepatosplenomegaly or hernias noted. Normal Bowel sounds Rectal: Deferred to colonoscopy Musculoskeletal: Symmetrical with no gross deformities  Skin: No lesions on visible extremities Pulses:  Normal pulses noted Extremities: No clubbing, cyanosis, edema or deformities noted Neurological: Alert oriented x 4, significant impairment secondary to muscular sclerosis Cervical Nodes:  No significant cervical adenopathy Inguinal Nodes: No significant inguinal adenopathy Psychological:  Alert and cooperative. Normal mood and affect  Assessment and Recommendations:  1. Colorectal cancer  screening, average risk. Ongoing constipation. Continue MiraLax daily to twice daily. Occasional fecal incontinence. Fecal incontinence likely secondary to muscular sclerosis. Will evaluate for other etiologies at colonoscopy. Schedule colonoscopy. The risks, benefits, and alternatives to colonoscopy with possible biopsy and possible polypectomy were discussed with the patient and they consent to proceed. The patient understands it might be difficult for her to complete the bowel prep and she plans to stay in the bathroom for the bowel prep.  

## 2013-04-15 NOTE — Patient Instructions (Addendum)
You have been scheduled for a colonoscopy. Please follow written instructions given to you at your visit today.  Please pick up your prep kit at the pharmacy within the next 1-3 days. If you use inhalers (even only as needed), please bring them with you on the day of your procedure. Your physician has requested that you go to www.startemmi.com and enter the access code given to you at your visit today. This web site gives a general overview about your procedure. However, you should still follow specific instructions given to you by our office regarding your preparation for the procedure.  Thank you for choosing me and [facility].  Malcolm T. Stark, Jr., MD., FACG  

## 2013-04-16 ENCOUNTER — Other Ambulatory Visit: Payer: Self-pay

## 2013-04-16 DIAGNOSIS — K5900 Constipation, unspecified: Secondary | ICD-10-CM

## 2013-04-20 ENCOUNTER — Encounter: Payer: Self-pay | Admitting: Gastroenterology

## 2013-04-21 ENCOUNTER — Ambulatory Visit (HOSPITAL_COMMUNITY)
Admission: RE | Admit: 2013-04-21 | Discharge: 2013-04-21 | Disposition: A | Discharge status: Home / Self Care | Payer: No Typology Code available for payment source | Source: Ambulatory Visit | Attending: Gastroenterology | Admitting: Gastroenterology

## 2013-04-21 ENCOUNTER — Encounter (HOSPITAL_COMMUNITY): Payer: Self-pay | Admitting: *Deleted

## 2013-04-21 ENCOUNTER — Encounter (HOSPITAL_COMMUNITY)
Admission: RE | Disposition: A | Discharge status: Home / Self Care | Payer: Self-pay | Source: Ambulatory Visit | Attending: Gastroenterology

## 2013-04-21 DIAGNOSIS — Z1211 Encounter for screening for malignant neoplasm of colon: Secondary | ICD-10-CM

## 2013-04-21 DIAGNOSIS — K648 Other hemorrhoids: Secondary | ICD-10-CM | POA: Insufficient documentation

## 2013-04-21 DIAGNOSIS — K5900 Constipation, unspecified: Secondary | ICD-10-CM | POA: Insufficient documentation

## 2013-04-21 DIAGNOSIS — R159 Full incontinence of feces: Secondary | ICD-10-CM | POA: Insufficient documentation

## 2013-04-21 DIAGNOSIS — G35 Multiple sclerosis: Secondary | ICD-10-CM | POA: Insufficient documentation

## 2013-04-21 DIAGNOSIS — Z993 Dependence on wheelchair: Secondary | ICD-10-CM | POA: Insufficient documentation

## 2013-04-21 HISTORY — PX: COLONOSCOPY: SHX5424

## 2013-04-21 SURGERY — COLONOSCOPY
Anesthesia: Moderate Sedation

## 2013-04-21 MED ORDER — FENTANYL CITRATE 0.05 MG/ML IJ SOLN
INTRAMUSCULAR | Status: DC | PRN
  Administered 2013-04-21 (×4): 25 ug via INTRAVENOUS

## 2013-04-21 MED ORDER — FENTANYL CITRATE 0.05 MG/ML IJ SOLN
INTRAMUSCULAR | Status: AC
  Filled 2013-04-21: qty 4

## 2013-04-21 MED ORDER — MIDAZOLAM HCL 5 MG/5ML IJ SOLN
INTRAMUSCULAR | Status: DC | PRN
  Administered 2013-04-21 (×4): 2 mg via INTRAVENOUS

## 2013-04-21 MED ORDER — SODIUM CHLORIDE 0.9 % IV SOLN: INTRAVENOUS | Status: DC

## 2013-04-21 MED ORDER — DIPHENHYDRAMINE HCL 50 MG/ML IJ SOLN
INTRAMUSCULAR | Status: DC | PRN
  Administered 2013-04-21 (×2): 12.5 mg via INTRAVENOUS

## 2013-04-21 MED ORDER — MIDAZOLAM HCL 10 MG/2ML IJ SOLN
INTRAMUSCULAR | Status: AC
  Filled 2013-04-21: qty 4

## 2013-04-21 MED ORDER — DIPHENHYDRAMINE HCL 50 MG/ML IJ SOLN
INTRAMUSCULAR | Status: AC
  Filled 2013-04-21: qty 1

## 2013-04-21 NOTE — Op Note (Signed)
[facility] [address] [place], [address]   COLONOSCOPY PROCEDURE REPORT  PATIENT: Stephanie Zhang, Stephanie Zhang  MR#: [id number] BIRTHDATE: [birth year] , 51  yrs. old GENDER: Female ENDOSCOPIST: Meryl Dare, MD, [facility] REFERRED [id number] Amador Cunas, M.D. PROCEDURE DATE:  04/21/2013 PROCEDURE:   Colonoscopy, screening ASA CLASS:   Class III INDICATIONS:average risk screening. MEDICATIONS: These medications were titrated to patient response per physician's verbal order, Fentanyl 100 mcg IV, Versed 8 mg IV, and Diphenhydramine (Benadryl) 25 mg IV DESCRIPTION OF PROCEDURE:   After the risks benefits and alternatives of the procedure were thoroughly explained, informed consent was obtained.  A digital rectal exam revealed no abnormalities of the rectum.   The     endoscope was introduced through the anus and advanced to the cecum, which was identified by both the appendix and ileocecal valve. No adverse events experienced.   The quality of the prep was excellent, using MoviPrep  The instrument was then slowly withdrawn as the colon was fully examined.  COLON FINDINGS: A normal appearing cecum, ileocecal valve, and appendiceal orifice were identified.  The ascending, hepatic flexure, transverse, splenic flexure, descending, sigmoid colon and rectum appeared unremarkable.  No polyps or cancers were seen. Retroflexed views revealed small internal hemorrhoids. The time to cecum=4 minutes 20 seconds.  Withdrawal time=10 minutes 30 seconds. The scope was withdrawn and the procedure completed.  COMPLICATIONS: There were no complications.  ENDOSCOPIC IMPRESSION: 1.  Normal colon 2.  Small internal hemorrhoids  RECOMMENDATIONS: 1.  Continue current colorectal screening recommendations for "routine risk" patients with a repeat colonoscopy in 10 years.   eSigned:  Meryl Dare, MD, [facility] 04/21/2013 11:19 AM

## 2013-04-21 NOTE — H&P (View-Only) (Signed)
History of Present Illness: This is a 52 year old female accompanied by her caregiver. She has debilitating muscular sclerosis and is wheelchair-bound. She resides in the [facility] center. She has ongoing mild constipation and relates occasional episodes of fecal incontinence. She was referred for colorectal cancer screening. She can provide some history however her caregiver provides history as well. Denies weight loss, abdominal pain, diarrhea, change in stool caliber, melena, hematochezia, nausea, vomiting, dysphagia, reflux symptoms, chest pain.  Review of Systems: Pertinent positive and negative review of systems were noted in the above HPI section. All other review of systems were otherwise negative.  Current Medications, Allergies, Past Medical History, Past Surgical History, Family History and Social History were reviewed in [employer] record.  Physical Exam: General: Well developed, well nourished, thin, debilitated, no acute distress Head: Normocephalic and atraumatic Eyes:  sclerae anicteric, EOMI Ears: Normal auditory acuity Mouth: No deformity or lesions Neck: Supple, no masses or thyromegaly Lungs: Clear throughout to auscultation Heart: Regular rate and rhythm; no murmurs, rubs or bruits Abdomen: Soft, non tender and non distended. No masses, hepatosplenomegaly or hernias noted. Normal Bowel sounds Rectal: Deferred to colonoscopy Musculoskeletal: Symmetrical with no gross deformities  Skin: No lesions on visible extremities Pulses:  Normal pulses noted Extremities: No clubbing, cyanosis, edema or deformities noted Neurological: Alert oriented x 4, significant impairment secondary to muscular sclerosis Cervical Nodes:  No significant cervical adenopathy Inguinal Nodes: No significant inguinal adenopathy Psychological:  Alert and cooperative. Normal mood and affect  Assessment and Recommendations:  1. Colorectal cancer  screening, average risk. Ongoing constipation. Continue MiraLax daily to twice daily. Occasional fecal incontinence. Fecal incontinence likely secondary to muscular sclerosis. Will evaluate for other etiologies at colonoscopy. Schedule colonoscopy. The risks, benefits, and alternatives to colonoscopy with possible biopsy and possible polypectomy were discussed with the patient and they consent to proceed. The patient understands it might be difficult for her to complete the bowel prep and she plans to stay in the bathroom for the bowel prep.

## 2013-04-21 NOTE — Interval H&P Note (Signed)
History and Physical Interval Note:  04/21/2013 8:51 AM  Stephanie Zhang  has presented today for surgery, with the diagnosis of Special screening for malignant neoplasms, colon [V76.51]  The various methods of treatment have been discussed with the patient and family. After consideration of risks, benefits and other options for treatment, the patient has consented to  Procedure(s): COLONOSCOPY (N/A) as a surgical intervention .  The patient's history has been reviewed, patient examined, no change in status, stable for surgery.  I have reviewed the patient's chart and labs.  Questions were answered to the patient's satisfaction.     Venita Lick. Russella Dar MD Clementeen Graham

## 2013-04-22 ENCOUNTER — Encounter (HOSPITAL_COMMUNITY): Payer: Self-pay | Admitting: Gastroenterology

## 2013-05-14 ENCOUNTER — Ambulatory Visit (INDEPENDENT_AMBULATORY_CARE_PROVIDER_SITE_OTHER): Payer: No Typology Code available for payment source | Admitting: Family Medicine

## 2013-05-14 VITALS — HR 102 | Temp 98.2°F | Wt 108.0 lb

## 2013-05-14 DIAGNOSIS — T07XXXA Unspecified multiple injuries, initial encounter: Secondary | ICD-10-CM

## 2013-05-14 DIAGNOSIS — L98491 Non-pressure chronic ulcer of skin of other sites limited to breakdown of skin: Secondary | ICD-10-CM

## 2013-05-14 MED ORDER — MUPIROCIN 2 % EX OINT: TOPICAL_OINTMENT | Freq: Two times a day (BID) | CUTANEOUS | Status: DC

## 2013-05-14 NOTE — Addendum Note (Signed)
Addended by: Azucena Freed on: 05/14/2013 03:39 PM   Modules accepted: Orders

## 2013-05-14 NOTE — Progress Notes (Signed)
Chief Complaint  Patient presents with  . Open Wound    Pt has a wound on both arms due to injections she is receiving.  She has had antibiotics to treat this but the wounds still look very bad.    HPI:  Acute visit for skin lesions on arms: -52 yo pt of Dr. Charm Rings currently in SNF for debilitating muscular sclerosis, wheelchair bound, getting interferon injections -reports chronic wounds for months at injections sites, recently tx with abx per her report but not much better -denies: fevers, chills, malaise, drainage from wounds -saw PCP 1 month ago for this per her report and she report wound on L arm getting better, but wound on R arm getting worse -reports just finished course of oral abx for this  ROS: See pertinent positives and negatives per HPI.  Past Medical History  Diagnosis Date  . MS (multiple sclerosis) 1987  . Hypoglycemia   . Multiple sclerosis   . GERD (gastroesophageal reflux disease)   . Osteoporosis   . Anxiety   . Blood transfusion     "when I was born"  . Headache(784.0)     "lots"  . Chronic back pain   . Depression   . Confusion   . Short-term memory loss   . Breast cancer 10/2001    Stage II (right side)    Family History  Problem Relation Age of Onset  . Diabetes Mother   . Cancer Mother     cervical cancer  . Heart disease Mother   . Diabetes Father     History   Social History  . Marital Status: Widowed    Spouse Name: N/A    Number of Children: N/A  . Years of Education: N/A   Social History Main Topics  . Smoking status: Former Smoker    Quit date: 05/11/2011  . Smokeless tobacco: Never Used  . Alcohol Use: No     Comment: "last alcohol 12/04/11"  . Drug Use: No     Comment: "last drug use ~ 1980"  . Sexually Active: No   Other Topics Concern  . Not on file   Social History Narrative  . No narrative on file    Current outpatient prescriptions:amantadine (SYMMETREL) 100 MG capsule, Take 1 capsule (100 mg total) by mouth 2  (two) times daily., Disp: 180 capsule, Rfl: 4;  buPROPion (WELLBUTRIN) 75 MG tablet, Take 2 tablets (150 mg total) by mouth daily., Disp: 60 tablet, Rfl: 0;  Calcium-Vitamin D (CALTRATE 600 PLUS-VIT D PO), Take 1 tablet by mouth daily. , Disp: , Rfl:  clonazePAM (KLONOPIN) 0.5 MG tablet, Take 1 tablet (0.5 mg total) by mouth 2 (two) times daily as needed. As needed for anxiety., Disp: 30 tablet, Rfl: 5;  docusate sodium (COLACE) 100 MG capsule, Take 100 mg by mouth 2 (two) times daily., Disp: , Rfl: ;  ibuprofen (ADVIL,MOTRIN) 800 MG tablet, Take 800 mg by mouth every 8 (eight) hours as needed. As needed for pain., Disp: , Rfl:  interferon beta-1a (REBIF) 44 MCG/0.5ML injection, Inject 44 mcg into the skin 3 (three) times a week. , Disp: , Rfl: ;  loperamide (IMODIUM) 2 MG capsule, Take 2 mg by mouth every 4 (four) hours as needed., Disp: , Rfl: ;  metoCLOPramide (REGLAN) 10 MG tablet, Take 0.5 tablets (5 mg total) by mouth 4 (four) times daily -  before meals and at bedtime., Disp: 120 tablet, Rfl: 4 oxybutynin (DITROPAN) 5 MG tablet, Take 1 tablet (5 mg  total) by mouth daily., Disp: 90 tablet, Rfl: 4;  pantoprazole (PROTONIX) 40 MG tablet, TAKE 1 TABLET DAILY, Disp: 90 tablet, Rfl: 5;  peg 3350 powder (MOVIPREP) 100 G SOLR, Take 1 kit (100 g total) by mouth once., Disp: 1 kit, Rfl: 0;  polyethylene glycol (MIRALAX / GLYCOLAX) packet, Take 17 g by mouth daily., Disp: 14 each, Rfl: 6 mupirocin ointment (BACTROBAN) 2 %, Apply topically 2 (two) times daily., Disp: 22 g, Rfl: 0;  [DISCONTINUED] oxybutynin (DITROPAN) 5 MG tablet, Take 5 mg by mouth daily.  , Disp: , Rfl:   EXAM:  Filed Vitals:   05/14/13 1326  Pulse: 102  Temp: 98.2 F (36.8 C)    Body mass index is 18.53 kg/(m^2).  GENERAL: vitals reviewed and listed above, alert, oriented, appears well hydrated and in no acute distress  HEENT: atraumatic, conjunttiva clear, no obvious abnormalities on inspection of external nose and ears  NECK: no  obvious masses on inspection  SKIN: large 4.5x2.5 cm ulcer on R upper arm with eschar and hick dead tissue - no drainage, pus or surrounding induration or erythema, 2x1cm similar lesion on L upper extrem  MS: moves all extremities without noticeable abnormality  PSYCH: pleasant and cooperative, no obvious depression or anxiety  ASSESSMENT AND PLAN:  Discussed the following assessment and plan:  Non-healing non-surgical wound - Plan: Ambulatory referral to Wound Clinic, mupirocin ointment (BACTROBAN) 2 %, DISCONTINUED: mupirocin ointment (BACTROBAN) 2 %, DISCONTINUED: mupirocin ointment (BACTROBAN) 2 %  -wounds do not appear infected at this times -pt and family frustrated by chronicity of these wounds and feel they have gotten worse rather then better - discussed options -referred to wound clinic for debridement and further assessment and management to aide in healing these chronic ulcers - mupirocin and gauze dressing in interim for 1 week -the wound clinic will contact patient to arrange appointment in the next week -Patient advised to return or notify a doctor immediately if symptoms worsen or persist or new concerns arise.  There are no Patient Instructions on file for this visit.   Kriste Basque R.

## 2013-05-26 ENCOUNTER — Encounter (HOSPITAL_BASED_OUTPATIENT_CLINIC_OR_DEPARTMENT_OTHER): Payer: No Typology Code available for payment source | Attending: General Surgery

## 2013-05-26 DIAGNOSIS — G35 Multiple sclerosis: Secondary | ICD-10-CM | POA: Insufficient documentation

## 2013-05-26 DIAGNOSIS — L89899 Pressure ulcer of other site, unspecified stage: Secondary | ICD-10-CM | POA: Insufficient documentation

## 2013-05-26 DIAGNOSIS — L8993 Pressure ulcer of unspecified site, stage 3: Secondary | ICD-10-CM | POA: Insufficient documentation

## 2013-06-09 ENCOUNTER — Encounter (HOSPITAL_BASED_OUTPATIENT_CLINIC_OR_DEPARTMENT_OTHER): Payer: No Typology Code available for payment source | Attending: General Surgery

## 2013-06-09 DIAGNOSIS — L89899 Pressure ulcer of other site, unspecified stage: Secondary | ICD-10-CM | POA: Insufficient documentation

## 2013-06-09 DIAGNOSIS — L8993 Pressure ulcer of unspecified site, stage 3: Secondary | ICD-10-CM | POA: Insufficient documentation

## 2013-06-30 IMAGING — CR DG CHEST 2V
2 series · 2 of 2 positions shown · non-contrast
Comparison: 08/14/2006

CLINICAL DATA: Sarcoid, weight loss

CHEST - 2 VIEW

[w chest pa]
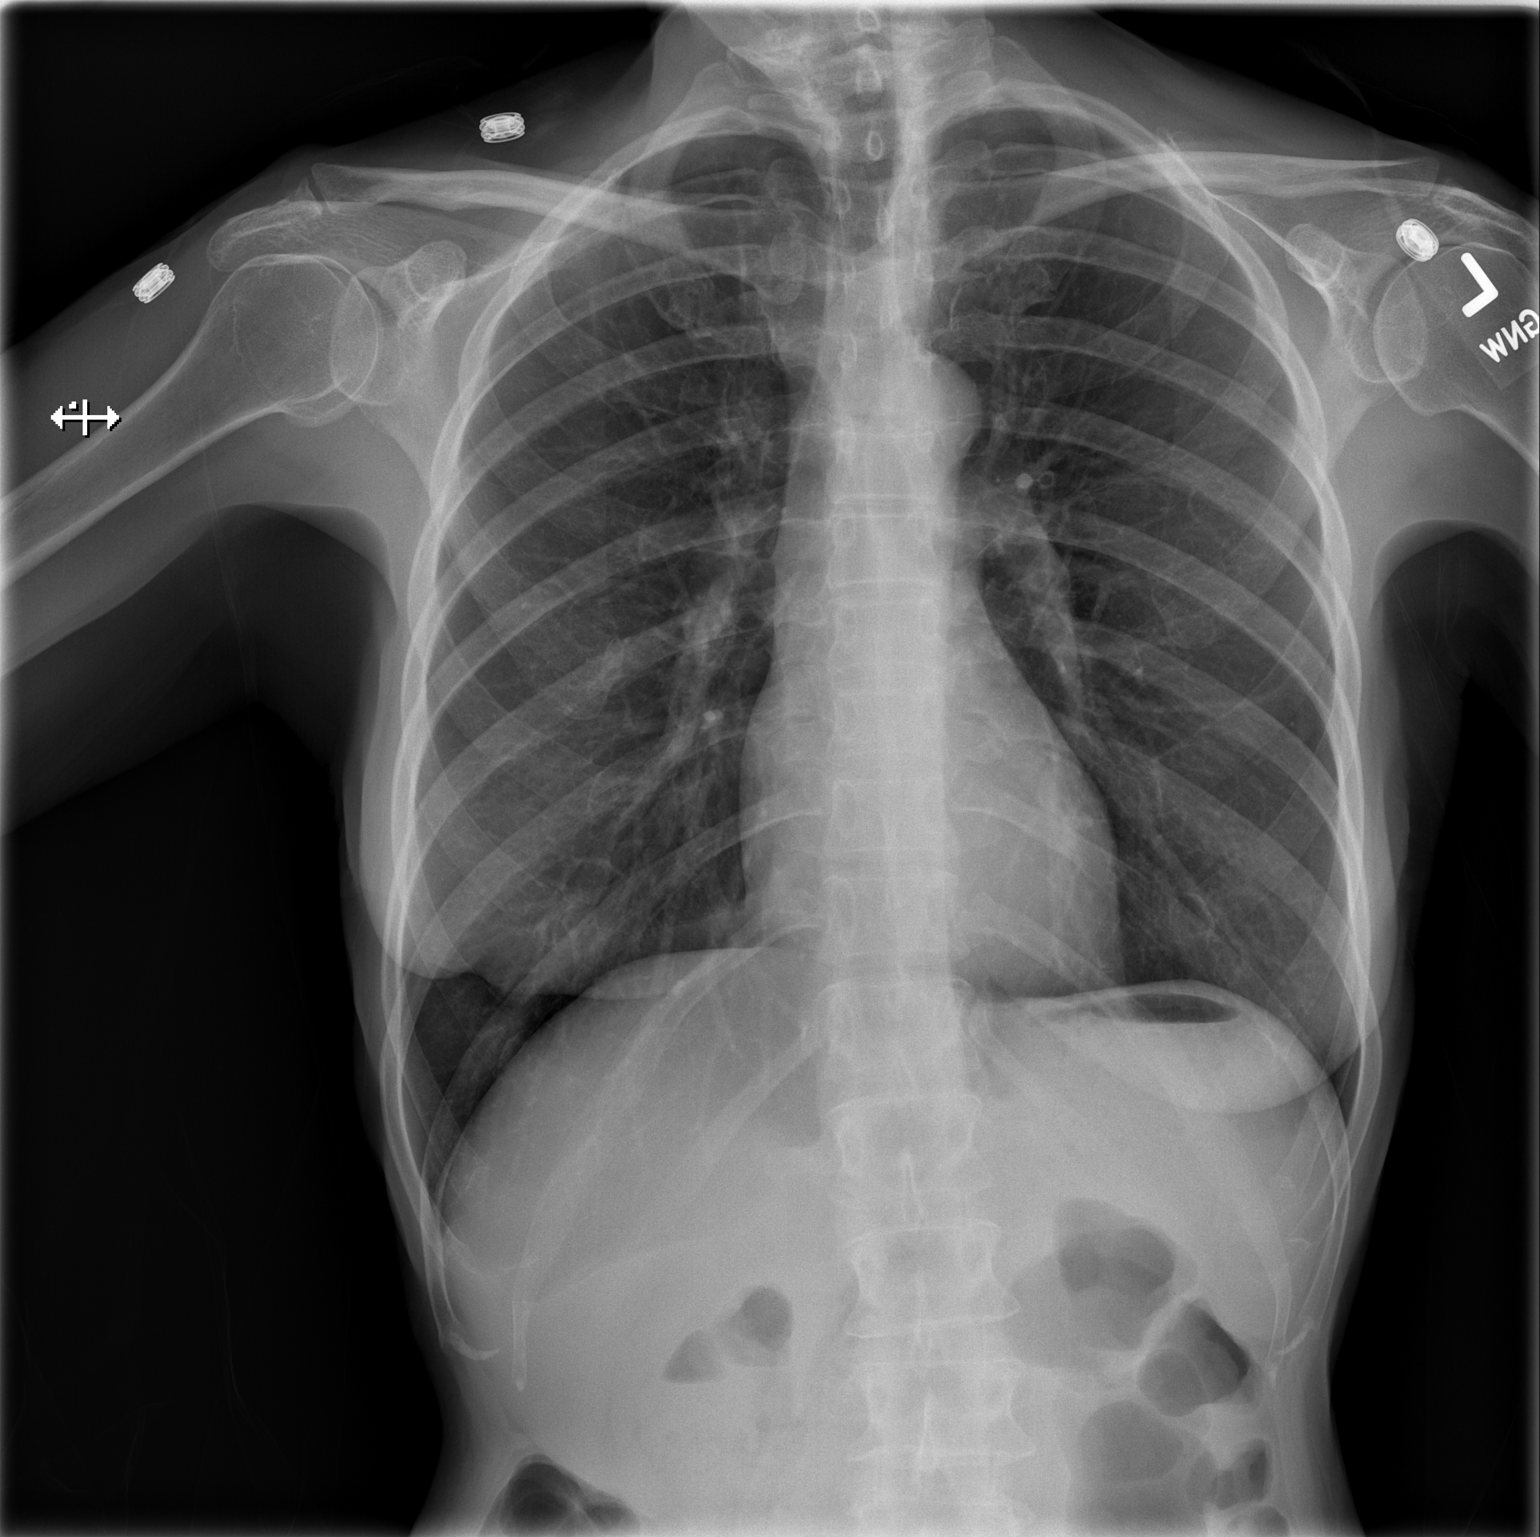

[w chest lat]
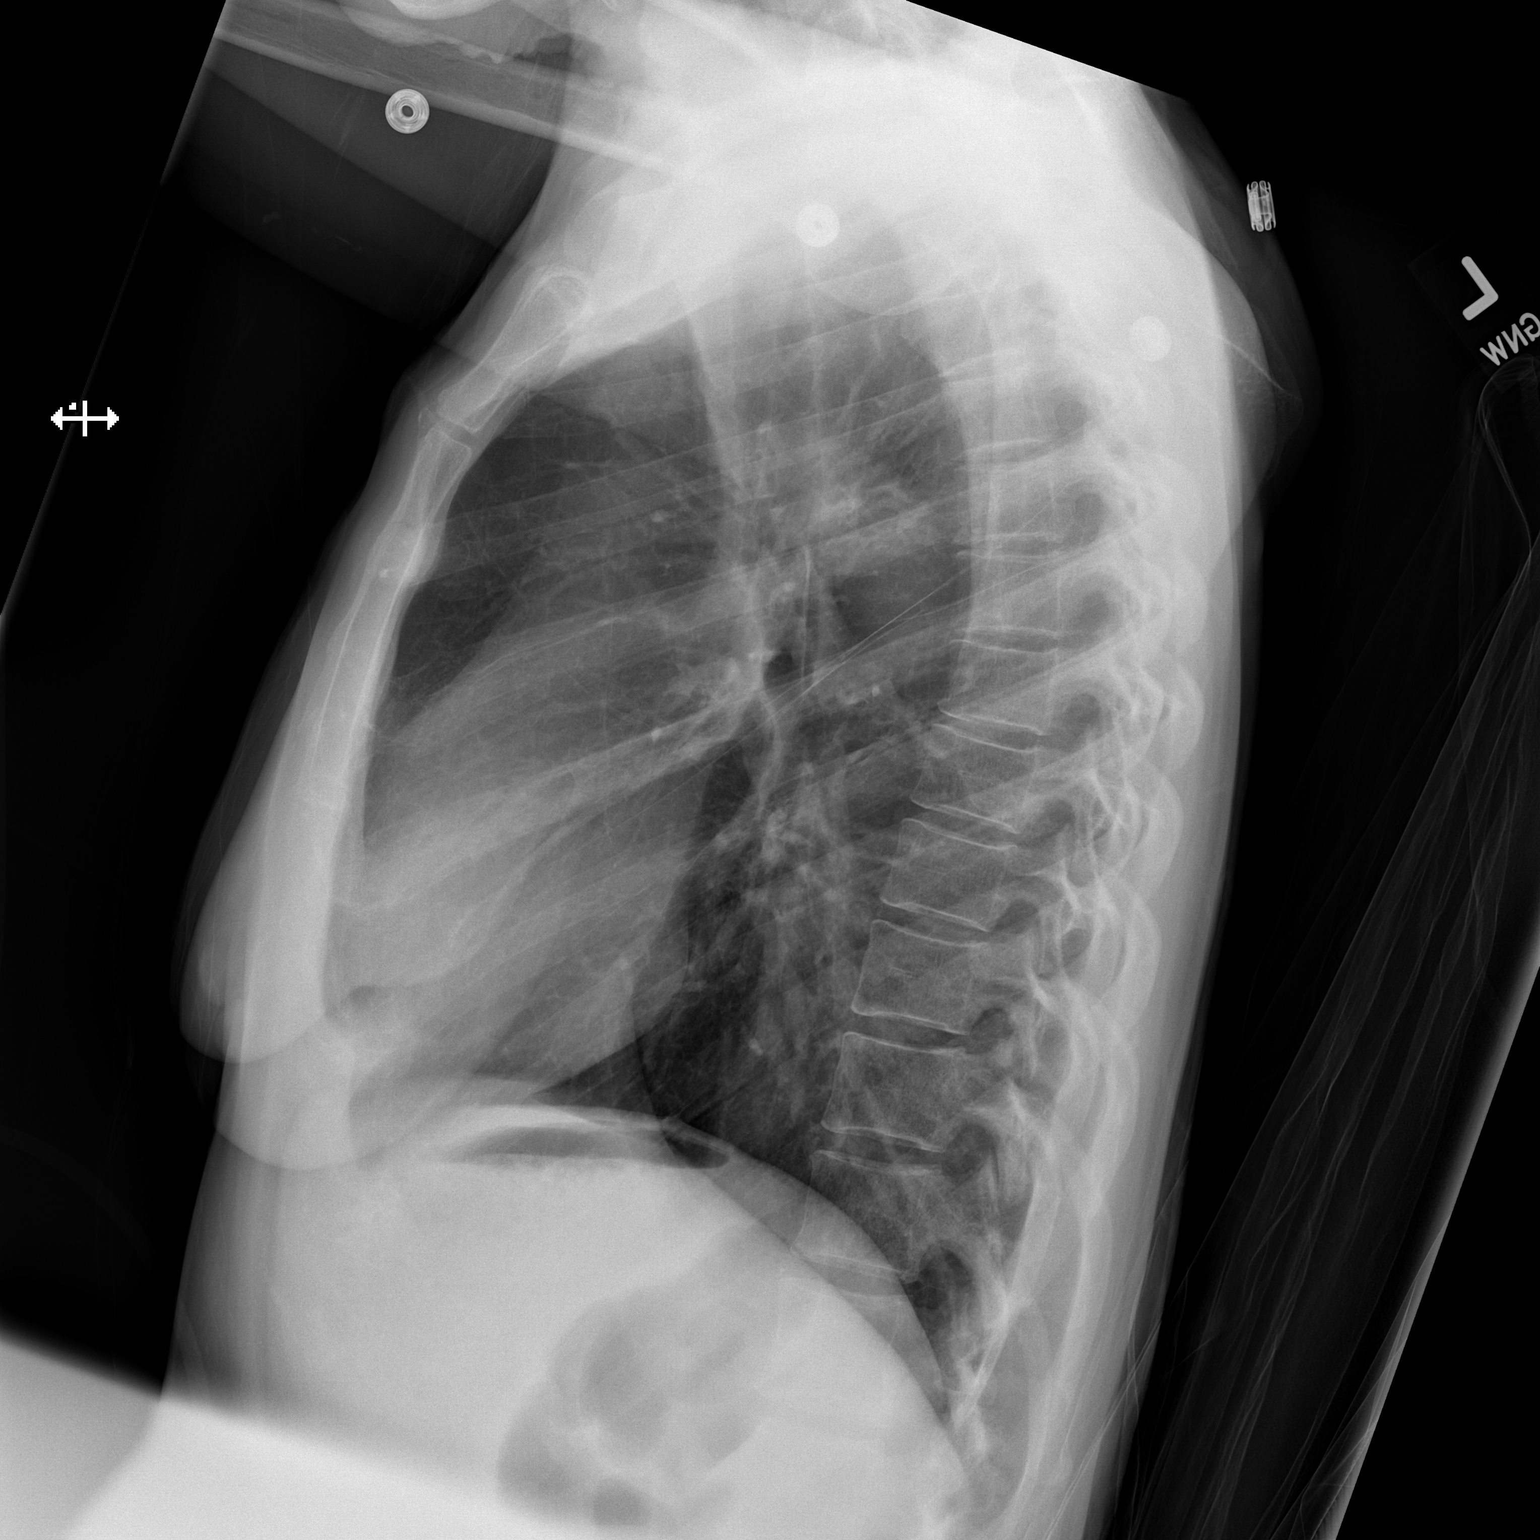

[2 of 2 positions shown; findings below may reference images not displayed]

FINDINGS: Lungs clear.  Heart size and pulmonary vascularity normal.  No
effusion.  Visualized bones unremarkable.
IMPRESSION: No acute disease

## 2013-07-14 ENCOUNTER — Encounter (HOSPITAL_BASED_OUTPATIENT_CLINIC_OR_DEPARTMENT_OTHER): Payer: No Typology Code available for payment source | Attending: General Surgery

## 2013-07-14 DIAGNOSIS — L8993 Pressure ulcer of unspecified site, stage 3: Secondary | ICD-10-CM | POA: Insufficient documentation

## 2013-07-14 DIAGNOSIS — L89899 Pressure ulcer of other site, unspecified stage: Secondary | ICD-10-CM | POA: Insufficient documentation

## 2013-08-27 ENCOUNTER — Ambulatory Visit (HOSPITAL_BASED_OUTPATIENT_CLINIC_OR_DEPARTMENT_OTHER): Payer: No Typology Code available for payment source | Admitting: Oncology

## 2013-08-27 VITALS — BP 133/94 | HR 96 | Temp 98.6°F | Resp 20 | Ht 64.0 in | Wt 113.5 lb

## 2013-08-27 DIAGNOSIS — Z853 Personal history of malignant neoplasm of breast: Secondary | ICD-10-CM

## 2013-08-27 NOTE — Progress Notes (Signed)
   [facility]    OFFICE PROGRESS NOTE   INTERVAL HISTORY:   She returns as scheduled. She continues interferon 3 days per week for treatment of multiple sclerosis. She developed skin breakdown at the injection sites and was treated at the wound center. No change over either breast. A mammogram on 09/04/2012 was negative.  Good appetite. She now lives in an assisted living facility.  Objective:  Vital signs in last 24 hours:  Blood pressure 133/94, pulse 96, temperature 98.6 F (37 C), temperature source Oral, resp. rate 20, height 5\' 4"  (1.626 m), weight 113 lb 8 oz (51.483 kg), last menstrual period 08/09/2008.    HEENT: Neck without mass Lymphatics: Pea-sized bilateral posterior cervical nodes, no supraclavicular or axillary node Resp: Lungs clear bilaterally Cardio: Regular rate and rhythm GI: No hepatomegaly Vascular: No leg edema Breasts: Status post right lumpectomy. No evidence for local tumor recurrence. No mass in either breast. Neurological: She ambulates with assistance  Medications: I have reviewed the patient's current medications.  Assessment/Plan: 1-breast cancer, stage II right-sided breast cancer diagnosed in November of 2002. She completed adjuvant AC chemotherapy and breast radiation.  -She began tamoxifen in April 2003 and remained on tamoxifen until March of 2006.  -She began Aromasin in April 2006 and completed 5 years of Aromasin at the end of March 2011.  #2-multiple sclerosis    Disposition:  Stephanie Zhang remains in clinical remission from breast cancer. She will arrange for a mammogram in October of 2014. She would like to continue followup at the cancer Center. She will return for an office visit in one year.   Thornton Papas, MD  08/27/2013  12:37 PM

## 2013-09-01 ENCOUNTER — Telehealth: Payer: Self-pay | Admitting: Oncology

## 2013-09-01 NOTE — Telephone Encounter (Signed)
, °

## 2013-11-03 ENCOUNTER — Ambulatory Visit (INDEPENDENT_AMBULATORY_CARE_PROVIDER_SITE_OTHER): Payer: No Typology Code available for payment source | Admitting: Internal Medicine

## 2013-11-03 ENCOUNTER — Encounter: Payer: Self-pay | Admitting: Internal Medicine

## 2013-11-03 VITALS — BP 130/80 | HR 91 | Temp 98.3°F | Resp 20

## 2013-11-03 DIAGNOSIS — R5381 Other malaise: Secondary | ICD-10-CM

## 2013-11-03 DIAGNOSIS — L853 Xerosis cutis: Secondary | ICD-10-CM

## 2013-11-03 DIAGNOSIS — L738 Other specified follicular disorders: Secondary | ICD-10-CM

## 2013-11-03 DIAGNOSIS — G35 Multiple sclerosis: Secondary | ICD-10-CM

## 2013-11-03 DIAGNOSIS — R269 Unspecified abnormalities of gait and mobility: Secondary | ICD-10-CM

## 2013-11-03 NOTE — Progress Notes (Signed)
Subjective:    Patient ID: Stephanie Zhang, female    DOB: 22-Aug-1961, 52 y.o.   MRN: [id number]  HPI Pre-visit discussion using our clinic review tool. No additional management support is needed unless otherwise documented below in the visit note.  52 year old patient who presents with a one-month history of a fairly generalized pruritic rash. This is most marked on the upper back arms and abdomen. It is described as mildly pruritic. No new medications or obvious irritants.    Past Medical History  Diagnosis Date  . MS (multiple sclerosis) 1987  . Hypoglycemia   . Multiple sclerosis   . GERD (gastroesophageal reflux disease)   . Osteoporosis   . Anxiety   . Blood transfusion     "when I was born"  . Headache(784.0)     "lots"  . Chronic back pain   . Depression   . Confusion   . Short-term memory loss   . Breast cancer 10/2001    Stage II (right side)    History   Social History  . Marital Status: Widowed    Spouse Name: N/A    Number of Children: N/A  . Years of Education: N/A   Occupational History  . Not on file.   Social History Main Topics  . Smoking status: Former Smoker    Quit date: 05/11/2011  . Smokeless tobacco: Never Used  . Alcohol Use: No     Comment: "last alcohol 12/04/11"  . Drug Use: No     Comment: "last drug use ~ 1980"  . Sexual Activity: No   Other Topics Concern  . Not on file   Social History Narrative  . No narrative on file    Past Surgical History  Procedure Laterality Date  . Breast lumpectomy  2002    right  . Breast biopsy  2002    right  . Colonoscopy N/A 04/21/2013    Procedure: COLONOSCOPY;  Surgeon: Meryl Dare, MD;  Location: [facility];  Service: Endoscopy;  Laterality: N/A;    Family History  Problem Relation Age of Onset  . Diabetes Mother   . Cancer Mother     cervical cancer  . Heart disease Mother   . Diabetes Father     Allergies  Allergen Reactions  . Sulfa Antibiotics Itching and Rash   "Zhang time since I had breakout"  . Sulfacetamide Sodium Itching and Rash    Current Outpatient Prescriptions on File Prior to Visit  Medication Sig Dispense Refill  . amantadine (SYMMETREL) 100 MG capsule Take 1 capsule (100 mg total) by mouth 2 (two) times daily.  180 capsule  4  . buPROPion (WELLBUTRIN) 75 MG tablet Take 2 tablets (150 mg total) by mouth daily.  60 tablet  0  . Calcium-Vitamin D (CALTRATE 600 PLUS-VIT D PO) Take 1 tablet by mouth daily.       [name] docusate sodium (COLACE) 100 MG capsule Take 100 mg by mouth 2 (two) times daily.      [name] ibuprofen (ADVIL,MOTRIN) 800 MG tablet Take 800 mg by mouth every 8 (eight) hours as needed. As needed for pain.      [name] interferon beta-1a (REBIF) 44 MCG/0.5ML injection Inject 44 mcg into the skin 3 (three) times a week.       . loperamide (IMODIUM) 2 MG capsule Take 2 mg by mouth every 4 (four) hours as needed.      [name] oxybutynin (DITROPAN) 5 MG tablet Take 1 tablet (5 mg  total) by mouth daily.  90 tablet  4  . pantoprazole (PROTONIX) 40 MG tablet TAKE 1 TABLET DAILY  90 tablet  5  . polyethylene glycol (MIRALAX / GLYCOLAX) packet Take 17 g by mouth daily.  14 each  6  . senna (SENOKOT) 8.6 MG tablet Take 1 tablet by mouth daily.      . [DISCONTINUED] oxybutynin (DITROPAN) 5 MG tablet Take 5 mg by mouth daily.         No current facility-administered medications on file prior to visit.    BP 130/80  Pulse 91  Temp(Src) 98.3 F (36.8 C) (Oral)  Resp 20  SpO2 97%  LMP 08/09/2008       Review of Systems  Constitutional: Negative.   HENT: Negative for congestion, dental problem, hearing loss, rhinorrhea, sinus pressure, sore throat and tinnitus.   Eyes: Negative for pain, discharge and visual disturbance.  Respiratory: Negative for cough and shortness of breath.   Cardiovascular: Negative for chest pain, palpitations and leg swelling.  Gastrointestinal: Negative for nausea, vomiting, abdominal pain, diarrhea, constipation, blood in  stool and abdominal distention.  Genitourinary: Negative for dysuria, urgency, frequency, hematuria, flank pain, vaginal bleeding, vaginal discharge, difficulty urinating, vaginal pain and pelvic pain.  Musculoskeletal: Negative for arthralgias, gait problem and joint swelling.  Skin: Positive for rash.  Neurological: Negative for dizziness, syncope, speech difficulty, weakness, numbness and headaches.  Hematological: Negative for adenopathy.  Psychiatric/Behavioral: Negative for behavioral problems, dysphoric mood and agitation. The patient is not nervous/anxious.        Objective:   Physical Exam  Constitutional: She appears well-developed and well-nourished. No distress.  Skin: Rash noted.  Scattered dry flaky slightly erythematous skin most marked involving the upper back and abdominal area Skin generally dry          Assessment & Plan:   Winter eczema. This appears to be a dry skin dermatitis. She lives in her room and has a room heater that produces very dry air. She does live in a facility but there has been no cases of scabies reported in this rash does not appear to be scabies. No hand dermatitis or any interdigital rashes. We'll treat with moisturizing cream and topical steroids where symptomatic and observe

## 2013-11-03 NOTE — Patient Instructions (Addendum)
Use a moisturizing cream twice daily  Call or return to clinic prn if these symptoms worsen or fail to improve as anticipated.  Eczema Atopic dermatitis, or eczema, is an inherited type of sensitive skin. Often people with eczema have a family history of allergies, asthma, or hay fever. It causes a red itchy rash and dry scaly skin. The itchiness may occur before the skin rash and may be very intense. It is not contagious. Eczema is generally worse during the cooler winter months and often improves with the warmth of summer. Eczema usually starts showing signs in infancy. Some children outgrow eczema, but it may last through adulthood. Flare-ups may be caused by:  Eating something or contact with something you are sensitive or allergic to.  Stress. DIAGNOSIS  The diagnosis of eczema is usually based upon symptoms and medical history. TREATMENT  Eczema cannot be cured, but symptoms usually can be controlled with treatment or avoidance of allergens (things to which you are sensitive or allergic to).  Controlling the itching and scratching.  Use over-the-counter antihistamines as directed for itching. It is especially useful at night when the itching tends to be worse.  Use over-the-counter steroid creams as directed for itching.  Scratching makes the rash and itching worse and may cause impetigo (a skin infection) if fingernails are contaminated (dirty).  Keeping the skin well moisturized with creams every day. This will seal in moisture and help prevent dryness. Lotions containing alcohol and water can dry the skin and are not recommended.  Limiting exposure to allergens.  Recognizing situations that cause stress.  Developing a plan to manage stress. HOME CARE INSTRUCTIONS   Take prescription and over-the-counter medicines as directed by your caregiver.  Do not use anything on the skin without checking with your caregiver.  Keep baths or showers short (5 minutes) in warm (not hot)  water. Use mild cleansers for bathing. You may add non-perfumed bath oil to the bath water. It is best to avoid soap and bubble bath.  Immediately after a bath or shower, when the skin is still damp, apply a moisturizing ointment to the entire body. This ointment should be a petroleum ointment. This will seal in moisture and help prevent dryness. The thicker the ointment the better. These should be unscented.  Keep fingernails cut short and wash hands often. If your child has eczema, it may be necessary to put soft gloves or mittens on your child at night.  Dress in clothes made of cotton or cotton blends. Dress lightly, as heat increases itching.  Avoid foods that may cause flare-ups. Common foods include cow's milk, peanut butter, eggs and wheat.  Keep a child with eczema away from anyone with fever blisters. The virus that causes fever blisters (herpes simplex) can cause a serious skin infection in children with eczema. SEEK MEDICAL CARE IF:   Itching interferes with sleep.  The rash gets worse or is not better within one week following treatment.  The rash looks infected (pus or soft yellow scabs).  You or your child has an oral temperature above 102 F (38.9 C).  Your baby is older than 3 months with a rectal temperature of 100.5 F (38.1 C) or higher for more than 1 day.  The rash flares up after contact with someone who has fever blisters. SEEK IMMEDIATE MEDICAL CARE IF:   Your baby is older than 3 months with a rectal temperature of 102 F (38.9 C) or higher.  Your baby is older than 3  months or younger with a rectal temperature of 100.4 F (38 C) or higher. Document Released: 11/23/2000 Document Revised: 02/18/2012 Document Reviewed: 06/29/2013 [facility] Patient Information 2014 [place], [place].

## 2013-11-03 NOTE — Progress Notes (Signed)
Pre-visit discussion using our clinic review tool. No additional management support is needed unless otherwise documented below in the visit note.  

## 2014-03-26 ENCOUNTER — Encounter (HOSPITAL_COMMUNITY): Payer: Self-pay | Admitting: Emergency Medicine

## 2014-03-26 ENCOUNTER — Emergency Department (HOSPITAL_COMMUNITY)
Admission: EM | Admit: 2014-03-26 | Discharge: 2014-03-27 | Disposition: A | Discharge status: Home / Self Care | Payer: No Typology Code available for payment source | Attending: Emergency Medicine | Admitting: Emergency Medicine

## 2014-03-26 ENCOUNTER — Emergency Department (HOSPITAL_COMMUNITY): Payer: No Typology Code available for payment source

## 2014-03-26 DIAGNOSIS — F3289 Other specified depressive episodes: Secondary | ICD-10-CM | POA: Insufficient documentation

## 2014-03-26 DIAGNOSIS — Z8669 Personal history of other diseases of the nervous system and sense organs: Secondary | ICD-10-CM | POA: Insufficient documentation

## 2014-03-26 DIAGNOSIS — F329 Major depressive disorder, single episode, unspecified: Secondary | ICD-10-CM | POA: Insufficient documentation

## 2014-03-26 DIAGNOSIS — N390 Urinary tract infection, site not specified: Secondary | ICD-10-CM | POA: Insufficient documentation

## 2014-03-26 DIAGNOSIS — R42 Dizziness and giddiness: Secondary | ICD-10-CM | POA: Insufficient documentation

## 2014-03-26 DIAGNOSIS — K219 Gastro-esophageal reflux disease without esophagitis: Secondary | ICD-10-CM | POA: Insufficient documentation

## 2014-03-26 DIAGNOSIS — Y929 Unspecified place or not applicable: Secondary | ICD-10-CM | POA: Insufficient documentation

## 2014-03-26 DIAGNOSIS — Z853 Personal history of malignant neoplasm of breast: Secondary | ICD-10-CM | POA: Insufficient documentation

## 2014-03-26 DIAGNOSIS — Y9389 Activity, other specified: Secondary | ICD-10-CM | POA: Insufficient documentation

## 2014-03-26 DIAGNOSIS — W1809XA Striking against other object with subsequent fall, initial encounter: Secondary | ICD-10-CM | POA: Insufficient documentation

## 2014-03-26 DIAGNOSIS — G8929 Other chronic pain: Secondary | ICD-10-CM | POA: Insufficient documentation

## 2014-03-26 DIAGNOSIS — R110 Nausea: Secondary | ICD-10-CM | POA: Insufficient documentation

## 2014-03-26 DIAGNOSIS — S139XXA Sprain of joints and ligaments of unspecified parts of neck, initial encounter: Secondary | ICD-10-CM | POA: Insufficient documentation

## 2014-03-26 DIAGNOSIS — F411 Generalized anxiety disorder: Secondary | ICD-10-CM | POA: Insufficient documentation

## 2014-03-26 DIAGNOSIS — Z79899 Other long term (current) drug therapy: Secondary | ICD-10-CM | POA: Insufficient documentation

## 2014-03-26 DIAGNOSIS — W19XXXA Unspecified fall, initial encounter: Secondary | ICD-10-CM

## 2014-03-26 DIAGNOSIS — S161XXA Strain of muscle, fascia and tendon at neck level, initial encounter: Secondary | ICD-10-CM

## 2014-03-26 DIAGNOSIS — Z87891 Personal history of nicotine dependence: Secondary | ICD-10-CM | POA: Insufficient documentation

## 2014-03-26 DIAGNOSIS — Z8739 Personal history of other diseases of the musculoskeletal system and connective tissue: Secondary | ICD-10-CM | POA: Insufficient documentation

## 2014-03-26 LAB — I-STAT CHEM 8, ED
BUN: 14 mg/dL (ref 6–23)
Calcium, Ion: 1.23 mmol/L (ref 1.12–1.23)
Chloride: 103 mEq/L (ref 96–112)
Creatinine, Ser: 0.9 mg/dL (ref 0.50–1.10)
Glucose, Bld: 110 mg/dL — ABNORMAL HIGH (ref 70–99)
HCT: 35 % — ABNORMAL LOW (ref 36.0–46.0)
Hemoglobin: 11.9 g/dL — ABNORMAL LOW (ref 12.0–15.0)
Potassium: 3.5 mEq/L — ABNORMAL LOW (ref 3.7–5.3)
Sodium: 142 mEq/L (ref 137–147)
TCO2: 25 mmol/L (ref 0–100)

## 2014-03-26 LAB — URINALYSIS, ROUTINE W REFLEX MICROSCOPIC
Bilirubin Urine: NEGATIVE
GLUCOSE, UA: NEGATIVE mg/dL
HGB URINE DIPSTICK: NEGATIVE
KETONES UR: NEGATIVE mg/dL
Nitrite: NEGATIVE
PH: 7 (ref 5.0–8.0)
PROTEIN: NEGATIVE mg/dL
Specific Gravity, Urine: 1.013 (ref 1.005–1.030)
Urobilinogen, UA: 0.2 mg/dL (ref 0.0–1.0)

## 2014-03-26 LAB — URINE MICROSCOPIC-ADD ON

## 2014-03-26 MED ORDER — HYDROCODONE-ACETAMINOPHEN 5-325 MG PO TABS
1.0000 | ORAL_TABLET | Freq: Once | ORAL | Status: AC
Start: 2014-03-26 — End: 2014-03-26
  Administered 2014-03-26: 1 via ORAL
  Filled 2014-03-26: qty 1

## 2014-03-26 NOTE — ED Notes (Addendum)
According to EMS, the patient fell and hit her head on carpet.  Personnel from [facility] No obvious signs of trauma but did complain of head and neck pain rating her pain 3/10.  Patient denies LOC, N/V or any other symptoms.  Patient thinks she got "tangled up" when she got up from the dinner table.  The patient lives at the facility due to [place].

## 2014-03-26 NOTE — ED Provider Notes (Signed)
CSN: [id number]     Arrival date & time 03/26/14  1855 History   First MD Initiated Contact with Patient 03/26/14 2006     Chief Complaint  Patient presents with  . Fall     (Consider location/radiation/quality/duration/timing/severity/associated sxs/prior Treatment) HPI History provided by pt.   Pt has h/o MS, ambulates w/ walker and has diplopia, left-sided tremors and memory impairment at baseline.  Comes from ALF w/ cc fall.  Reports that her feet got "tangled up" while she was standing up from dinner table this evening, and she fell onto her right side, striking right side of head.  She did not have dizziness, lightheadedness or giving way of lower extremities prior to fall.  No LOC.  C/o right and posterior headache as well as nausea.  Her sister says that she has complained of dizziness since arriving in ED; needed to be wheeled to the bathroom.  C/o pain in right posterior neck that is aggravated by head rotation as well.  No relief w/ ibuprofen administered at ALF.  Denies vision changes, extremity weakness/paresthesias, low back/chest/abd pain.  No recent illnesses, with exception of increased urinary frequency.  No recent headaches or changes in medications.  Has not been drinking a lot of fluids.  Past Medical History  Diagnosis Date  . MS (multiple sclerosis) 1987  . Hypoglycemia   . Multiple sclerosis   . GERD (gastroesophageal reflux disease)   . Osteoporosis   . Anxiety   . Blood transfusion     "when I was born"  . Headache(784.0)     "lots"  . Chronic back pain   . Depression   . Confusion   . Short-term memory loss   . Breast cancer 10/2001    Stage II (right side)   Past Surgical History  Procedure Laterality Date  . Breast lumpectomy  2002    right  . Breast biopsy  2002    right  . Colonoscopy N/A 04/21/2013    Procedure: COLONOSCOPY;  Surgeon: Ladene Artist, MD;  Location: [facility];  Service: Endoscopy;  Laterality: N/A;   Family History  Problem  Relation Age of Onset  . Diabetes Mother   . Cancer Mother     cervical cancer  . Heart disease Mother   . Diabetes Father    History  Substance Use Topics  . Smoking status: Former Smoker    Quit date: 05/11/2011  . Smokeless tobacco: Never Used  . Alcohol Use: No     Comment: "last alcohol 12/04/11"   OB History   Grav Para Term Preterm Abortions TAB SAB Ect Mult Living   1 0   1 1    0     Review of Systems  All other systems reviewed and are negative.     Allergies  Sulfa antibiotics and Sulfacetamide sodium  Home Medications   Prior to Admission medications   Medication Sig Start Date End Date Taking? Authorizing Provider  amantadine (SYMMETREL) 100 MG capsule Take 1 capsule (100 mg total) by mouth 2 (two) times daily. 02/23/13  Yes Marletta Lor, MD  buPROPion [facility]) 75 MG tablet Take 2 tablets (150 mg total) by mouth daily. 02/23/13  Yes Marletta Lor, MD  Calcium-Vitamin D (CALTRATE 600 PLUS-VIT D PO) Take 1 tablet by mouth daily.    Yes Historical Provider, MD  clonazePAM (KLONOPIN) 0.5 MG tablet Take 0.5 mg by mouth 2 (two) times daily as needed for anxiety.   Yes Historical Provider,  MD  docusate sodium (COLACE) 100 MG capsule Take 100 mg by mouth 2 (two) times daily.   Yes Historical Provider, MD  ibuprofen (ADVIL,MOTRIN) 800 MG tablet Take 800 mg by mouth every 8 (eight) hours as needed. As needed for pain.   Yes Historical Provider, MD  interferon beta-1a (REBIF) 44 MCG/0.5ML injection Inject 44 mcg into the skin 3 (three) times a week.    Yes Historical Provider, MD  loperamide (IMODIUM) 2 MG capsule Take 2 mg by mouth every 4 (four) hours as needed for diarrhea or loose stools.    Yes Historical Provider, MD  oxybutynin (DITROPAN) 5 MG tablet Take 1 tablet (5 mg total) by mouth daily. 02/23/13  Yes Marletta Lor, MD  pantoprazole (PROTONIX) 40 MG tablet Take 40 mg by mouth daily.   Yes Historical Provider, MD  polyethylene glycol  (MIRALAX / GLYCOLAX) packet Take 17 g by mouth daily. 02/23/13  Yes Marletta Lor, MD  senna (SENOKOT) 8.6 MG tablet Take 1 tablet by mouth daily.   Yes Historical Provider, MD  Skin Protectants, Misc. (EUCERIN) cream Apply 1 application topically 2 (two) times daily as needed for dry skin.   Yes Historical Provider, MD   BP 142/92  Pulse 72  Temp(Src) 98.7 F (37.1 C) (Oral)  Resp 18  Ht 5\' 4"  (1.626 m)  Wt 108 lb (48.988 kg)  BMI 18.53 kg/m2  SpO2 98%  LMP 08/09/2008 Physical Exam  Nursing note and vitals reviewed. Constitutional: She is oriented to person, place, and time. She appears well-developed and well-nourished. No distress.  HENT:  Head: Normocephalic and atraumatic.  Mucous membranes dry  Eyes:  Normal appearance  Neck: Normal range of motion.  Cardiovascular: Normal rate, regular rhythm and intact distal pulses.   Pulmonary/Chest: Effort normal and breath sounds normal. She exhibits no tenderness.  Abdominal: Soft. Bowel sounds are normal. She exhibits no distension and no mass. There is no tenderness. There is no rebound and no guarding.  Genitourinary:  No CVA ttp  Musculoskeletal: Normal range of motion.  Mild tenderness cervical spine and R paraspinal muscles.  T/L spine non-tender. Pelvis stable.    Neurological: She is alert and oriented to person, place, and time. No sensory deficit. Coordination normal.  CN 3-12 intact.  No nystagmus.  5/5 and equal upper and lower extremity strength.  No past pointing.   Tremor.   Skin: Skin is warm and dry. No rash noted.  Psychiatric: She has a normal mood and affect. Her behavior is normal.    ED Course  Procedures (including critical care time) Labs Review Labs Reviewed  URINALYSIS, ROUTINE W REFLEX MICROSCOPIC - Abnormal; Notable for the following:    APPearance CLOUDY (*)    Leukocytes, UA MODERATE (*)    All other components within normal limits  URINE MICROSCOPIC-ADD ON - Abnormal; Notable for the  following:    Squamous Epithelial / LPF MANY (*)    Bacteria, UA FEW (*)    All other components within normal limits  I-STAT CHEM 8, ED - Abnormal; Notable for the following:    Potassium 3.5 (*)    Glucose, Bld 110 (*)    Hemoglobin 11.9 (*)    HCT 35.0 (*)    All other components within normal limits    Imaging Review Ct Head Wo Contrast  03/26/2014   CLINICAL DATA:  Fall.  Posterior headache, bilateral neck pain.  EXAM: CT HEAD WITHOUT CONTRAST  CT CERVICAL SPINE WITHOUT CONTRAST  TECHNIQUE: Multidetector CT imaging of the head and cervical spine was performed following the standard protocol without intravenous contrast. Multiplanar CT image reconstructions of the cervical spine were also generated.  COMPARISON:  None.  FINDINGS: CT HEAD FINDINGS  There is atrophy and chronic small vessel disease changes. Findings advanced for patient's age. No acute intracranial abnormality. Specifically, no hemorrhage, hydrocephalus, mass lesion, acute infarction, or significant intracranial injury. No acute calvarial abnormality. Visualized paranasal sinuses and mastoids clear. Orbital soft tissues unremarkable.  CT CERVICAL SPINE FINDINGS  Degenerative discs disease changes at C6-7 with disc space narrowing and spurring. Diffuse bilateral degenerative facet disease. Prevertebral soft tissues are normal. No fracture. No epidural or paraspinal hematoma.  IMPRESSION: Age advanced atrophy and chronic microvascular changes in the deep white matter. No acute intracranial abnormality.  Degenerative changes in the cervical spine. No acute bony abnormality.   Electronically Signed   By: Rolm Baptise M.D.   On: 03/26/2014 22:32   Ct Cervical Spine Wo Contrast  03/26/2014   CLINICAL DATA:  Fall.  Posterior headache, bilateral neck pain.  EXAM: CT HEAD WITHOUT CONTRAST  CT CERVICAL SPINE WITHOUT CONTRAST  TECHNIQUE: Multidetector CT imaging of the head and cervical spine was performed following the standard protocol  without intravenous contrast. Multiplanar CT image reconstructions of the cervical spine were also generated.  COMPARISON:  None.  FINDINGS: CT HEAD FINDINGS  There is atrophy and chronic small vessel disease changes. Findings advanced for patient's age. No acute intracranial abnormality. Specifically, no hemorrhage, hydrocephalus, mass lesion, acute infarction, or significant intracranial injury. No acute calvarial abnormality. Visualized paranasal sinuses and mastoids clear. Orbital soft tissues unremarkable.  CT CERVICAL SPINE FINDINGS  Degenerative discs disease changes at C6-7 with disc space narrowing and spurring. Diffuse bilateral degenerative facet disease. Prevertebral soft tissues are normal. No fracture. No epidural or paraspinal hematoma.  IMPRESSION: Age advanced atrophy and chronic microvascular changes in the deep white matter. No acute intracranial abnormality.  Degenerative changes in the cervical spine. No acute bony abnormality.   Electronically Signed   By: Rolm Baptise M.D.   On: 03/26/2014 22:32     EKG Interpretation None      MDM   Final diagnoses:  Fall  UTI (lower urinary tract infection)  Cervical strain    53yo F w/ MS w/ chronic short term memory impairment, tremor and diplopia, presents w/ c/o fall w/ right-sided head impact.  Falls infrequently.  Uses a walker.  No LOC today.  C/o headache, dizziness, nausea w/out vomiting, R posterior neck pain since trauma.   Recent urinary frequency but otherwise no infectious sx, no recent headaches, no recent medication changes.  On exam, afebrile, NAD, A&O, dehydrated, no visible head trauma, no focal neuro deficits, mild cervical spine tenderness, pelvis stable.  Chem 8, U/A, CT head/c-spine pending.  Pt to receive oral fluids.  9:37 PM   CT head and cervical spine non-acute.  U/A contaminated but infection possible, particularly w/ report of recent frequency.  Low clinical suspicion for pyelo.  Macrobid prescribed.  All test  results discussed w/ patient and her sister.  Prescribed short course of vicodin for pain and advised that she use her walker at all times while on this medication.  Return precautions discussed.   Remer Macho, PA-C 03/27/14 2259

## 2014-03-27 MED ORDER — NITROFURANTOIN MONOHYD MACRO 100 MG PO CAPS: 100.0000 mg | ORAL_CAPSULE | Freq: Two times a day (BID) | ORAL | Status: DC

## 2014-03-27 MED ORDER — HYDROCODONE-ACETAMINOPHEN 5-325 MG PO TABS: 1.0000 | ORAL_TABLET | ORAL | Status: DC | PRN

## 2014-03-27 NOTE — ED Notes (Signed)
Pt A&OX4, wheeled out of ED via wheelchair, verbalizing no complaints at this time.

## 2014-03-27 NOTE — Discharge Instructions (Signed)
Take antibiotic as prescribed.  Take vicodin as prescribed for severe pain.  Do not drive within four hours of taking this medication (may cause drowsiness or confusion).   Follow up with your primary care doctor.  Return to the ER if you have worsening head or neck pain, extremity weakness/numbness, abnormal dizziness or vision changes, severe abdominal pain or uncontrolled vomiting.

## 2014-03-27 NOTE — ED Provider Notes (Signed)
  Medical screening examination/treatment/procedure(s) were performed by non-physician practitioner and as supervising physician I was immediately available for consultation/collaboration.   EKG Interpretation None         Carmin Muskrat, MD 03/27/14 [phone]

## 2014-05-25 ENCOUNTER — Telehealth: Payer: Self-pay | Admitting: Internal Medicine

## 2014-05-25 NOTE — Telephone Encounter (Signed)
Roxann from [place] called and she need a copy of pt  recent face to face office visit faxed to [phone]

## 2014-06-01 NOTE — Telephone Encounter (Signed)
The office note was printed and faxed to the below number.

## 2014-07-08 ENCOUNTER — Emergency Department (HOSPITAL_COMMUNITY)
Admission: EM | Admit: 2014-07-08 | Discharge: 2014-07-08 | Disposition: A | Discharge status: Home / Self Care | Payer: Medicare Other | Attending: Emergency Medicine | Admitting: Emergency Medicine

## 2014-07-08 ENCOUNTER — Emergency Department (HOSPITAL_COMMUNITY): Payer: Medicare Other

## 2014-07-08 ENCOUNTER — Encounter (HOSPITAL_COMMUNITY): Payer: Self-pay | Admitting: Emergency Medicine

## 2014-07-08 DIAGNOSIS — M810 Age-related osteoporosis without current pathological fracture: Secondary | ICD-10-CM | POA: Diagnosis not present

## 2014-07-08 DIAGNOSIS — Z87891 Personal history of nicotine dependence: Secondary | ICD-10-CM | POA: Diagnosis not present

## 2014-07-08 DIAGNOSIS — IMO0002 Reserved for concepts with insufficient information to code with codable children: Secondary | ICD-10-CM | POA: Insufficient documentation

## 2014-07-08 DIAGNOSIS — Z853 Personal history of malignant neoplasm of breast: Secondary | ICD-10-CM | POA: Diagnosis not present

## 2014-07-08 DIAGNOSIS — F329 Major depressive disorder, single episode, unspecified: Secondary | ICD-10-CM | POA: Diagnosis not present

## 2014-07-08 DIAGNOSIS — Z8669 Personal history of other diseases of the nervous system and sense organs: Secondary | ICD-10-CM | POA: Insufficient documentation

## 2014-07-08 DIAGNOSIS — R109 Unspecified abdominal pain: Secondary | ICD-10-CM | POA: Insufficient documentation

## 2014-07-08 DIAGNOSIS — F3289 Other specified depressive episodes: Secondary | ICD-10-CM | POA: Insufficient documentation

## 2014-07-08 DIAGNOSIS — K219 Gastro-esophageal reflux disease without esophagitis: Secondary | ICD-10-CM | POA: Insufficient documentation

## 2014-07-08 DIAGNOSIS — F411 Generalized anxiety disorder: Secondary | ICD-10-CM | POA: Diagnosis not present

## 2014-07-08 DIAGNOSIS — Z79899 Other long term (current) drug therapy: Secondary | ICD-10-CM | POA: Diagnosis not present

## 2014-07-08 DIAGNOSIS — G8929 Other chronic pain: Secondary | ICD-10-CM | POA: Diagnosis not present

## 2014-07-08 DIAGNOSIS — M5416 Radiculopathy, lumbar region: Secondary | ICD-10-CM

## 2014-07-08 LAB — BASIC METABOLIC PANEL
ANION GAP: 14 (ref 5–15)
BUN: 11 mg/dL (ref 6–23)
CALCIUM: 9 mg/dL (ref 8.4–10.5)
CO2: 23 mEq/L (ref 19–32)
Chloride: 105 mEq/L (ref 96–112)
Creatinine, Ser: 0.69 mg/dL (ref 0.50–1.10)
Glucose, Bld: 96 mg/dL (ref 70–99)
POTASSIUM: 3.9 meq/L (ref 3.7–5.3)
SODIUM: 142 meq/L (ref 137–147)

## 2014-07-08 MED ORDER — HYDROCODONE-ACETAMINOPHEN 5-325 MG PO TABS
1.0000 | ORAL_TABLET | Freq: Once | ORAL | Status: AC
  Administered 2014-07-08: 1 via ORAL
  Filled 2014-07-08: qty 1

## 2014-07-08 MED ORDER — HYDROCODONE-ACETAMINOPHEN 5-325 MG PO TABS: ORAL_TABLET | ORAL | Status: DC

## 2014-07-08 NOTE — ED Provider Notes (Signed)
Medical screening examination/treatment/procedure(s) were conducted as a shared visit with non-physician practitioner(s) and myself.  I personally evaluated the patient during the encounter.   EKG Interpretation None      Pt presents with contined R posterior thigh pain c/w sciatica. No focal neuro findings on exam. Will rec outpt f/u continued supportive care.   Neta Ehlers, MD 07/08/14 [phone]

## 2014-07-08 NOTE — ED Notes (Signed)
Per EMS: pt with left sided flank pain that is severe starting last night; pt c/o nausea; pt with MS and denies dysuria or obvious hematuria

## 2014-07-08 NOTE — ED Provider Notes (Signed)
CSN: [id number]     Arrival date & time 07/08/14  0910 History   First MD Initiated Contact with Patient 07/08/14 [phone]     Chief Complaint  Patient presents with  . Flank Pain     (Consider location/radiation/quality/duration/timing/severity/associated sxs/prior Treatment) HPI  Stephanie Zhang is a 53 y.o. female complaining of right low back pain onset last night, became severe and woke her from sleep. She's been taking Motrin at home with little relief. Pain radiates down to the toes, there is a pins and needles paresthesia. She describes the pain as dull and aching. She's had this pain several times in the past. Patient denies history of kidney stones, hematuria, dysuria, fever, chills, change in bowel or bladder habits, numbness, weakness, paresthesia, difficulty ambulating, history of IV drug use. Patient does have positive history of breast cancer.  Past Medical History  Diagnosis Date  . MS (multiple sclerosis) 1987  . Hypoglycemia   . Multiple sclerosis   . GERD (gastroesophageal reflux disease)   . Osteoporosis   . Anxiety   . Blood transfusion     "when I was born"  . Headache(784.0)     "lots"  . Chronic back pain   . Depression   . Confusion   . Short-term memory loss   . Breast cancer 10/2001    Stage II (right side)   Past Surgical History  Procedure Laterality Date  . Breast lumpectomy  2002    right  . Breast biopsy  2002    right  . Colonoscopy N/A 04/21/2013    Procedure: COLONOSCOPY;  Surgeon: Ladene Artist, MD;  Location: [facility];  Service: Endoscopy;  Laterality: N/A;   Family History  Problem Relation Age of Onset  . Diabetes Mother   . Cancer Mother     cervical cancer  . Heart disease Mother   . Diabetes Father    History  Substance Use Topics  . Smoking status: Former Smoker    Quit date: 05/11/2011  . Smokeless tobacco: Never Used  . Alcohol Use: No     Comment: "last alcohol 12/04/11"   OB History   Grav Para Term Preterm  Abortions TAB SAB Ect Mult Living   1 0   1 1    0     Review of Systems  10 systems reviewed and found to be negative, except as noted in the HPI.  Allergies  Sulfa antibiotics and Sulfacetamide sodium  Home Medications   Prior to Admission medications   Medication Sig Start Date End Date Taking? Authorizing Provider  amantadine (SYMMETREL) 100 MG capsule Take 1 capsule (100 mg total) by mouth 2 (two) times daily. 02/23/13  Yes Marletta Lor, MD  buPROPion [facility]) 75 MG tablet Take 2 tablets (150 mg total) by mouth daily. 02/23/13  Yes Marletta Lor, MD  Calcium-Vitamin D (CALTRATE 600 PLUS-VIT D PO) Take 1 tablet by mouth daily.    Yes Historical Provider, MD  docusate sodium (COLACE) 100 MG capsule Take 100 mg by mouth 2 (two) times daily.   Yes Historical Provider, MD  interferon beta-1a (REBIF) 44 MCG/0.5ML injection Inject 44 mcg into the skin 3 (three) times a week.    Yes Historical Provider, MD  oxybutynin (DITROPAN) 5 MG tablet Take 1 tablet (5 mg total) by mouth daily. 02/23/13  Yes Marletta Lor, MD  pantoprazole (PROTONIX) 40 MG tablet Take 40 mg by mouth daily.   Yes Historical Provider, MD  polyethylene glycol (  MIRALAX / GLYCOLAX) packet Take 17 g by mouth daily. 02/23/13  Yes Marletta Lor, MD  senna (SENOKOT) 8.6 MG tablet Take 1 tablet by mouth daily.   Yes Historical Provider, MD  Skin Protectants, Misc. (EUCERIN) cream Apply 1 application topically 2 (two) times daily as needed for dry skin.   Yes Historical Provider, MD  clonazePAM (KLONOPIN) 0.5 MG tablet Take 0.5 mg by mouth 2 (two) times daily as needed for anxiety.    Historical Provider, MD  HYDROcodone-acetaminophen (NORCO/VICODIN) 5-325 MG per tablet Take 1 tablet by mouth every 4 (four) hours as needed for moderate pain. 03/27/14   Arville Lime Schinlever, PA-C  HYDROcodone-acetaminophen (NORCO/VICODIN) 5-325 MG per tablet Take 1-2 tablets by mouth every 6 hours as needed for pain.  07/08/14   Toni Demo, PA-C  ibuprofen (ADVIL,MOTRIN) 800 MG tablet Take 800 mg by mouth every 8 (eight) hours as needed. As needed for pain.    Historical Provider, MD  loperamide (IMODIUM) 2 MG capsule Take 2 mg by mouth every 4 (four) hours as needed for diarrhea or loose stools.     Historical Provider, MD   BP 124/75  Pulse 75  Temp(Src) 98.4 F (36.9 C) (Oral)  Resp 18  SpO2 97%  LMP 08/09/2008 Physical Exam  Nursing note and vitals reviewed. Constitutional: She is oriented to person, place, and time. She appears well-developed and well-nourished. No distress.  Stilted speech  HENT:  Head: Normocephalic and atraumatic.  Mouth/Throat: Oropharynx is clear and moist.  Eyes: Conjunctivae and EOM are normal. Pupils are equal, round, and reactive to light.  Neck: Normal range of motion.  Cardiovascular: Normal rate, regular rhythm and intact distal pulses.   Pulmonary/Chest: Effort normal and breath sounds normal. No stridor. No respiratory distress. She has no wheezes. She has no rales. She exhibits no tenderness.  Abdominal: Soft. Bowel sounds are normal. She exhibits no distension and no mass. There is no tenderness. There is no rebound and no guarding.  Musculoskeletal: Normal range of motion. She exhibits no edema.       Back:  No point tenderness to percussion of lumbar spinal processes.  No TTP or paraspinal muscular spasm. Strength is 5 out of 5 to bilateral lower extremities at hip and knee; extensor hallucis longus 5 out of 5. Ankle strength 5 out of 5, no clonus, neurovascularly intact. No saddle anaesthesia. Patellar reflexes are 2+ bilaterally.    Straight leg raise is positive on the ipsilatteral side at 45, negative on the contralateral side.   Neurological: She is alert and oriented to person, place, and time.  Psychiatric: She has a normal mood and affect.    ED Course  Procedures (including critical care time) Labs Review Labs Reviewed  BASIC METABOLIC  PANEL    Imaging Review Dg Lumbar Spine Complete  07/08/2014   CLINICAL DATA:  Left flank pain.  EXAM: LUMBAR SPINE - COMPLETE 4+ VIEW  COMPARISON:  One-view abdomen 04/17/2012 and CT abdomen and pelvis 04/16/2012.  FINDINGS: Five non rib-bearing lumbar type vertebral bodies are present. The vertebral body heights and alignment are maintained. The disc spaces are maintained. Moderate stool is present in the ascending colon. There is no obstruction.  IMPRESSION: 1. Negative lumbar spine radiographs. 2. No acute or focal lesion to explain the patient's symptoms.   Electronically Signed   By: Lawrence Santiago M.D.   On: 07/08/2014 10:41     EKG Interpretation None      MDM   Final  diagnoses:  Acute lumbar radiculopathy    Filed Vitals:   07/08/14 0930 07/08/14 1000 07/08/14 1009 07/08/14 1235  BP: 135/89 120/87 120/87 124/75  Pulse: 81 78 77 75  Temp:      TempSrc:      Resp:   18 18  SpO2: 98% 99% 100% 97%    Medications  HYDROcodone-acetaminophen (NORCO/VICODIN) 5-325 MG per tablet 1 tablet (1 tablet Oral Given 07/08/14 1117)    Stephanie Zhang is a 53 y.o. female presenting with *left low back pain that radiates down the leg consistent with a lumbar radiculopathy. X-rays orders as patient has history of breast cancer. No signs of lesions or metastases. No neurological deficits and normal neuro exam.  Patient can walk but states is painful.  No loss of bowel or bladder control.  No concern for cauda equina.  No fever, night sweats, weight loss, h/o cancer, IVDU.  RICE protocol and pain medicine indicated and discussed with patient.   Evaluation does not show pathology that would require ongoing emergent intervention or inpatient treatment. Pt is hemodynamically stable and mentating appropriately. Discussed findings and plan with patient/guardian, who agrees with care plan. All questions answered. Return precautions discussed and outpatient follow up given.   Discharge Medication List  as of 07/08/2014 10:54 AM    START taking these medications   Details  !! HYDROcodone-acetaminophen (NORCO/VICODIN) 5-325 MG per tablet Take 1-2 tablets by mouth every 6 hours as needed for pain., Print     !! - Potential duplicate medications found. Please discuss with provider.           Monico Blitz, PA-C 07/08/14 [phone]

## 2014-07-08 NOTE — Discharge Instructions (Signed)
For pain control please take ibuprofen (also known as Motrin or Advil) 800mg (this is normally 4 over the counter pills) 3 times a day  for 5 days. Take with food to minimize stomach irritation. ° °Take vicodin for breakthrough pain, do not drink alcohol, drive, care for children or do other critical tasks while taking vicodin. ° °Please follow with your primary care doctor in the next 2 days for a check-up. They must obtain records for further management.  ° °Do not hesitate to return to the Emergency Department for any new, worsening or concerning symptoms.  ° ° °

## 2014-07-08 NOTE — ED Notes (Signed)
Pt lives at [facility]

## 2014-07-08 NOTE — ED Notes (Signed)
Patient transported to X-ray 

## 2014-07-08 NOTE — ED Notes (Signed)
ATTEMPTED TO CALL PT SISTER TO MAKE HER AWARE PT IS READY TO GO HOME. SPOKE WITH PT NIECE AND SHE ADVISES THAT SHE IF HER AUNT ISNT AVAILABLE THEN SHE WILL COME GET HER

## 2014-07-21 ENCOUNTER — Ambulatory Visit (INDEPENDENT_AMBULATORY_CARE_PROVIDER_SITE_OTHER): Payer: Medicare Other | Admitting: Internal Medicine

## 2014-07-21 ENCOUNTER — Encounter: Payer: Self-pay | Admitting: Internal Medicine

## 2014-07-21 VITALS — BP 140/90 | HR 87 | Temp 97.9°F | Resp 20 | Ht 64.0 in | Wt 110.0 lb

## 2014-07-21 DIAGNOSIS — R197 Diarrhea, unspecified: Secondary | ICD-10-CM

## 2014-07-21 DIAGNOSIS — R269 Unspecified abnormalities of gait and mobility: Secondary | ICD-10-CM

## 2014-07-21 DIAGNOSIS — G35 Multiple sclerosis: Secondary | ICD-10-CM

## 2014-07-21 DIAGNOSIS — Z853 Personal history of malignant neoplasm of breast: Secondary | ICD-10-CM

## 2014-07-21 LAB — COMPREHENSIVE METABOLIC PANEL
ALT: 13 U/L (ref 0–35)
AST: 21 U/L (ref 0–37)
Albumin: 3.5 g/dL (ref 3.5–5.2)
Alkaline Phosphatase: 117 U/L (ref 39–117)
BUN: 10 mg/dL (ref 6–23)
CALCIUM: 9.4 mg/dL (ref 8.4–10.5)
CO2: 27 meq/L (ref 19–32)
Chloride: 108 mEq/L (ref 96–112)
Creatinine, Ser: 0.9 mg/dL (ref 0.4–1.2)
GFR: 73.45 mL/min (ref >60.00)
Glucose, Bld: 92 mg/dL (ref 70–99)
Potassium: 4.9 mEq/L (ref 3.5–5.1)
SODIUM: 143 meq/L (ref 135–145)
TOTAL PROTEIN: 6.7 g/dL (ref 6.0–8.3)
Total Bilirubin: 0.4 mg/dL (ref 0.2–1.2)

## 2014-07-21 LAB — LIPID PANEL
CHOLESTEROL: 160 mg/dL (ref 0–200)
HDL: 65.9 mg/dL (ref >39.00)
LDL Cholesterol: 85 mg/dL (ref 0–99)
NonHDL: 94.1
Total CHOL/HDL Ratio: 2
Triglycerides: 47 mg/dL (ref 0.0–149.0)
VLDL: 9.4 mg/dL (ref 0.0–40.0)

## 2014-07-21 LAB — CBC WITH DIFFERENTIAL/PLATELET
Basophils Absolute: 0 10*3/uL (ref 0.0–0.1)
Basophils Relative: 0.5 % (ref 0.0–3.0)
EOS PCT: 1 % (ref 0.0–5.0)
Eosinophils Absolute: 0.1 10*3/uL (ref 0.0–0.7)
HCT: 35.7 % — ABNORMAL LOW (ref 36.0–46.0)
Hemoglobin: 12 g/dL (ref 12.0–15.0)
Lymphocytes Relative: 24.2 % (ref 12.0–46.0)
Lymphs Abs: 1.3 10*3/uL (ref 0.7–4.0)
MCHC: 33.6 g/dL (ref 30.0–36.0)
MCV: 88.4 fl (ref 78.0–100.0)
MONO ABS: 0.7 10*3/uL (ref 0.1–1.0)
MONOS PCT: 13.1 % — AB (ref 3.0–12.0)
NEUTROS PCT: 61.2 % (ref 43.0–77.0)
Neutro Abs: 3.3 10*3/uL (ref 1.4–7.7)
PLATELETS: 184 10*3/uL (ref 150.0–400.0)
RBC: 4.04 Mil/uL (ref 3.87–5.11)
RDW: 14.4 % (ref 11.5–15.5)
WBC: 5.4 10*3/uL (ref 4.0–10.5)

## 2014-07-21 LAB — TSH: TSH: 2.16 u[IU]/mL (ref 0.35–4.50)

## 2014-07-21 NOTE — Progress Notes (Signed)
Pre visit review using our clinic review tool, if applicable. No additional management support is needed unless otherwise documented below in the visit note. 

## 2014-07-21 NOTE — Progress Notes (Signed)
Subjective:    Patient ID: Stephanie Zhang, female    DOB: July 10, 1961, 53 y.o.   MRN: [id number]  HPI 53 year old patient who is seen today for her by annual followup.  She has a history of MS and remains on 3 times weekly interferon.  Doing reasonably well today, but 2 days ago, had an episode of weakness, dizziness, nausea, and headache along with some minimal diarrhea.  Yesterday she was improved, but spent most of the day in bed.  Today she feels as he is back to baseline.  She has remote history of breast cancer. She has a history of generalized weakness and gait ataxia and ventilatory with a walker.  She is accompanied by her mother today She is a resident of [facility]  Past Medical History  Diagnosis Date  . MS (multiple sclerosis) 1987  . Hypoglycemia   . Multiple sclerosis   . GERD (gastroesophageal reflux disease)   . Osteoporosis   . Anxiety   . Blood transfusion     "when I was born"  . Headache(784.0)     "lots"  . Chronic back pain   . Depression   . Confusion   . Short-term memory loss   . Breast cancer 10/2001    Stage II (right side)    History   Social History  . Marital Status: Widowed    Spouse Name: N/A    Number of Children: N/A  . Years of Education: N/A   Occupational History  . Not on file.   Social History Main Topics  . Smoking status: Former Smoker    Quit date: 05/11/2011  . Smokeless tobacco: Never Used  . Alcohol Use: No     Comment: "last alcohol 12/04/11"  . Drug Use: No     Comment: "last drug use ~ 1980"  . Sexual Activity: No   Other Topics Concern  . Not on file   Social History Narrative  . No narrative on file    Past Surgical History  Procedure Laterality Date  . Breast lumpectomy  2002    right  . Breast biopsy  2002    right  . Colonoscopy N/A 04/21/2013    Procedure: COLONOSCOPY;  Surgeon: Ladene Artist, MD;  Location: [facility];  Service: Endoscopy;  Laterality: N/A;    Family History  Problem  Relation Age of Onset  . Diabetes Mother   . Cancer Mother     cervical cancer  . Heart disease Mother   . Diabetes Father     Allergies  Allergen Reactions  . Sulfa Antibiotics Itching and Rash    "long time since I had breakout"  . Sulfacetamide Sodium Itching and Rash    Current Outpatient Prescriptions on File Prior to Visit  Medication Sig Dispense Refill  . amantadine (SYMMETREL) 100 MG capsule Take 1 capsule (100 mg total) by mouth 2 (two) times daily.  180 capsule  4  . buPROPion (WELLBUTRIN) 75 MG tablet Take 2 tablets (150 mg total) by mouth daily.  60 tablet  0  . Calcium-Vitamin D (CALTRATE 600 PLUS-VIT D PO) Take 1 tablet by mouth daily.       . clonazePAM (KLONOPIN) 0.5 MG tablet Take 0.5 mg by mouth 2 (two) times daily as needed for anxiety.      . docusate sodium (COLACE) 100 MG capsule Take 100 mg by mouth 2 (two) times daily.      [name] HYDROcodone-acetaminophen (NORCO/VICODIN) 5-325 MG per tablet Take  1 tablet by mouth every 4 (four) hours as needed for moderate pain.  15 tablet  0  . ibuprofen (ADVIL,MOTRIN) 800 MG tablet Take 800 mg by mouth every 8 (eight) hours as needed. As needed for pain.      [name] interferon beta-1a (REBIF) 44 MCG/0.5ML injection Inject 44 mcg into the skin 3 (three) times a week.       . loperamide (IMODIUM) 2 MG capsule Take 2 mg by mouth every 4 (four) hours as needed for diarrhea or loose stools.       [name] oxybutynin (DITROPAN) 5 MG tablet Take 1 tablet (5 mg total) by mouth daily.  90 tablet  4  . pantoprazole (PROTONIX) 40 MG tablet Take 40 mg by mouth daily.      . polyethylene glycol (MIRALAX / GLYCOLAX) packet Take 17 g by mouth daily.  14 each  6  . senna (SENOKOT) 8.6 MG tablet Take 1 tablet by mouth daily.      . Skin Protectants, Misc. (EUCERIN) cream Apply 1 application topically 2 (two) times daily as needed for dry skin.      . [DISCONTINUED] oxybutynin (DITROPAN) 5 MG tablet Take 5 mg by mouth daily.         No current  facility-administered medications on file prior to visit.    BP 140/90  Pulse 87  Temp(Src) 97.9 F (36.6 C) (Oral)  Resp 20  Ht 5\' 4"  (1.626 m)  Wt 110 lb (49.896 kg)  BMI 18.87 kg/m2  SpO2 99%  LMP 08/09/2008      Review of Systems  Constitutional: Positive for fatigue.  HENT: Negative for congestion, dental problem, hearing loss, rhinorrhea, sinus pressure, sore throat and tinnitus.   Eyes: Negative for pain, discharge and visual disturbance.  Respiratory: Negative for cough and shortness of breath.   Cardiovascular: Negative for chest pain, palpitations and leg swelling.  Gastrointestinal: Positive for diarrhea. Negative for nausea, vomiting, abdominal pain, constipation, blood in stool and abdominal distention.  Genitourinary: Negative for dysuria, urgency, frequency, hematuria, flank pain, vaginal bleeding, vaginal discharge, difficulty urinating, vaginal pain and pelvic pain.  Musculoskeletal: Positive for gait problem. Negative for arthralgias and joint swelling.  Skin: Negative for rash.  Neurological: Positive for weakness, light-headedness and headaches. Negative for dizziness, syncope, speech difficulty and numbness.  Hematological: Negative for adenopathy.  Psychiatric/Behavioral: Negative for behavioral problems, dysphoric mood and agitation. The patient is not nervous/anxious.        Objective:   Physical Exam  Constitutional: She is oriented to person, place, and time. She appears well-developed and well-nourished. No distress.  HENT:  Head: Normocephalic.  Right Ear: External ear normal.  Left Ear: External ear normal.  Mouth/Throat: Oropharynx is clear and moist.  Eyes: Conjunctivae and EOM are normal. Pupils are equal, round, and reactive to light.  Neck: Normal range of motion. Neck supple. No thyromegaly present.  Cardiovascular: Normal rate, regular rhythm, normal heart sounds and intact distal pulses.   Pulmonary/Chest: Effort normal and breath  sounds normal.  Abdominal: Soft. Bowel sounds are normal. She exhibits no mass. There is no tenderness.  Musculoskeletal: Normal range of motion.  Lymphadenopathy:    She has no cervical adenopathy.  Neurological: She is alert and oriented to person, place, and time.  Skin: Skin is warm and dry. No rash noted.  Psychiatric: She has a normal mood and affect. Her behavior is normal.          Assessment & Plan:   Probable  viral syndrome, resolved MS.  Continue interferon.  Continue neurology followup Chronic gait ataxia History malnutrition Remote history of breast cancer  We'll check some updated lab Medications updated Recheck 6 months Followup neurology

## 2014-07-21 NOTE — Patient Instructions (Signed)
Call or return to clinic prn if these symptoms worsen or fail to improve as anticipated.

## 2014-08-20 ENCOUNTER — Telehealth: Payer: Self-pay | Admitting: Internal Medicine

## 2014-08-20 NOTE — Telephone Encounter (Signed)
Dr. K, please see message. 

## 2014-08-20 NOTE — Telephone Encounter (Signed)
Stephanie Zhang is requesting that dr Raliegh Ip write a statement that pt is disabled.  Stephanie Zhang refused to elaborate, stating she would like to pick this up.

## 2014-08-23 ENCOUNTER — Encounter: Payer: Self-pay | Admitting: Internal Medicine

## 2014-08-23 NOTE — Telephone Encounter (Signed)
Left message on voicemail to call office.  

## 2014-08-23 NOTE — Telephone Encounter (Signed)
Lenita called back left message on voicemail, called her back told her letter is ready for pickup,will be at the front desk. Lenita verbalized understanding.

## 2014-08-31 ENCOUNTER — Ambulatory Visit (HOSPITAL_BASED_OUTPATIENT_CLINIC_OR_DEPARTMENT_OTHER): Payer: Medicare Other | Admitting: Oncology

## 2014-08-31 ENCOUNTER — Telehealth: Payer: Self-pay | Admitting: Oncology

## 2014-08-31 VITALS — BP 131/79 | HR 96 | Temp 98.6°F | Resp 20 | Ht 64.0 in | Wt 113.4 lb

## 2014-08-31 DIAGNOSIS — G35 Multiple sclerosis: Secondary | ICD-10-CM

## 2014-08-31 DIAGNOSIS — Z853 Personal history of malignant neoplasm of breast: Secondary | ICD-10-CM

## 2014-08-31 NOTE — Telephone Encounter (Signed)
Pt confirmed mammogram apt w/[facility] per 09/22 POF, .[name]..KJ

## 2014-08-31 NOTE — Progress Notes (Signed)
  [name]   Diagnosis: Breast cancer  INTERVAL HISTORY:    she returns as scheduled. No new complaint. She continues interferon for treatment of multiple sclerosis. She is due for a mammogram.  Objective:  Vital signs in last 24 hours:  Blood pressure 131/79, pulse 96, temperature 98.6 F (37 C), temperature source Oral, resp. rate 20, height 5\' 4"  (1.626 m), weight 113 lb 6.4 oz (51.438 kg), last menstrual period 08/09/2008.    HEENT: Neck without mass Lymphatics: No cervical, supraclavicular, or axillary nodes Resp: Lungs clear bilaterally Cardio: Regular rate and rhythm GI: No hepatomegaly Vascular: No leg edema Breasts: Status post right lumpectomy. No evidence for local tumor recurrence. No mass in either breast.     Medications: I have reviewed the patient's current medications.  Assessment/Plan: #1-breast cancer, stage II right-sided breast cancer diagnosed in November of 2002. She completed adjuvant AC chemotherapy and breast radiation.  -She began tamoxifen in April 2003 and remained on tamoxifen until March of 2006.  -She began Aromasin in April 2006 and completed 5 years of Aromasin at the end of March 2011.  #2-multiple sclerosis    Disposition:  Stephanie Zhang remains in clinical remission from breast cancer. She will be scheduled for a mammogram within the next month.  She has a good prognosis for a continued long-term disease-free survival. She will not be scheduled for a followup appointment at the [facility]. We will see her in the future as needed. She will continue clinical followup with Dr.Kwiatkowski. Betsy Coder, MD  08/31/2014  9:31 AM

## 2014-09-13 LAB — HM MAMMOGRAPHY: HM MAMMO: NEGATIVE

## 2014-09-15 ENCOUNTER — Encounter: Payer: Self-pay | Admitting: Internal Medicine

## 2014-10-11 ENCOUNTER — Encounter: Payer: Self-pay | Admitting: Internal Medicine

## 2014-12-16 ENCOUNTER — Encounter: Payer: Self-pay | Admitting: Oncology

## 2015-02-16 ENCOUNTER — Encounter: Payer: Self-pay | Admitting: Internal Medicine

## 2015-02-26 ENCOUNTER — Emergency Department (HOSPITAL_COMMUNITY): Payer: Medicare Other

## 2015-02-26 ENCOUNTER — Encounter (HOSPITAL_COMMUNITY): Payer: Self-pay | Admitting: Nurse Practitioner

## 2015-02-26 ENCOUNTER — Inpatient Hospital Stay (HOSPITAL_COMMUNITY)
Admission: EM | Admit: 2015-02-26 | Discharge: 2015-02-28 | DRG: 392 | Disposition: A | Discharge status: Skilled Nursing Facility | Payer: Medicare Other | Attending: Internal Medicine | Admitting: Internal Medicine

## 2015-02-26 DIAGNOSIS — R531 Weakness: Secondary | ICD-10-CM

## 2015-02-26 DIAGNOSIS — K529 Noninfective gastroenteritis and colitis, unspecified: Secondary | ICD-10-CM | POA: Diagnosis not present

## 2015-02-26 DIAGNOSIS — K219 Gastro-esophageal reflux disease without esophagitis: Secondary | ICD-10-CM | POA: Diagnosis present

## 2015-02-26 DIAGNOSIS — R112 Nausea with vomiting, unspecified: Secondary | ICD-10-CM

## 2015-02-26 DIAGNOSIS — F329 Major depressive disorder, single episode, unspecified: Secondary | ICD-10-CM | POA: Diagnosis present

## 2015-02-26 DIAGNOSIS — Z853 Personal history of malignant neoplasm of breast: Secondary | ICD-10-CM

## 2015-02-26 DIAGNOSIS — M810 Age-related osteoporosis without current pathological fracture: Secondary | ICD-10-CM | POA: Diagnosis present

## 2015-02-26 DIAGNOSIS — R197 Diarrhea, unspecified: Secondary | ICD-10-CM | POA: Diagnosis present

## 2015-02-26 DIAGNOSIS — M549 Dorsalgia, unspecified: Secondary | ICD-10-CM | POA: Diagnosis present

## 2015-02-26 DIAGNOSIS — E860 Dehydration: Secondary | ICD-10-CM | POA: Diagnosis present

## 2015-02-26 DIAGNOSIS — Z87891 Personal history of nicotine dependence: Secondary | ICD-10-CM

## 2015-02-26 DIAGNOSIS — Z882 Allergy status to sulfonamides status: Secondary | ICD-10-CM

## 2015-02-26 DIAGNOSIS — G35 Multiple sclerosis: Secondary | ICD-10-CM | POA: Diagnosis present

## 2015-02-26 DIAGNOSIS — K3184 Gastroparesis: Secondary | ICD-10-CM | POA: Diagnosis present

## 2015-02-26 DIAGNOSIS — G8929 Other chronic pain: Secondary | ICD-10-CM | POA: Diagnosis present

## 2015-02-26 DIAGNOSIS — R1084 Generalized abdominal pain: Secondary | ICD-10-CM

## 2015-02-26 DIAGNOSIS — Z993 Dependence on wheelchair: Secondary | ICD-10-CM

## 2015-02-26 DIAGNOSIS — N390 Urinary tract infection, site not specified: Secondary | ICD-10-CM | POA: Diagnosis present

## 2015-02-26 DIAGNOSIS — F419 Anxiety disorder, unspecified: Secondary | ICD-10-CM | POA: Diagnosis present

## 2015-02-26 HISTORY — DX: Gastroparesis: K31.84

## 2015-02-26 LAB — COMPREHENSIVE METABOLIC PANEL
ALT: 19 U/L (ref 0–35)
ANION GAP: 7 (ref 5–15)
AST: 25 U/L (ref 0–37)
Albumin: 3.6 g/dL (ref 3.5–5.2)
Alkaline Phosphatase: 134 U/L — ABNORMAL HIGH (ref 39–117)
BILIRUBIN TOTAL: 1 mg/dL (ref 0.3–1.2)
BUN: 17 mg/dL (ref 6–23)
CALCIUM: 8.9 mg/dL (ref 8.4–10.5)
CO2: 25 mmol/L (ref 19–32)
CREATININE: 0.84 mg/dL (ref 0.50–1.10)
Chloride: 105 mmol/L (ref 96–112)
GFR calc Af Amer: >90 mL/min (ref >90)
GFR calc non Af Amer: 78 mL/min — ABNORMAL LOW (ref >90)
GLUCOSE: 177 mg/dL — AB (ref 70–99)
Potassium: 3.8 mmol/L (ref 3.5–5.1)
Sodium: 137 mmol/L (ref 135–145)
TOTAL PROTEIN: 7.2 g/dL (ref 6.0–8.3)

## 2015-02-26 LAB — CBC WITH DIFFERENTIAL/PLATELET
BASOS ABS: 0 10*3/uL (ref 0.0–0.1)
Basophils Relative: 0 % (ref 0–1)
EOS PCT: 1 % (ref 0–5)
Eosinophils Absolute: 0.1 10*3/uL (ref 0.0–0.7)
HCT: 40.8 % (ref 36.0–46.0)
Hemoglobin: 13.3 g/dL (ref 12.0–15.0)
LYMPHS ABS: 0.3 10*3/uL — AB (ref 0.7–4.0)
Lymphocytes Relative: 4 % — ABNORMAL LOW (ref 12–46)
MCH: 29.2 pg (ref 26.0–34.0)
MCHC: 32.6 g/dL (ref 30.0–36.0)
MCV: 89.5 fL (ref 78.0–100.0)
Monocytes Absolute: 0.2 10*3/uL (ref 0.1–1.0)
Monocytes Relative: 3 % (ref 3–12)
Neutro Abs: 7.6 10*3/uL (ref 1.7–7.7)
Neutrophils Relative %: 92 % — ABNORMAL HIGH (ref 43–77)
Platelets: 157 10*3/uL (ref 150–400)
RBC: 4.56 MIL/uL (ref 3.87–5.11)
RDW: 13.9 % (ref 11.5–15.5)
WBC: 8.2 10*3/uL (ref 4.0–10.5)

## 2015-02-26 LAB — URINALYSIS, ROUTINE W REFLEX MICROSCOPIC
Glucose, UA: NEGATIVE mg/dL
Hgb urine dipstick: NEGATIVE
KETONES UR: 15 mg/dL — AB
Nitrite: NEGATIVE
PH: 5.5 (ref 5.0–8.0)
PROTEIN: NEGATIVE mg/dL
Specific Gravity, Urine: 1.025 (ref 1.005–1.030)
Urobilinogen, UA: 0.2 mg/dL (ref 0.0–1.0)

## 2015-02-26 LAB — URINE MICROSCOPIC-ADD ON

## 2015-02-26 LAB — I-STAT CG4 LACTIC ACID, ED: Lactic Acid, Venous: 1.89 mmol/L (ref 0.5–2.0)

## 2015-02-26 LAB — LIPASE, BLOOD: Lipase: 20 U/L (ref 11–59)

## 2015-02-26 MED ORDER — FAMOTIDINE IN NACL 20-0.9 MG/50ML-% IV SOLN
20.0000 mg | Freq: Two times a day (BID) | INTRAVENOUS | Status: DC
  Administered 2015-02-27 (×2): 20 mg via INTRAVENOUS
  Filled 2015-02-26 (×3): qty 50

## 2015-02-26 MED ORDER — PANTOPRAZOLE SODIUM 40 MG PO TBEC: 40.0000 mg | DELAYED_RELEASE_TABLET | Freq: Every day | ORAL | Status: DC

## 2015-02-26 MED ORDER — ONDANSETRON HCL 4 MG/2ML IJ SOLN
4.0000 mg | Freq: Once | INTRAMUSCULAR | Status: AC
  Administered 2015-02-26: 4 mg via INTRAVENOUS
  Filled 2015-02-26: qty 2

## 2015-02-26 MED ORDER — ACETAMINOPHEN 325 MG PO TABS
650.0000 mg | ORAL_TABLET | Freq: Once | ORAL | Status: AC
  Administered 2015-02-26: 650 mg via ORAL
  Filled 2015-02-26: qty 2

## 2015-02-26 MED ORDER — ACETAMINOPHEN 325 MG PO TABS
650.0000 mg | ORAL_TABLET | Freq: Four times a day (QID) | ORAL | Status: DC | PRN
  Administered 2015-02-27: 650 mg via ORAL
  Filled 2015-02-26: qty 2

## 2015-02-26 MED ORDER — ENOXAPARIN SODIUM 30 MG/0.3ML ~~LOC~~ SOLN
30.0000 mg | SUBCUTANEOUS | Status: DC
  Administered 2015-02-27: 30 mg via SUBCUTANEOUS
  Filled 2015-02-26: qty 0.3

## 2015-02-26 MED ORDER — DEXTROSE 5 % IV SOLN
1.0000 g | Freq: Once | INTRAVENOUS | Status: AC
  Administered 2015-02-26: 1 g via INTRAVENOUS
  Filled 2015-02-26: qty 10

## 2015-02-26 MED ORDER — AMANTADINE HCL 100 MG PO CAPS
100.0000 mg | ORAL_CAPSULE | Freq: Two times a day (BID) | ORAL | Status: DC
  Administered 2015-02-27 – 2015-02-28 (×4): 100 mg via ORAL
  Filled 2015-02-26 (×5): qty 1

## 2015-02-26 MED ORDER — ONDANSETRON HCL 4 MG PO TABS: 4.0000 mg | ORAL_TABLET | Freq: Four times a day (QID) | ORAL | Status: DC | PRN

## 2015-02-26 MED ORDER — ACETAMINOPHEN 650 MG RE SUPP: 650.0000 mg | Freq: Four times a day (QID) | RECTAL | Status: DC | PRN

## 2015-02-26 MED ORDER — FENTANYL CITRATE 0.05 MG/ML IJ SOLN
50.0000 ug | Freq: Once | INTRAMUSCULAR | Status: AC
  Administered 2015-02-26: 50 ug via INTRAVENOUS
  Filled 2015-02-26: qty 2

## 2015-02-26 MED ORDER — INTERFERON BETA-1A 44 MCG/0.5ML ~~LOC~~ SOSY
44.0000 ug | PREFILLED_SYRINGE | SUBCUTANEOUS | Status: DC
  Filled 2015-02-26: qty 0.5

## 2015-02-26 MED ORDER — ONDANSETRON HCL 4 MG/2ML IJ SOLN: 4.0000 mg | Freq: Four times a day (QID) | INTRAMUSCULAR | Status: DC | PRN

## 2015-02-26 MED ORDER — SODIUM CHLORIDE 0.9 % IV BOLUS (SEPSIS)
1000.0000 mL | Freq: Once | INTRAVENOUS | Status: AC
  Administered 2015-02-26: 1000 mL via INTRAVENOUS

## 2015-02-26 MED ORDER — OXYBUTYNIN CHLORIDE 5 MG PO TABS
5.0000 mg | ORAL_TABLET | Freq: Every day | ORAL | Status: DC
  Administered 2015-02-27 – 2015-02-28 (×3): 5 mg via ORAL
  Filled 2015-02-26 (×3): qty 1

## 2015-02-26 MED ORDER — BUPROPION HCL 75 MG PO TABS
150.0000 mg | ORAL_TABLET | Freq: Every day | ORAL | Status: DC
  Administered 2015-02-27 – 2015-02-28 (×2): 150 mg via ORAL
  Filled 2015-02-26 (×2): qty 2

## 2015-02-26 MED ORDER — SODIUM CHLORIDE 0.9 % IJ SOLN
3.0000 mL | Freq: Two times a day (BID) | INTRAMUSCULAR | Status: DC
  Administered 2015-02-27: 3 mL via INTRAVENOUS

## 2015-02-26 MED ORDER — SODIUM CHLORIDE 0.9 % IV SOLN
INTRAVENOUS | Status: DC
  Administered 2015-02-27 (×2): via INTRAVENOUS

## 2015-02-26 MED ORDER — ONDANSETRON HCL 4 MG/2ML IJ SOLN
4.0000 mg | Freq: Once | INTRAMUSCULAR | Status: DC
  Filled 2015-02-26: qty 2

## 2015-02-26 NOTE — ED Notes (Signed)
Attempted report Charge nurse states room is not assigned yet.

## 2015-02-26 NOTE — H&P (Signed)
[facility] History and Physical  Patient: Stephanie Zhang  MRN: [id number]  DOB: [birth year]  DOS: the patient was seen and examined on 02/26/2015 PCP: Nyoka Cowden, MD  Chief Complaint: Generalized weakness  HPI: Stephanie Zhang is a 54 y.o. female with Past medical history of multiple sclerosis on interferon, GERD, chronic pain, gastroparesis. The patient is presenting with complaints of nausea vomiting diarrhea as well as generalized weakness. He mentions day before yesterday she was stable and at her baseline. She mentions one other resident at her living facility had some GI symptoms. She mentions that she started having complaints of nausea and vomiting earlier in the morning and later on followed by diarrhea. She may have 8-10 episodes of vomiting as well as 4-5 episodes of diarrhea on without any blood. Radiological significantly weak and lethargic and was unable to mobilize. She generally mobilized with a wheelchair or walker with assistance. She also has missed her Friday dose of interferon. She denies any new vision changes and mentions has chronic blurred vision. She denies any chest pain abdominal pain currently at the time of my evaluation. Her vomiting has stopped since afternoon and her nausea is persistent.  The patient is coming from ALF And at her baseline independent for most of her ADL.  Review of Systems: as mentioned in the history of present illness.  A Comprehensive review of the other systems is negative.  Past Medical History  Diagnosis Date  . MS (multiple sclerosis) 1987  . Hypoglycemia   . Multiple sclerosis   . GERD (gastroesophageal reflux disease)   . Osteoporosis   . Anxiety   . Blood transfusion     "when I was born"  . Headache(784.0)     "lots"  . Chronic back pain   . Depression   . Confusion   . Short-term memory loss   . Breast cancer 10/2001    Stage II (right side)  . Gastroparesis    Past Surgical History   Procedure Laterality Date  . Breast lumpectomy  2002    right  . Breast biopsy  2002    right  . Colonoscopy N/A 04/21/2013    Procedure: COLONOSCOPY;  Surgeon: Ladene Artist, MD;  Location: [facility];  Service: Endoscopy;  Laterality: N/A;   Social History:  reports that she quit smoking about 3 years ago. She has never used smokeless tobacco. She reports that she does not drink alcohol or use illicit drugs.  Allergies  Allergen Reactions  . Sulfa Antibiotics Itching and Rash  . Sulfacetamide Sodium Itching and Rash    Family History  Problem Relation Age of Onset  . Diabetes Mother   . Cancer Mother     cervical cancer  . Heart disease Mother   . Diabetes Father     Prior to Admission medications   Medication Sig Start Date End Date Taking? Authorizing Provider  amantadine (SYMMETREL) 100 MG capsule Take 1 capsule (100 mg total) by mouth 2 (two) times daily. 02/23/13  Yes Marletta Lor, MD  buPROPion [facility]) 75 MG tablet Take 2 tablets (150 mg total) by mouth daily. 02/23/13  Yes Marletta Lor, MD  Calcium Carbonate-Vitamin D 600-400 MG-UNIT per tablet Take 1 tablet by mouth daily.   Yes Historical Provider, MD  diphenhydrAMINE (BENADRYL) 25 MG tablet Take 25 mg by mouth every 6 (six) hours as needed for itching.   Yes Historical Provider, MD  docusate sodium (COLACE) 100 MG capsule Take 100 mg by  mouth 2 (two) times daily.   Yes Historical Provider, MD  guaiFENesin (ROBITUSSIN) 100 MG/5ML liquid Take 200 mg by mouth every 6 (six) hours as needed for cough.   Yes Historical Provider, MD  ibuprofen (ADVIL,MOTRIN) 800 MG tablet Take 800 mg by mouth every 8 (eight) hours as needed (pain).    Yes Historical Provider, MD  interferon beta-1a (REBIF) 44 MCG/0.5ML injection Inject 44 mcg into the skin every Monday, Wednesday, and Friday.    Yes Historical Provider, MD  loperamide (IMODIUM) 2 MG capsule Take 2 mg by mouth every 4 (four) hours as needed for diarrhea or  loose stools.    Yes Historical Provider, MD  oxybutynin (DITROPAN) 5 MG tablet Take 1 tablet (5 mg total) by mouth daily. 02/23/13  Yes Marletta Lor, MD  pantoprazole (PROTONIX) 40 MG tablet Take 40 mg by mouth daily.   Yes Historical Provider, MD  polyethylene glycol (MIRALAX / GLYCOLAX) packet Take 17 g by mouth daily. 02/23/13  Yes Marletta Lor, MD  senna (SENOKOT) 8.6 MG tablet Take 1 tablet by mouth daily.   Yes Historical Provider, MD  Skin Protectants, Misc. (EUCERIN) cream Apply 1 application topically 2 (two) times daily as needed for dry skin.   Yes Historical Provider, MD  HYDROcodone-acetaminophen (NORCO/VICODIN) 5-325 MG per tablet Take 1 tablet by mouth every 4 (four) hours as needed for moderate pain. Patient not taking: Reported on 02/26/2015 03/27/14   Gertha Calkin, PA-C    Physical Exam: Filed Vitals:   02/26/15 2045 02/26/15 2131 02/26/15 2146 02/26/15 2200  BP: 113/69 102/77 104/68 105/66  Pulse: 108 104 101 94  Temp:      TempSrc:      Resp:      SpO2: 92% 100% 100% 99%    General: Alert, Awake and Oriented to Time, Place and Person. Appear in moderate distress Eyes: PERRL ENT: Oral Mucosa clear moist. Neck: no JVD Cardiovascular: S1 and S2 Present, no Murmur, Peripheral Pulses Present Respiratory: Bilateral Air entry equal and Decreased, Clear to Auscultation, noCrackles, no wheezes Abdomen: Bowel Sound present, Soft and non tender Skin: no Rash Extremities: no Pedal edema, no calf tenderness Neurologic: Grossly no focal neuro deficit. Motor strength 5/5 both upper and lower extremity. Sensations are present both bilaterally equal. Babinski is negative.  Labs on Admission:  CBC:  Recent Labs Lab 02/26/15 1646  WBC 8.2  NEUTROABS 7.6  HGB 13.3  HCT 40.8  MCV 89.5  PLT 157    CMP     Component Value Date/Time   NA 137 02/26/2015 1646   K 3.8 02/26/2015 1646   CL 105 02/26/2015 1646   CO2 25 02/26/2015 1646   GLUCOSE 177*  02/26/2015 1646   BUN 17 02/26/2015 1646   CREATININE 0.84 02/26/2015 1646   CALCIUM 8.9 02/26/2015 1646   PROT 7.2 02/26/2015 1646   ALBUMIN 3.6 02/26/2015 1646   AST 25 02/26/2015 1646   ALT 19 02/26/2015 1646   ALKPHOS 134* 02/26/2015 1646   BILITOT 1.0 02/26/2015 1646   GFRNONAA 78* 02/26/2015 1646   GFRAA >90 02/26/2015 1646    No results for input(s): LIPASE, AMYLASE in the last 168 hours.  No results for input(s): CKTOTAL, CKMB, CKMBINDEX, TROPONINI in the last 168 hours. BNP (last 3 results) No results for input(s): BNP in the last 8760 hours.  ProBNP (last 3 results) No results for input(s): PROBNP in the last 8760 hours.   Radiological Exams on Admission: Dg Abd Acute  W/chest  02/26/2015   CLINICAL DATA:  Vomiting and dehydration  EXAM: ACUTE ABDOMEN SERIES (ABDOMEN 2 VIEW & CHEST 1 VIEW)  COMPARISON:  None.  FINDINGS: Lungs are clear.  No free air beneath the diaphragm.  There are gas-filled loops of small bowel measuring up to 3 cm which is upper limits of normal. No air-fluid levels. There is gas within the rectum and colon. No intraperitoneal free air on the decubitus view.  IMPRESSION: 1.  No acute cardiopulmonary process. 2. Prominent loops of small bowel upper limits of normal. No air-fluid levels. 3. No evidence of bowel obstruction or intraperitoneal free air   Electronically Signed   By: Suzy Bouchard M.D.   On: 02/26/2015 20:17    Assessment/Plan Principal Problem:   UTI (lower urinary tract infection) Active Problems:   Acute gastroenteritis   MS (multiple sclerosis)   Diarrhea   Gastroparesis   1. UTI (lower urinary tract infection) The patient is presenting with complaints of denies weakness fatigue nausea vomiting and diarrhea. Workup shows positive UA with mild leukocytosis and fever. Most likely the patient has UTI and will be treated with antibiotics. Continue culture.  2. Acute gastroenteritis. Most likely viral in nature. X-ray of the  abdomen shows upper limit of normal small bowels. Check C. difficile. Entry percussion were normal vitals. Avoid Protonix. Use Pepcid. Zofran as needed. Lipase and lactic acid negative.  3. Multiple sclerosis. Serial neuro checks and telemetry monitoring to monitor flareup secondary to possible infection and dehydration. Patient will also receive her pending dose of interferon,.  4. Gastroparesis. Currently holding her stool softener in view of ongoing diarrhea and generalized weakness.  Advance goals of care discussion: Full code  DVT Prophylaxis: subcutaneous Heparin Nutrition: Regular diet  Family Communication: Family was present at bedside, opportunity was given to ask question and all questions were answered satisfactorily at the time of interview. Disposition: Admitted to observation in telemetry unit.  Author: Berle Mull, MD [facility] Pager: [phone] 02/26/2015, 10:16 PM    If 7PM-7AM, please contact night-coverage [email or web address] Password [email or web address]

## 2015-02-26 NOTE — ED Notes (Signed)
Pt was sent from an ucc for dehydration.  Shes had diarrhea and low grade fever since last night. Family states pt has MS and has diarrhea often. Pt denies pain.

## 2015-02-26 NOTE — ED Provider Notes (Signed)
CSN: [id number]     Arrival date & time 02/26/15  1610 History   First MD Initiated Contact with Patient 02/26/15 1721     Chief Complaint  Patient presents with  . Dehydration     (Consider location/radiation/quality/duration/timing/severity/associated sxs/prior Treatment) HPI    PCP: Nyoka Cowden, MD - Pt stays at [facility], [place] Blood pressure 123/84, pulse 117, temperature 99.3 F (37.4 C), temperature source Oral, resp. rate 18, last menstrual period 08/09/2008, SpO2 98 %.  Stephanie Zhang is a 54 y.o.female with a significant PMH of MS, hypoglycemia, GERD, headache, depression, confusion, breast cancer, gastroparesis, chronic pain presents to the ER with complaints of  Pt to the ER sent in by the Urgent Care for vomiting and dehydration. She reports vomiting 5- 10 times all solid food since yesterday. Has tolerate small amount of fluids. She also has some mild abdominal pain that she describes as cramping and intermittent. When she has pains she either has vomiting or diarrhea with it. The family members that are present report symptoms ongoing for 1 week. The patient is typically wheel chair or walker bound at baseline and has been too week to get up and ambulate today or do her daily exercises. Reports one of the residence in her living facility has similar symptoms. Takes  interferon beta-1a (REBIF) 44 MCG/0.5ML injection three times a week.  Negative Review of Symptoms: dysuria, vaginal complaints, flank pain, chest pain, SOB, arthralgias/myalgias, headaches, rash, sore throat, cough,    Past Medical History  Diagnosis Date  . MS (multiple sclerosis) 1987  . Hypoglycemia   . Multiple sclerosis   . GERD (gastroesophageal reflux disease)   . Osteoporosis   . Anxiety   . Blood transfusion     "when I was born"  . Headache(784.0)     "lots"  . Chronic back pain   . Depression   . Confusion   . Short-term memory loss   . Breast cancer  10/2001    Stage II (right side)  . Gastroparesis    Past Surgical History  Procedure Laterality Date  . Breast lumpectomy  2002    right  . Breast biopsy  2002    right  . Colonoscopy N/A 04/21/2013    Procedure: COLONOSCOPY;  Surgeon: Ladene Artist, MD;  Location: [facility];  Service: Endoscopy;  Laterality: N/A;   Family History  Problem Relation Age of Onset  . Diabetes Mother   . Cancer Mother     cervical cancer  . Heart disease Mother   . Diabetes Father    History  Substance Use Topics  . Smoking status: Former Smoker    Quit date: 05/11/2011  . Smokeless tobacco: Never Used  . Alcohol Use: No     Comment: "last alcohol 12/04/11"   OB History    Gravida Para Term Preterm AB TAB SAB Ectopic Multiple Living   1 0   1 1    0     Review of Systems  10 Systems reviewed and are negative for acute change except as noted in the HPI.    Allergies  Sulfa antibiotics and Sulfacetamide sodium  Home Medications   Prior to Admission medications   Medication Sig Start Date End Date Taking? Authorizing Provider  amantadine (SYMMETREL) 100 MG capsule Take 1 capsule (100 mg total) by mouth 2 (two) times daily. 02/23/13   Marletta Lor, MD  buPROPion [facility]) 75 MG tablet Take 2 tablets (150 mg total)  by mouth daily. 02/23/13   Marletta Lor, MD  Calcium-Vitamin D (CALTRATE 600 PLUS-VIT D PO) Take 1 tablet by mouth daily.     Historical Provider, MD  clonazePAM (KLONOPIN) 0.5 MG tablet Take 0.5 mg by mouth 2 (two) times daily as needed for anxiety.    Historical Provider, MD  docusate sodium (COLACE) 100 MG capsule Take 100 mg by mouth 2 (two) times daily.    Historical Provider, MD  HYDROcodone-acetaminophen (NORCO/VICODIN) 5-325 MG per tablet Take 1 tablet by mouth every 4 (four) hours as needed for moderate pain. 03/27/14   Gertha Calkin, PA-C  ibuprofen (ADVIL,MOTRIN) 800 MG tablet Take 800 mg by mouth every 8 (eight) hours as needed. As needed for  pain.    Historical Provider, MD  interferon beta-1a (REBIF) 44 MCG/0.5ML injection Inject 44 mcg into the skin 3 (three) times a week.     Historical Provider, MD  loperamide (IMODIUM) 2 MG capsule Take 2 mg by mouth every 4 (four) hours as needed for diarrhea or loose stools.     Historical Provider, MD  oxybutynin (DITROPAN) 5 MG tablet Take 1 tablet (5 mg total) by mouth daily. 02/23/13   Marletta Lor, MD  pantoprazole (PROTONIX) 40 MG tablet Take 40 mg by mouth daily.    Historical Provider, MD  polyethylene glycol (MIRALAX / GLYCOLAX) packet Take 17 g by mouth daily. 02/23/13   Marletta Lor, MD  senna (SENOKOT) 8.6 MG tablet Take 1 tablet by mouth daily.    Historical Provider, MD  Skin Protectants, Misc. (EUCERIN) cream Apply 1 application topically 2 (two) times daily as needed for dry skin.    Historical Provider, MD   BP 131/81 mmHg  Pulse 101  Temp(Src) 99.3 F (37.4 C) (Oral)  Resp 18  SpO2 98%  LMP 08/09/2008 Physical Exam  Constitutional: She appears well-developed and well-nourished. She appears lethargic. She appears ill. No distress.  HENT:  Head: Normocephalic and atraumatic.  Eyes: Pupils are equal, round, and reactive to light.  Neck: Normal range of motion. Neck supple.  Cardiovascular: Normal rate and regular rhythm.   Pulmonary/Chest: Effort normal.  Abdominal: Soft. Bowel sounds are normal. She exhibits no distension. There is tenderness (mild diffuse). There is no rebound, no guarding and no CVA tenderness.  Neurological: She appears lethargic. She displays atrophy.  Generalized weakness  Skin: Skin is warm and dry.  Nursing note and vitals reviewed.   ED Course  Procedures (including critical care time) Labs Review Labs Reviewed  CBC WITH DIFFERENTIAL/PLATELET - Abnormal; Notable for the following:    Neutrophils Relative % 92 (*)    Lymphocytes Relative 4 (*)    Lymphs Abs 0.3 (*)    All other components within normal limits   COMPREHENSIVE METABOLIC PANEL - Abnormal; Notable for the following:    Glucose, Bld 177 (*)    Alkaline Phosphatase 134 (*)    GFR calc non Af Amer 78 (*)    All other components within normal limits  URINALYSIS, ROUTINE W REFLEX MICROSCOPIC - Abnormal; Notable for the following:    APPearance CLOUDY (*)    Bilirubin Urine SMALL (*)    Ketones, ur 15 (*)    Leukocytes, UA MODERATE (*)    All other components within normal limits  URINE MICROSCOPIC-ADD ON - Abnormal; Notable for the following:    Squamous Epithelial / LPF MANY (*)    Bacteria, UA MANY (*)    All other components within normal limits  CULTURE, BLOOD (ROUTINE X 2)  CULTURE, BLOOD (ROUTINE X 2)  I-STAT CG4 LACTIC ACID, ED    Imaging Review Dg Abd Acute W/chest  02/26/2015   CLINICAL DATA:  Vomiting and dehydration  EXAM: ACUTE ABDOMEN SERIES (ABDOMEN 2 VIEW & CHEST 1 VIEW)  COMPARISON:  None.  FINDINGS: Lungs are clear.  No free air beneath the diaphragm.  There are gas-filled loops of small bowel measuring up to 3 cm which is upper limits of normal. No air-fluid levels. There is gas within the rectum and colon. No intraperitoneal free air on the decubitus view.  IMPRESSION: 1.  No acute cardiopulmonary process. 2. Prominent loops of small bowel upper limits of normal. No air-fluid levels. 3. No evidence of bowel obstruction or intraperitoneal free air   Electronically Signed   By: Suzy Bouchard M.D.   On: 02/26/2015 20:17     EKG Interpretation None      MDM   Final diagnoses:  Weakness  Nausea, vomiting and diarrhea  Generalized abdominal pain    Patient on Interferon comes to the ER with 1 week of N/V/D, she is walker/wheel chair bound at a [facility]. They administer medications and help her take a bath. She is typically very active but is currently so weak she was unable to get out of bed and get herself dressed today. Her Urinalysis shows she has a UTI, moderate leuks with  21-50 WBC. Will  treat with IV Rocephin and admit to medicine. Abd and chest xray show no acute findings.  Patient and family members are agreeable to patients admission.  Discussed case with Dr. Hal Hope. He would like to see lipase and lactic acid tests result. He will see patient and determine if she needs admission at that point.  Filed Vitals:   02/26/15 2313  BP:   Pulse:   Temp: 99.1 F (37.3 C)  Resp:     Plan for admission @ [place], PA-C 02/26/15 2318  Tanna Furry, MD 02/27/15 2234

## 2015-02-26 NOTE — ED Notes (Signed)
Spoke with Lab about patients urine states they are going to process now.

## 2015-02-26 NOTE — ED Notes (Signed)
Patient states her bra was taken off at x-ray.

## 2015-02-27 DIAGNOSIS — Z87891 Personal history of nicotine dependence: Secondary | ICD-10-CM | POA: Diagnosis not present

## 2015-02-27 DIAGNOSIS — Z853 Personal history of malignant neoplasm of breast: Secondary | ICD-10-CM | POA: Diagnosis not present

## 2015-02-27 DIAGNOSIS — G8929 Other chronic pain: Secondary | ICD-10-CM | POA: Diagnosis present

## 2015-02-27 DIAGNOSIS — M810 Age-related osteoporosis without current pathological fracture: Secondary | ICD-10-CM | POA: Diagnosis present

## 2015-02-27 DIAGNOSIS — M549 Dorsalgia, unspecified: Secondary | ICD-10-CM | POA: Diagnosis present

## 2015-02-27 DIAGNOSIS — E860 Dehydration: Secondary | ICD-10-CM | POA: Diagnosis present

## 2015-02-27 DIAGNOSIS — N390 Urinary tract infection, site not specified: Secondary | ICD-10-CM | POA: Diagnosis present

## 2015-02-27 DIAGNOSIS — G35 Multiple sclerosis: Secondary | ICD-10-CM | POA: Diagnosis present

## 2015-02-27 DIAGNOSIS — R531 Weakness: Secondary | ICD-10-CM | POA: Diagnosis present

## 2015-02-27 DIAGNOSIS — K529 Noninfective gastroenteritis and colitis, unspecified: Secondary | ICD-10-CM | POA: Diagnosis present

## 2015-02-27 DIAGNOSIS — Z993 Dependence on wheelchair: Secondary | ICD-10-CM | POA: Diagnosis not present

## 2015-02-27 DIAGNOSIS — F419 Anxiety disorder, unspecified: Secondary | ICD-10-CM | POA: Diagnosis present

## 2015-02-27 DIAGNOSIS — F329 Major depressive disorder, single episode, unspecified: Secondary | ICD-10-CM | POA: Diagnosis present

## 2015-02-27 DIAGNOSIS — K3184 Gastroparesis: Secondary | ICD-10-CM | POA: Diagnosis present

## 2015-02-27 DIAGNOSIS — Z882 Allergy status to sulfonamides status: Secondary | ICD-10-CM | POA: Diagnosis not present

## 2015-02-27 DIAGNOSIS — K219 Gastro-esophageal reflux disease without esophagitis: Secondary | ICD-10-CM | POA: Diagnosis present

## 2015-02-27 LAB — COMPREHENSIVE METABOLIC PANEL
ALT: 18 U/L (ref 0–35)
AST: 22 U/L (ref 0–37)
Albumin: 2.5 g/dL — ABNORMAL LOW (ref 3.5–5.2)
Alkaline Phosphatase: 93 U/L (ref 39–117)
Anion gap: 5 (ref 5–15)
BUN: 14 mg/dL (ref 6–23)
CO2: 20 mmol/L (ref 19–32)
CREATININE: 0.79 mg/dL (ref 0.50–1.10)
Calcium: 7.3 mg/dL — ABNORMAL LOW (ref 8.4–10.5)
Chloride: 111 mmol/L (ref 96–112)
GFR calc Af Amer: >90 mL/min (ref >90)
GFR calc non Af Amer: >90 mL/min (ref >90)
GLUCOSE: 89 mg/dL (ref 70–99)
POTASSIUM: 3.1 mmol/L — AB (ref 3.5–5.1)
SODIUM: 136 mmol/L (ref 135–145)
TOTAL PROTEIN: 5 g/dL — AB (ref 6.0–8.3)
Total Bilirubin: 0.8 mg/dL (ref 0.3–1.2)

## 2015-02-27 LAB — CBC WITH DIFFERENTIAL/PLATELET
Basophils Absolute: 0 10*3/uL (ref 0.0–0.1)
Basophils Relative: 0 % (ref 0–1)
Eosinophils Absolute: 0 10*3/uL (ref 0.0–0.7)
Eosinophils Relative: 1 % (ref 0–5)
HCT: 30.7 % — ABNORMAL LOW (ref 36.0–46.0)
HEMOGLOBIN: 10.1 g/dL — AB (ref 12.0–15.0)
LYMPHS ABS: 0.9 10*3/uL (ref 0.7–4.0)
Lymphocytes Relative: 23 % (ref 12–46)
MCH: 29.2 pg (ref 26.0–34.0)
MCHC: 32.9 g/dL (ref 30.0–36.0)
MCV: 88.7 fL (ref 78.0–100.0)
MONOS PCT: 9 % (ref 3–12)
Monocytes Absolute: 0.4 10*3/uL (ref 0.1–1.0)
Neutro Abs: 2.7 10*3/uL (ref 1.7–7.7)
Neutrophils Relative %: 67 % (ref 43–77)
Platelets: 121 10*3/uL — ABNORMAL LOW (ref 150–400)
RBC: 3.46 MIL/uL — ABNORMAL LOW (ref 3.87–5.11)
RDW: 14.2 % (ref 11.5–15.5)
WBC: 4 10*3/uL (ref 4.0–10.5)

## 2015-02-27 MED ORDER — FAMOTIDINE 20 MG PO TABS
20.0000 mg | ORAL_TABLET | Freq: Two times a day (BID) | ORAL | Status: DC
  Administered 2015-02-27 – 2015-02-28 (×2): 20 mg via ORAL
  Filled 2015-02-27 (×3): qty 1

## 2015-02-27 MED ORDER — ENOXAPARIN SODIUM 40 MG/0.4ML ~~LOC~~ SOLN
40.0000 mg | SUBCUTANEOUS | Status: DC
  Filled 2015-02-27: qty 0.4

## 2015-02-27 MED ORDER — CEFTRIAXONE SODIUM IN DEXTROSE 20 MG/ML IV SOLN
1.0000 g | INTRAVENOUS | Status: DC
  Administered 2015-02-27 – 2015-02-28 (×2): 1 g via INTRAVENOUS
  Filled 2015-02-27 (×3): qty 50

## 2015-02-27 NOTE — Progress Notes (Signed)
Pt placed on enteric precautions on admission for r/o Cdiff. Darreld Mclean [facility]

## 2015-02-27 NOTE — Clinical Social Work Psychosocial (Signed)
     Clinical Social Work Department BRIEF PSYCHOSOCIAL ASSESSMENT 02/27/2015  Patient:  Stephanie Zhang, Stephanie Zhang     Account Number:  [id number]     Admit date:  02/26/2015  Clinical Social Worker:  Elam Dutch  Date/Time:  02/27/2015 02:53 PM  Referred by:  Physician  Date Referred:  02/27/2015 Referred for  SNF Placement   Other Referral:   *Should be able to return to ALF- awaiting PT evaluation   Interview type:  Patient Other interview type:    PSYCHOSOCIAL DATA Living Status:  FACILITY Admitted from facility:  [facility] Level of care:  Assisted Living Primary support name:  Stephanie Zhang  [phone] Primary support relationship to patient:  SIBLING Degree of support available:   Strong support from siblings per patient. Has multiple sisters and a brother  Sister:  Stephanie Zhang  [address] Current Concerns  Post-Acute Placement   Other Concerns:   SNF order in place vs return to ALF.    SOCIAL WORK ASSESSMENT / PLAN 54 year old female admitted from [facility]. CSW order received to assist patient with SNF placement- however PT has not seen patient yet to determine recommendation. Patient wants to return to her "apartment" at ALF and states that she is very happy there.  CSW was unable to reach admissions at the facility today but can follow up tomorrow.  Fl2 initiated and placed on chart for MD's signature.   Assessment/plan status:  Psychosocial Support/Ongoing Assessment of Needs Other assessment/ plan:   Information/referral to community resources:   None at this time    PATIENTS/FAMILYS RESPONSE TO PLAN OF CARE: Patient is alert and oriented; upon entering the room patient's facial expression was noted to be one of sadness or pain.  Patient related that her head was hurting alot but she did not tell the nurse. CSW relayed this to nursing for follow up.  Patient states that she feels tired after having so much  nausea and vomiting at home.  She deneis any vomiting since admission to the hospital.  Patient states that she has been a resident of [facility] for about 4 years- and entered the facility after her husband's death. She states that she has a wheelchair that she sometimes relies on "too much" but also can use a walker. Patient states that she loves the ALF and that they are "good to me"- wants to return there and would accept home health for PT and other services if needed.  Awaiting PT's evaluation to determine if she is safe to return there. CSW will also need to follow up with staff at [facility] to determine if they feel they can manage patient.  She is hopeful to return there. Per patient's permission- CSW contacted her sister Judson Roch who stated that her sister is well managed at the facility and that they are extremely pleased with the care she receives there. The family would prefer that she return to the ALF when medically stable.  CSW services will follow and assist as needed.

## 2015-02-27 NOTE — Progress Notes (Signed)
UR completed 

## 2015-02-27 NOTE — Progress Notes (Signed)
Dr. Mamie Nick. Patel paged for order clarification for PO protonix, protonix discontinued in [facility] by MD. Darreld Mclean [facility]

## 2015-02-27 NOTE — Progress Notes (Signed)
NURSING PROGRESS NOTE  [name] [id number] Admitted to 6L46: 02/26/2015 at 2330  Attending Provider: Lavina Hamman, MD    Stephanie Zhang is a 54 y.o. female patient admitted from ED awake, alert  & orientated  X 4,  Full Code, VSS - Blood pressure 102/53, pulse 90, temperature 99.1 F (37.3 C), temperature source Oral, resp. rate 18, last menstrual period 08/09/2008, SpO2 98 %.RA, no c/o shortness of breath, no c/o chest pain, no distress noted. Tele #10 placed and pt is currently running:Sinus rhythm    IV site WDL:  antecubital right, condition patent and no redness with a transparent dsg that's clean dry and intact.  Allergies:   Allergies  Allergen Reactions  . Sulfa Antibiotics Itching and Rash  . Sulfacetamide Sodium Itching and Rash     Past Medical History  Diagnosis Date  . MS (multiple sclerosis) 1987  . Hypoglycemia   . Multiple sclerosis   . GERD (gastroesophageal reflux disease)   . Osteoporosis   . Anxiety   . Blood transfusion     "when I was born"  . Headache(784.0)     "lots"  . Chronic back pain   . Depression   . Confusion   . Short-term memory loss   . Breast cancer 10/2001    Stage II (right side)  . Gastroparesis     History:  obtained from the patient.  Pt orientation to unit, room and routine. Information packet given to patient/family and safety video watched.  Admission INP armband ID verified with patient/family, and in place. SR up x 2, fall risk assessment complete with Patient and family verbalizing understanding of risks associated with falls. Pt verbalizes an understanding of how to use the call bell and to call for help before getting out of bed.  Skin, clean-dry- intact without evidence of bruising, or skin tears. Non blanchable redness to sacral fold, barrier cream applied    Will cont to monitor and assist as needed.  Darreld Mclean [place], RN 02/27/2015

## 2015-02-27 NOTE — Progress Notes (Signed)
Patient Demographics  Stephanie Zhang, is a 54 y.o. female, DOB - 22-Sep-1961, NLG:[id number]  Admit date - 02/26/2015   Admitting Physician Lavina Hamman, MD  Outpatient Primary MD for the patient is Nyoka Cowden, MD  LOS -    Chief Complaint  Patient presents with  . Dehydration      Admission history of present illness/brief narrative: Stephanie Zhang is a 54 y.o. female with Past medical history of multiple sclerosis on interferon, GERD, chronic pain, gastroparesis.  presenting with complaints of nausea vomiting diarrhea as well as generalized weakness. She mentions one other resident at her living facility had some GI symptoms. Known to have history of multiple sclerosis , generally mobilized with a wheelchair or walker with assistance. denies any new vision changes and mentions has chronic blurred vision. She denies any chest pain abdominal pain currently at the time of my evaluation.   Subjective:   [name] today has, No headache, No chest pain, No abdominal pain - No Nausea, , No Cough - SOB.   Assessment & Plan    Principal Problem:   UTI (lower urinary tract infection) Active Problems:   Acute gastroenteritis   MS (multiple sclerosis)   Diarrhea   Gastroparesis  UTI - Continue with IV Rocephin, follow on urine culture  Gastroenteritis - Patient presents with nausea, vomiting, diarrhea, this is most likely related to gastroenteritis, she reports other residents at the facility has it. - January gentle hydration. - Follow on C. difficile results.  Multiple sclerosis - Monitor closely as patient with UTI and gastroenteritis - PT/OT consult  Code Status: Full code  Family Communication: None at bedside  Disposition Plan: Pending further workup   Procedures  None   Consults   None   Medications  Scheduled Meds: .  amantadine  100 mg Oral BID  . buPROPion  150 mg Oral Daily  . cefTRIAXone (ROCEPHIN)  IV  1 g Intravenous Q24H  . enoxaparin (LOVENOX) injection  30 mg Subcutaneous Q24H  . famotidine  20 mg Oral BID  . [START ON 02/28/2015] interferon beta-1a  44 mcg Subcutaneous Q M,W,F  . oxybutynin  5 mg Oral Daily  . sodium chloride  3 mL Intravenous Q12H   Continuous Infusions: . sodium chloride 100 mL/hr at 02/27/15 0008   PRN Meds:.acetaminophen **OR** acetaminophen, ondansetron **OR** ondansetron (ZOFRAN) IV  DVT Prophylaxis  Lovenox -   Lab Results  Component Value Date   PLT 121* 02/27/2015    Antibiotics    Anti-infectives    Start     Dose/Rate Route Frequency Ordered Stop   02/27/15 2000  cefTRIAXone (ROCEPHIN) 1 g in dextrose 5 % 50 mL IVPB - Premix     1 g 100 mL/hr over 30 Minutes Intravenous Every 24 hours 02/27/15 0753     02/26/15 1930  cefTRIAXone (ROCEPHIN) 1 g in dextrose 5 % 50 mL IVPB     1 g 100 mL/hr over 30 Minutes Intravenous  Once 02/26/15 1922 02/26/15 2029          Objective:   Filed Vitals:   02/26/15 2313 02/27/15 0039 02/27/15 0531 02/27/15 1339  BP:   103/58 111/67  Pulse:   96 94  Temp: 99.1 F (  37.3 C)  98 F (36.7 C) 99.3 F (37.4 C)  TempSrc: Oral   Oral  Resp:   18 18  Height:  5\' 3"  (1.6 m)    Weight:  53.207 kg (117 lb 4.8 oz) 53.5 kg (117 lb 15.1 oz)   SpO2:   94% 98%    Wt Readings from Last 3 Encounters:  02/27/15 53.5 kg (117 lb 15.1 oz)  08/31/14 51.438 kg (113 lb 6.4 oz)  07/21/14 49.896 kg (110 lb)     Intake/Output Summary (Last 24 hours) at 02/27/15 1443 Last data filed at 02/27/15 1048  Gross per 24 hour  Intake 976.67 ml  Output    400 ml  Net 576.67 ml     Physical Exam  Awake Alert, Oriented X 3, No new F.N deficits, Normal affect [place].AT,PERRAL Supple Neck,No JVD, No cervical lymphadenopathy appriciated.  Symmetrical Chest wall movement, Good air movement bilaterally, CTAB RRR,No Gallops,Rubs or new  Murmurs, No Parasternal Heave +ve B.Sounds, Abd Soft, No tenderness, No organomegaly appriciated, No rebound - guarding or rigidity. No Cyanosis, Clubbing or edema, No new Rash or bruise     Data Review   Micro Results No results found for this or any previous visit (from the past 240 hour(s)).  Radiology Reports Dg Abd Acute W/chest  02/26/2015   CLINICAL DATA:  Vomiting and dehydration  EXAM: ACUTE ABDOMEN SERIES (ABDOMEN 2 VIEW & CHEST 1 VIEW)  COMPARISON:  None.  FINDINGS: Lungs are clear.  No free air beneath the diaphragm.  There are gas-filled loops of small bowel measuring up to 3 cm which is upper limits of normal. No air-fluid levels. There is gas within the rectum and colon. No intraperitoneal free air on the decubitus view.  IMPRESSION: 1.  No acute cardiopulmonary process. 2. Prominent loops of small bowel upper limits of normal. No air-fluid levels. 3. No evidence of bowel obstruction or intraperitoneal free air   Electronically Signed   By: Suzy Bouchard M.D.   On: 02/26/2015 20:17    CBC  Recent Labs Lab 02/26/15 1646 02/27/15 0700  WBC 8.2 4.0  HGB 13.3 10.1*  HCT 40.8 30.7*  PLT 157 121*  MCV 89.5 88.7  MCH 29.2 29.2  MCHC 32.6 32.9  RDW 13.9 14.2  LYMPHSABS 0.3* 0.9  MONOABS 0.2 0.4  EOSABS 0.1 0.0  BASOSABS 0.0 0.0    Chemistries   Recent Labs Lab 02/26/15 1646 02/27/15 0700  NA 137 136  K 3.8 3.1*  CL 105 111  CO2 25 20  GLUCOSE 177* 89  BUN 17 14  CREATININE 0.84 0.79  CALCIUM 8.9 7.3*  AST 25 22  ALT 19 18  ALKPHOS 134* 93  BILITOT 1.0 0.8   ------------------------------------------------------------------------------------------------------------------ estimated creatinine clearance is 67.3 mL/min (by C-G formula based on Cr of 0.79). ------------------------------------------------------------------------------------------------------------------ No results for input(s): HGBA1C in the last 72  hours. ------------------------------------------------------------------------------------------------------------------ No results for input(s): CHOL, HDL, LDLCALC, TRIG, CHOLHDL, LDLDIRECT in the last 72 hours. ------------------------------------------------------------------------------------------------------------------ No results for input(s): TSH, T4TOTAL, T3FREE, THYROIDAB in the last 72 hours.  Invalid input(s): FREET3 ------------------------------------------------------------------------------------------------------------------ No results for input(s): VITAMINB12, FOLATE, FERRITIN, TIBC, IRON, RETICCTPCT in the last 72 hours.  Coagulation profile No results for input(s): INR, PROTIME in the last 168 hours.  No results for input(s): DDIMER in the last 72 hours.  Cardiac Enzymes No results for input(s): CKMB, TROPONINI, MYOGLOBIN in the last 168 hours.  Invalid input(s): CK ------------------------------------------------------------------------------------------------------------------ Invalid input(s): POCBNP     Time  Spent in minutes   30 minutes   Kikuye Korenek M.D on 02/27/2015 at 2:43 PM  Between 7am to 7pm - Pager - [phone]  After 7pm go to [email or web address] - password [email or web address]  And look for the night coverage person covering for me after hours  [facility] Office  [phone]   **Disclaimer: This note may have been dictated with voice recognition software. Similar sounding words can inadvertently be transcribed and this note may contain transcription errors which may not have been corrected upon publication of note.**

## 2015-02-28 LAB — CBC
HCT: 31.9 % — ABNORMAL LOW (ref 36.0–46.0)
Hemoglobin: 10.2 g/dL — ABNORMAL LOW (ref 12.0–15.0)
MCH: 28.8 pg (ref 26.0–34.0)
MCHC: 32 g/dL (ref 30.0–36.0)
MCV: 90.1 fL (ref 78.0–100.0)
PLATELETS: 113 10*3/uL — AB (ref 150–400)
RBC: 3.54 MIL/uL — AB (ref 3.87–5.11)
RDW: 13.9 % (ref 11.5–15.5)
WBC: 4.1 10*3/uL (ref 4.0–10.5)

## 2015-02-28 LAB — URINE CULTURE

## 2015-02-28 LAB — BASIC METABOLIC PANEL
ANION GAP: 5 (ref 5–15)
BUN: 7 mg/dL (ref 6–23)
CHLORIDE: 114 mmol/L — AB (ref 96–112)
CO2: 24 mmol/L (ref 19–32)
Calcium: 8 mg/dL — ABNORMAL LOW (ref 8.4–10.5)
Creatinine, Ser: 0.72 mg/dL (ref 0.50–1.10)
GFR calc non Af Amer: >90 mL/min (ref >90)
Glucose, Bld: 81 mg/dL (ref 70–99)
POTASSIUM: 3.1 mmol/L — AB (ref 3.5–5.1)
Sodium: 143 mmol/L (ref 135–145)

## 2015-02-28 LAB — CLOSTRIDIUM DIFFICILE BY PCR: CDIFFPCR: NEGATIVE

## 2015-02-28 MED ORDER — CIPROFLOXACIN HCL 500 MG PO TABS: 500.0000 mg | ORAL_TABLET | Freq: Two times a day (BID) | ORAL | Status: AC

## 2015-02-28 MED ORDER — HYDROCODONE-ACETAMINOPHEN 5-325 MG PO TABS
1.0000 | ORAL_TABLET | ORAL | Status: DC | PRN
Start: 2015-02-28 — End: 2015-07-15

## 2015-02-28 NOTE — Discharge Instructions (Signed)
Follow with Primary MD Nyoka Cowden, MD in 7 days   Get CBC, CMP, 2 view Chest X ray checked  by Primary MD next visit.    Activity: As tolerated with Full fall precautions use walker/cane & assistance as needed   Disposition snf   Diet: Heart Healthy  , with feeding assistance and aspiration precautions as needed.  For Heart failure patients - Check your Weight same time everyday, if you gain over 2 pounds, or you develop in leg swelling, experience more shortness of breath or chest pain, call your Primary MD immediately. Follow Cardiac Low Salt Diet and 1.5 lit/day fluid restriction.   On your next visit with your primary care physician please Get Medicines reviewed and adjusted.   Please request your Prim.MD to go over all Hospital Tests and Procedure/Radiological results at the follow up, please get all Hospital records sent to your Prim MD by signing hospital release before you go home.   If you experience worsening of your admission symptoms, develop shortness of breath, life threatening emergency, suicidal or homicidal thoughts you must seek medical attention immediately by calling 911 or calling your MD immediately  if symptoms less severe.  You Must read complete instructions/literature along with all the possible adverse reactions/side effects for all the Medicines you take and that have been prescribed to you. Take any new Medicines after you have completely understood and accpet all the possible adverse reactions/side effects.   Do not drive, operating heavy machinery, perform activities at heights, swimming or participation in water activities or provide baby sitting services if your were admitted for syncope or siezures until you have seen by Primary MD or a Neurologist and advised to do so again.  Do not drive when taking Pain medications.    Do not take more than prescribed Pain, Sleep and Anxiety Medications  Special Instructions: If you have smoked or  chewed Tobacco  in the last 2 yrs please stop smoking, stop any regular Alcohol  and or any Recreational drug use.  Wear Seat belts while driving.   Please note  You were cared for by a hospitalist during your hospital stay. If you have any questions about your discharge medications or the care you received while you were in the hospital after you are discharged, you can call the unit and asked to speak with the hospitalist on call if the hospitalist that took care of you is not available. Once you are discharged, your primary care physician will handle any further medical issues. Please note that NO REFILLS for any discharge medications will be authorized once you are discharged, as it is imperative that you return to your primary care physician (or establish a relationship with a primary care physician if you do not have one) for your aftercare needs so that they can reassess your need for medications and monitor your lab values.

## 2015-02-28 NOTE — Discharge Summary (Signed)
Stephanie Zhang, 54 y.o., DOB [birth year], MRN [id number]. Admission date: 02/26/2015 Discharge Date 02/28/2015 Primary MD Nyoka Cowden, MD Admitting Physician Lavina Hamman, MD   PCP or skilled nursing facility physician please follow-up on: - Check basic labs including CBC, BMP within 3 days from discharge.  Admission Diagnosis  Weakness [R53.1] Generalized abdominal pain [R10.84] Nausea, vomiting and diarrhea [R11.2, R19.7]  Discharge Diagnosis   Principal Problem:   UTI (lower urinary tract infection) Active Problems:   Acute gastroenteritis   MS (multiple sclerosis)   Diarrhea   Gastroparesis      Past Medical History  Diagnosis Date  . MS (multiple sclerosis) 1987  . Hypoglycemia   . Multiple sclerosis   . GERD (gastroesophageal reflux disease)   . Osteoporosis   . Anxiety   . Blood transfusion     "when I was born"  . Headache(784.0)     "lots"  . Chronic back pain   . Depression   . Confusion   . Short-term memory loss   . Breast cancer 10/2001    Stage II (right side)  . Gastroparesis     Past Surgical History  Procedure Laterality Date  . Breast lumpectomy  2002    right  . Breast biopsy  2002    right  . Colonoscopy N/A 04/21/2013    Procedure: COLONOSCOPY;  Surgeon: Ladene Artist, MD;  Location: [facility];  Service: Endoscopy;  Laterality: N/A;     Hospital Course See H&P, Labs, Consult and Test reports for all details in brief, patient was admitted for   Principal Problem:   UTI (lower urinary tract infection) Active Problems:   Acute gastroenteritis   MS (multiple sclerosis)   Diarrhea   Gastroparesis  Stephanie Zhang is a 54 y.o. female with Past medical history of multiple sclerosis on interferon, GERD, chronic pain, gastroparesis. presenting with complaints of nausea vomiting diarrhea as well as generalized weakness. She mentions one other resident at her living facility had some GI symptoms. Known to have history of  multiple sclerosis , generally mobilized with a wheelchair or walker with assistance. denies any new vision changes and mentions has chronic blurred vision. She denies any chest pain abdominal pain upon admission, no recurrence of nausea vomiting diarrhea during hospital stay.  UTI - Started on IV Rocephin, received total of 2 doses, to finish another 3 days of oral Cipro as an outpatient.   Gastroenteritis - Patient presents with nausea, vomiting, diarrhea, this is most likely related to gastroenteritis, she reports other residents at the facility has it. - Negative Clostridium difficile - No further nausea, vomiting or diarrhea during hospital stay.  Multiple sclerosis - Monitor closely as patient with UTI and gastroenteritis - PT/OT consulted - Resume home medication  Consults   None  Significant Tests:  See full reports for all details    Dg Abd Acute W/chest  02/26/2015   CLINICAL DATA:  Vomiting and dehydration  EXAM: ACUTE ABDOMEN SERIES (ABDOMEN 2 VIEW & CHEST 1 VIEW)  COMPARISON:  None.  FINDINGS: Lungs are clear.  No free air beneath the diaphragm.  There are gas-filled loops of small bowel measuring up to 3 cm which is upper limits of normal. No air-fluid levels. There is gas within the rectum and colon. No intraperitoneal free air on the decubitus view.  IMPRESSION: 1.  No acute cardiopulmonary process. 2. Prominent loops of small bowel upper limits of normal. No air-fluid levels. 3. No evidence of bowel obstruction  or intraperitoneal free air   Electronically Signed   By: Suzy Bouchard M.D.   On: 02/26/2015 20:17     Today   Subjective:   Stephanie Zhang today has no headache,no chest abdominal pain,no new weakness tingling or numbness, feels much better wants to go home today., no nausea or vomiting.  Objective:   Blood pressure 122/73, pulse 90, temperature 98.4 F (36.9 C), temperature source Oral, resp. rate 18, height 5\' 3"  (1.6 m), weight 54 kg (119 lb 0.8 oz),  last menstrual period 08/09/2008, SpO2 99 %.  Intake/Output Summary (Last 24 hours) at 02/28/15 1122 Last data filed at 02/28/15 1022  Gross per 24 hour  Intake   2405 ml  Output   2000 ml  Net    405 ml    Exam  Awake Alert, Oriented X 3, No new F.N deficits, Normal affect [place].AT,PERRAL Supple Neck,No JVD, No cervical lymphadenopathy appriciated.  Symmetrical Chest wall movement, Good air movement bilaterally, CTAB RRR,No Gallops,Rubs or new Murmurs, No Parasternal Heave +ve B.Sounds, Abd Soft, No tenderness, No organomegaly appriciated, No rebound - guarding or rigidity. No Cyanosis, Clubbing or edema, No new Rash or bruise   Data Review   Cultures -   CBC w Diff: Lab Results  Component Value Date   WBC 4.1 02/28/2015   HGB 10.2* 02/28/2015   HCT 31.9* 02/28/2015   PLT 113* 02/28/2015   LYMPHOPCT 23 02/27/2015   MONOPCT 9 02/27/2015   EOSPCT 1 02/27/2015   BASOPCT 0 02/27/2015   CMP: Lab Results  Component Value Date   NA 143 02/28/2015   K 3.1* 02/28/2015   CL 114* 02/28/2015   CO2 24 02/28/2015   BUN 7 02/28/2015   CREATININE 0.72 02/28/2015   PROT 5.0* 02/27/2015   ALBUMIN 2.5* 02/27/2015   BILITOT 0.8 02/27/2015   ALKPHOS 93 02/27/2015   AST 22 02/27/2015   ALT 18 02/27/2015  .  Micro Results Recent Results (from the past 240 hour(s))  Clostridium Difficile by PCR     Status: None   Collection Time: 02/28/15  8:59 AM  Result Value Ref Range Status   C difficile by pcr NEGATIVE NEGATIVE Final     Discharge Instructions      Follow-up Information    Follow up with Nyoka Cowden, MD. Schedule an appointment as soon as possible for a visit in 1 week.   Specialty:  Internal Medicine   Why:  [facility] hospital stay for UTI and gastroenteritis.   Contact information:   [place] [address] [phone]       Discharge Medications     Medication List    TAKE these medications        amantadine 100 MG capsule   Commonly known as:  SYMMETREL  Take 1 capsule (100 mg total) by mouth 2 (two) times daily.     buPROPion 75 MG tablet  Commonly known as:  WELLBUTRIN  Take 2 tablets (150 mg total) by mouth daily.     Calcium Carbonate-Vitamin D 600-400 MG-UNIT per tablet  Take 1 tablet by mouth daily.     ciprofloxacin 500 MG tablet  Commonly known as:  CIPRO  Take 1 tablet (500 mg total) by mouth 2 (two) times daily.  Start taking on:  03/01/2015     diphenhydrAMINE 25 MG tablet  Commonly known as:  BENADRYL  Take 25 mg by mouth every 6 (six) hours as needed for itching.  docusate sodium 100 MG capsule  Commonly known as:  COLACE  Take 100 mg by mouth 2 (two) times daily.     eucerin cream  Apply 1 application topically 2 (two) times daily as needed for dry skin.     guaiFENesin 100 MG/5ML liquid  Commonly known as:  ROBITUSSIN  Take 200 mg by mouth every 6 (six) hours as needed for cough.     HYDROcodone-acetaminophen 5-325 MG per tablet  Commonly known as:  NORCO/VICODIN  Take 1 tablet by mouth every 4 (four) hours as needed for moderate pain.     ibuprofen 800 MG tablet  Commonly known as:  ADVIL,MOTRIN  Take 800 mg by mouth every 8 (eight) hours as needed (pain).     interferon beta-1a 44 MCG/0.5ML injection  Commonly known as:  REBIF  Inject 44 mcg into the skin every Monday, Wednesday, and Friday.     loperamide 2 MG capsule  Commonly known as:  IMODIUM  Take 2 mg by mouth every 4 (four) hours as needed for diarrhea or loose stools.     oxybutynin 5 MG tablet  Commonly known as:  DITROPAN  Take 1 tablet (5 mg total) by mouth daily.     pantoprazole 40 MG tablet  Commonly known as:  PROTONIX  Take 40 mg by mouth daily.     polyethylene glycol packet  Commonly known as:  MIRALAX / GLYCOLAX  Take 17 g by mouth daily.     senna 8.6 MG tablet  Commonly known as:  SENOKOT  Take 1 tablet by mouth daily.         Total Time in preparing paper work, data evaluation  and todays exam - 35 minutes  Anelle Parlow M.D on 02/28/2015 at [place]  [phone]

## 2015-02-28 NOTE — Progress Notes (Signed)
NURSING PROGRESS NOTE  H[facility] [id number] Discharge Data: 02/28/2015 1:42 PM Attending Provider: Albertine Patricia, MD VVO:[id number] Pilar Plate, MD     [name] to be D/C'd Skilled nursing facility per MD order. Report called to SNF. All IV's discontinued with no bleeding noted. All belongings returned to patient for patient to take home.   Last Vital Signs:  Blood pressure 122/73, pulse 90, temperature 98.4 F (36.9 C), temperature source Oral, resp. rate 18, height 5\' 3"  (1.6 m), weight 54 kg (119 lb 0.8 oz), last menstrual period 08/09/2008, SpO2 99 %.  Discharge Medication List   Medication List    TAKE these medications        amantadine 100 MG capsule  Commonly known as:  SYMMETREL  Take 1 capsule (100 mg total) by mouth 2 (two) times daily.     buPROPion 75 MG tablet  Commonly known as:  WELLBUTRIN  Take 2 tablets (150 mg total) by mouth daily.     Calcium Carbonate-Vitamin D 600-400 MG-UNIT per tablet  Take 1 tablet by mouth daily.     ciprofloxacin 500 MG tablet  Commonly known as:  CIPRO  Take 1 tablet (500 mg total) by mouth 2 (two) times daily.  Start taking on:  03/01/2015     diphenhydrAMINE 25 MG tablet  Commonly known as:  BENADRYL  Take 25 mg by mouth every 6 (six) hours as needed for itching.     docusate sodium 100 MG capsule  Commonly known as:  COLACE  Take 100 mg by mouth 2 (two) times daily.     eucerin cream  Apply 1 application topically 2 (two) times daily as needed for dry skin.     guaiFENesin 100 MG/5ML liquid  Commonly known as:  ROBITUSSIN  Take 200 mg by mouth every 6 (six) hours as needed for cough.     HYDROcodone-acetaminophen 5-325 MG per tablet  Commonly known as:  NORCO/VICODIN  Take 1 tablet by mouth every 4 (four) hours as needed for moderate pain.     ibuprofen 800 MG tablet  Commonly known as:  ADVIL,MOTRIN  Take 800 mg by mouth every 8 (eight) hours as needed (pain).     interferon beta-1a 44 MCG/0.5ML  injection  Commonly known as:  REBIF  Inject 44 mcg into the skin every Monday, Wednesday, and Friday.     loperamide 2 MG capsule  Commonly known as:  IMODIUM  Take 2 mg by mouth every 4 (four) hours as needed for diarrhea or loose stools.     oxybutynin 5 MG tablet  Commonly known as:  DITROPAN  Take 1 tablet (5 mg total) by mouth daily.     pantoprazole 40 MG tablet  Commonly known as:  PROTONIX  Take 40 mg by mouth daily.     polyethylene glycol packet  Commonly known as:  MIRALAX / GLYCOLAX  Take 17 g by mouth daily.     senna 8.6 MG tablet  Commonly known as:  SENOKOT  Take 1 tablet by mouth daily.

## 2015-02-28 NOTE — Clinical Social Work Note (Signed)
Per MD patient ready to DC back to [facility]. RN, patient/family (sister Laurence Aly), and facility notified of patient's DC. RN given number for report. DC packet on patient's chart. Sister Laurence Aly will pick patient up at 3:30PM. Lenita instructed to give DC packet to facility. CSW signing off at this time.   Liz Beach MSW, [place], [place], [phone]

## 2015-03-01 NOTE — Progress Notes (Signed)
CARE MANAGEMENT NOTE 03/01/2015  Patient:  Stephanie Zhang, Stephanie Zhang   Account Number:  [id number]  Date Initiated:  03/01/2015  Documentation initiated by:  [facility]  Subjective/Objective Assessment:   UTI     Action/Plan:   from [facility]   Anticipated DC Date:  02/28/2015   Anticipated DC Plan:  [place]  CM consult      [facility] Choice  HOME HEALTH   Choice offered to / List presented to:  C-1 Patient        [facility] [place] arranged  HH-2 PT  HH-1 RN  HH-3 OT      [place]   Status of service:  Completed, signed off Medicare Important Message given?  NA - LOS <3 / Initial given by admissions (If response is "NO", the following Medicare IM given date fields will be blank) Date Medicare IM given:   Medicare IM given by:   Date Additional Medicare IM given:   Additional Medicare IM given by:    Discharge Disposition:  [place]  Per UR Regulation:    If discussed at Long Length of Stay Meetings, dates discussed:    Comments:  03/01/2015 1140 Pt dc to [facility] needing HH. HH arranged with [place]. Jonnie Finner RN CCM Case Mgmt phone [phone]

## 2015-03-31 ENCOUNTER — Other Ambulatory Visit: Payer: Self-pay | Admitting: *Deleted

## 2015-05-03 ENCOUNTER — Ambulatory Visit (INDEPENDENT_AMBULATORY_CARE_PROVIDER_SITE_OTHER): Payer: Medicare Other | Admitting: Internal Medicine

## 2015-05-03 ENCOUNTER — Encounter: Payer: Self-pay | Admitting: Internal Medicine

## 2015-05-03 VITALS — BP 120/80 | HR 83 | Temp 98.1°F | Resp 18 | Ht 63.0 in | Wt 120.0 lb

## 2015-05-03 DIAGNOSIS — G35 Multiple sclerosis: Secondary | ICD-10-CM

## 2015-05-03 DIAGNOSIS — R300 Dysuria: Secondary | ICD-10-CM

## 2015-05-03 DIAGNOSIS — N390 Urinary tract infection, site not specified: Secondary | ICD-10-CM

## 2015-05-03 LAB — POCT URINALYSIS DIPSTICK
Bilirubin, UA: NEGATIVE
Glucose, UA: NEGATIVE
KETONES UA: NEGATIVE
Nitrite, UA: NEGATIVE
PH UA: 7
PROTEIN UA: NEGATIVE
SPEC GRAV UA: 1.025
Urobilinogen, UA: 0.2

## 2015-05-03 MED ORDER — CIPROFLOXACIN HCL 500 MG PO TABS: 500.0000 mg | ORAL_TABLET | Freq: Two times a day (BID) | ORAL | Status: DC

## 2015-05-03 NOTE — Patient Instructions (Signed)
Drink as much fluid as you  can tolerate over the next few days  Take your antibiotic as prescribed until ALL of it is gone, but stop if you develop a rash, swelling, or any side effects of the medication.  Contact our office as soon as possible if  there are side effects of the medication.   

## 2015-05-03 NOTE — Progress Notes (Signed)
Subjective:    Patient ID: Stephanie Zhang, female    DOB: 19-Feb-1961, 54 y.o.   MRN: [id number]  HPI 54 year old patient who is seen with a 2 day history of burning dysuria and urgency.  She has had UTIs in the past.  She is followed by neurology with MS.  Her neurological status has been stable.  No fever, chills or flank pain. She was hospitalized for acute gastroenteritis.  2 months ago.  Hospital records reviewed  Past Medical History  Diagnosis Date  . MS (multiple sclerosis) 1987  . Hypoglycemia   . Multiple sclerosis   . GERD (gastroesophageal reflux disease)   . Osteoporosis   . Anxiety   . Blood transfusion     "when I was born"  . Headache(784.0)     "lots"  . Chronic back pain   . Depression   . Confusion   . Short-term memory loss   . Breast cancer 10/2001    Stage II (right side)  . Gastroparesis     History   Social History  . Marital Status: Widowed    Spouse Name: N/A  . Number of Children: N/A  . Years of Education: N/A   Occupational History  . Not on file.   Social History Main Topics  . Smoking status: Former Smoker    Quit date: 05/11/2011  . Smokeless tobacco: Never Used  . Alcohol Use: No     Comment: "last alcohol 12/04/11"  . Drug Use: No     Comment: "last drug use ~ 1980"  . Sexual Activity: No   Other Topics Concern  . Not on file   Social History Narrative    Past Surgical History  Procedure Laterality Date  . Breast lumpectomy  2002    right  . Breast biopsy  2002    right  . Colonoscopy N/A 04/21/2013    Procedure: COLONOSCOPY;  Surgeon: Ladene Artist, MD;  Location: [facility];  Service: Endoscopy;  Laterality: N/A;    Family History  Problem Relation Age of Onset  . Diabetes Mother   . Cancer Mother     cervical cancer  . Heart disease Mother   . Diabetes Father     Allergies  Allergen Reactions  . Sulfa Antibiotics Itching and Rash  . Sulfacetamide Sodium Itching and Rash    Current Outpatient  Prescriptions on File Prior to Visit  Medication Sig Dispense Refill  . amantadine (SYMMETREL) 100 MG capsule Take 1 capsule (100 mg total) by mouth 2 (two) times daily. 180 capsule 4  . buPROPion (WELLBUTRIN) 75 MG tablet Take 2 tablets (150 mg total) by mouth daily. 60 tablet 0  . Calcium Carbonate-Vitamin D 600-400 MG-UNIT per tablet Take 1 tablet by mouth daily.    . diphenhydrAMINE (BENADRYL) 25 MG tablet Take 25 mg by mouth every 6 (six) hours as needed for itching.    . docusate sodium (COLACE) 100 MG capsule Take 100 mg by mouth 2 (two) times daily.    [name] guaiFENesin (ROBITUSSIN) 100 MG/5ML liquid Take 200 mg by mouth every 6 (six) hours as needed for cough.    [name] HYDROcodone-acetaminophen (NORCO/VICODIN) 5-325 MG per tablet Take 1 tablet by mouth every 4 (four) hours as needed for moderate pain. 15 tablet 0  . ibuprofen (ADVIL,MOTRIN) 800 MG tablet Take 800 mg by mouth every 8 (eight) hours as needed (pain).     . interferon beta-1a (REBIF) 44 MCG/0.5ML injection Inject 44 mcg into the  skin every Monday, Wednesday, and Friday.     . loperamide (IMODIUM) 2 MG capsule Take 2 mg by mouth every 4 (four) hours as needed for diarrhea or loose stools.     [name] oxybutynin (DITROPAN) 5 MG tablet Take 1 tablet (5 mg total) by mouth daily. 90 tablet 4  . pantoprazole (PROTONIX) 40 MG tablet Take 40 mg by mouth daily.    . polyethylene glycol (MIRALAX / GLYCOLAX) packet Take 17 g by mouth daily. 14 each 6  . senna (SENOKOT) 8.6 MG tablet Take 1 tablet by mouth daily.    . Skin Protectants, Misc. (EUCERIN) cream Apply 1 application topically 2 (two) times daily as needed for dry skin.     No current facility-administered medications on file prior to visit.    BP 120/80 mmHg  Pulse 83  Temp(Src) 98.1 F (36.7 C) (Oral)  Resp 18  Ht 5\' 3"  (1.6 m)  Wt 120 lb (54.432 kg)  BMI 21.26 kg/m2  SpO2 96%  LMP 08/09/2008      Review of Systems  Constitutional: Positive for fever. Negative for chills.   HENT: Negative for congestion, dental problem, hearing loss, rhinorrhea, sinus pressure, sore throat and tinnitus.   Eyes: Negative for pain, discharge and visual disturbance.  Respiratory: Negative for cough and shortness of breath.   Cardiovascular: Negative for chest pain, palpitations and leg swelling.  Gastrointestinal: Negative for nausea, vomiting, abdominal pain, diarrhea, constipation, blood in stool and abdominal distention.  Genitourinary: Positive for dysuria, urgency and difficulty urinating. Negative for frequency, hematuria, flank pain, vaginal bleeding, vaginal discharge, vaginal pain and pelvic pain.  Musculoskeletal: Negative for joint swelling, arthralgias and gait problem.  Skin: Negative for rash.  Neurological: Negative for dizziness, syncope, speech difficulty, weakness, numbness and headaches.  Hematological: Negative for adenopathy.  Psychiatric/Behavioral: Negative for behavioral problems, dysphoric mood and agitation. The patient is not nervous/anxious.        Objective:   Physical Exam  Constitutional: She is oriented to person, place, and time. She appears well-developed and well-nourished.  HENT:  Head: Normocephalic.  Right Ear: External ear normal.  Left Ear: External ear normal.  Mouth/Throat: Oropharynx is clear and moist.  Eyes: Conjunctivae and EOM are normal. Pupils are equal, round, and reactive to light.  Neck: Normal range of motion. Neck supple. No thyromegaly present.  Cardiovascular: Normal rate, regular rhythm, normal heart sounds and intact distal pulses.   Pulmonary/Chest: Effort normal and breath sounds normal.  Abdominal: Soft. Bowel sounds are normal. She exhibits no mass. There is no tenderness.  Musculoskeletal: Normal range of motion.  Lymphadenopathy:    She has no cervical adenopathy.  Neurological: She is alert and oriented to person, place, and time.  Skin: Skin is warm and dry. No rash noted.  Psychiatric: She has a normal  mood and affect. Her behavior is normal.          Assessment & Plan:   Acute UTI.  Will treat with Cipro for 3 days MS stable History of viral gastroenteritis

## 2015-05-03 NOTE — Progress Notes (Signed)
Pre visit review using our clinic review tool, if applicable. No additional management support is needed unless otherwise documented below in the visit note. 

## 2015-07-15 ENCOUNTER — Other Ambulatory Visit: Payer: Self-pay | Admitting: Internal Medicine

## 2015-07-15 MED ORDER — HYDROCODONE-ACETAMINOPHEN 5-325 MG PO TABS
1.0000 | ORAL_TABLET | ORAL | Status: DC | PRN
Start: 2015-07-15 — End: 2015-07-18

## 2015-07-18 ENCOUNTER — Other Ambulatory Visit: Payer: Self-pay | Admitting: *Deleted

## 2015-07-18 ENCOUNTER — Telehealth: Payer: Self-pay | Admitting: Internal Medicine

## 2015-07-18 MED ORDER — HYDROCODONE-ACETAMINOPHEN 5-325 MG PO TABS: 1.0000 | ORAL_TABLET | ORAL | Status: DC | PRN

## 2015-07-18 NOTE — Telephone Encounter (Signed)
[name] with [facility] called and reported that one of her nurses had needle stick today.  Want order to check HIV and hepatitis test on Stephanie Zhang - per their protocol.  I ok'd order.  They will discuss with pt and get ok from her as well.

## 2015-07-18 NOTE — Telephone Encounter (Signed)
Rx for Hydrocodone faxed to [facility].

## 2015-09-06 ENCOUNTER — Encounter: Payer: Self-pay | Admitting: Adult Health

## 2015-09-06 ENCOUNTER — Ambulatory Visit (INDEPENDENT_AMBULATORY_CARE_PROVIDER_SITE_OTHER): Payer: Medicare Other | Admitting: Adult Health

## 2015-09-06 VITALS — BP 134/78 | HR 103 | Temp 99.1°F | Ht 63.0 in | Wt 124.3 lb

## 2015-09-06 DIAGNOSIS — K529 Noninfective gastroenteritis and colitis, unspecified: Secondary | ICD-10-CM | POA: Diagnosis not present

## 2015-09-06 MED ORDER — ONDANSETRON HCL 4 MG PO TABS: 4.0000 mg | ORAL_TABLET | Freq: Three times a day (TID) | ORAL | Status: DC | PRN

## 2015-09-06 NOTE — Progress Notes (Addendum)
   Subjective:    Patient ID: Stephanie Zhang, female    DOB: 21-May-1961, 54 y.o.   MRN: [id number]  HPI  54 year old female who presents to the office today for less than 24 hours of "feeling sick". After she had breakfast yesterday morning she had one episode of diarrhea and felt "terrible" all day. She has not had a bowel movement since.She lives at [facility] because of MS.    She reports being nauseated. She has not vomited. Denies fever. No abx use   Review of Systems  Gastrointestinal: Positive for nausea and diarrhea. Negative for vomiting, abdominal pain, constipation, blood in stool, abdominal distention and anal bleeding.  Musculoskeletal: Negative.        Objective:   Physical Exam  Constitutional: She is oriented to person, place, and time. She appears well-developed and well-nourished. No distress.  Cardiovascular: Normal rate, regular rhythm, normal heart sounds and intact distal pulses.  Exam reveals no gallop and no friction rub.   No murmur heard. Pulmonary/Chest: Effort normal and breath sounds normal. No respiratory distress. She has no wheezes. She has no rales. She exhibits no tenderness.  Abdominal: Soft. Bowel sounds are normal. She exhibits no distension and no mass. There is no tenderness. There is no rebound and no guarding.  Musculoskeletal: Normal range of motion. She exhibits no edema or tenderness.  Neurological: She is alert and oriented to person, place, and time.  Skin: Skin is warm and dry. No rash noted. She is not diaphoretic. No erythema. No pallor.  Psychiatric: She has a normal mood and affect. Her behavior is normal. Judgment and thought content normal.  Nursing note and vitals reviewed.      Assessment & Plan:  1. Gastroenteritis - ondansetron (ZOFRAN) 4 MG tablet; Take 1 tablet (4 mg total) by mouth every 8 (eight) hours as needed for nausea or vomiting.  Dispense: 20 tablet; Refill: 0 - BRAT diet -Stay hydrated -  Follow up if no improvement in 2-3 days or if symptoms worsen

## 2015-09-06 NOTE — Progress Notes (Signed)
Pre visit review using our clinic review tool, if applicable. No additional management support is needed unless otherwise documented below in the visit note. 

## 2015-09-06 NOTE — Patient Instructions (Addendum)
Stay hydrated the best as you can.   Let your stomach rest and eat a BRAT diet for the next three days.   Take the Zofran every 6-8 hours as needed    Viral Gastroenteritis Viral gastroenteritis is also known as stomach flu. This condition affects the stomach and intestinal tract. It can cause sudden diarrhea and vomiting. The illness typically lasts 3 to 8 days. Most people develop an immune response that eventually gets rid of the virus. While this natural response develops, the virus can make you quite ill. CAUSES  Many different viruses can cause gastroenteritis, such as rotavirus or noroviruses. You can catch one of these viruses by consuming contaminated food or water. You may also catch a virus by sharing utensils or other personal items with an infected person or by touching a contaminated surface. SYMPTOMS  The most common symptoms are diarrhea and vomiting. These problems can cause a severe loss of body fluids (dehydration) and a body salt (electrolyte) imbalance. Other symptoms may include:  Fever.  Headache.  Fatigue.  Abdominal pain. DIAGNOSIS  Your caregiver can usually diagnose viral gastroenteritis based on your symptoms and a physical exam. A stool sample may also be taken to test for the presence of viruses or other infections. TREATMENT  This illness typically goes away on its own. Treatments are aimed at rehydration. The most serious cases of viral gastroenteritis involve vomiting so severely that you are not able to keep fluids down. In these cases, fluids must be given through an intravenous line (IV). HOME CARE INSTRUCTIONS   Drink enough fluids to keep your urine clear or pale yellow. Drink small amounts of fluids frequently and increase the amounts as tolerated.  Ask your caregiver for specific rehydration instructions.  Avoid:  Foods high in sugar.  Alcohol.  Carbonated drinks.  Tobacco.  Juice.  Caffeine drinks.  Extremely hot or cold  fluids.  Fatty, greasy foods.  Too much intake of anything at one time.  Dairy products until 24 to 48 hours after diarrhea stops.  You may consume probiotics. Probiotics are active cultures of beneficial bacteria. They may lessen the amount and number of diarrheal stools in adults. Probiotics can be found in yogurt with active cultures and in supplements.  Wash your hands well to avoid spreading the virus.  Only take over-the-counter or prescription medicines for pain, discomfort, or fever as directed by your caregiver. Do not give aspirin to children. Antidiarrheal medicines are not recommended.  Ask your caregiver if you should continue to take your regular prescribed and over-the-counter medicines.  Keep all follow-up appointments as directed by your caregiver. SEEK IMMEDIATE MEDICAL CARE IF:   You are unable to keep fluids down.  You do not urinate at least once every 6 to 8 hours.  You develop shortness of breath.  You notice blood in your stool or vomit. This may look like coffee grounds.  You have abdominal pain that increases or is concentrated in one small area (localized).  You have persistent vomiting or diarrhea.  You have a fever.  The patient is a child younger than 3 months, and he or she has a fever.  The patient is a child older than 3 months, and he or she has a fever and persistent symptoms.  The patient is a child older than 3 months, and he or she has a fever and symptoms suddenly get worse.  The patient is a baby, and he or she has no tears when crying. MAKE  SURE YOU:   Understand these instructions.  Will watch your condition.  Will get help right away if you are not doing well or get worse. Document Released: 11/26/2005 Document Revised: 02/18/2012 Document Reviewed: 09/12/2011 [facility] Patient Information 2015 [place], [place]. This information is not intended to replace advice given to you by your health care provider. Make sure you discuss  any questions you have with your health care provider.

## 2015-09-20 ENCOUNTER — Telehealth: Payer: Self-pay | Admitting: Internal Medicine

## 2015-09-20 NOTE — Telephone Encounter (Signed)
Pt's ondansetron (ZOFRAN) 4 MG tablet has been denied. Pt has been made aware and a letter will go out.

## 2015-09-20 NOTE — Telephone Encounter (Signed)
FYI

## 2015-09-21 NOTE — Telephone Encounter (Signed)
Noted  

## 2016-03-21 ENCOUNTER — Telehealth: Payer: Self-pay | Admitting: Internal Medicine

## 2016-03-21 NOTE — Telephone Encounter (Signed)
Pt need to have a FL2 form filled out should the patient make a appt?  Please call

## 2016-03-22 NOTE — Telephone Encounter (Signed)
Pt scheduled  

## 2016-03-22 NOTE — Telephone Encounter (Signed)
Please call pt and schedule Annual Exam and have them bring paperwork with them to the  Appointment. Thanks.

## 2016-03-27 ENCOUNTER — Other Ambulatory Visit (INDEPENDENT_AMBULATORY_CARE_PROVIDER_SITE_OTHER): Payer: Medicare Other

## 2016-03-27 DIAGNOSIS — Z1159 Encounter for screening for other viral diseases: Secondary | ICD-10-CM | POA: Diagnosis not present

## 2016-03-27 DIAGNOSIS — Z0000 Encounter for general adult medical examination without abnormal findings: Secondary | ICD-10-CM | POA: Diagnosis not present

## 2016-03-27 LAB — CBC WITH DIFFERENTIAL/PLATELET
BASOS ABS: 0 10*3/uL (ref 0.0–0.1)
Basophils Relative: 0.6 % (ref 0.0–3.0)
Eosinophils Absolute: 0 10*3/uL (ref 0.0–0.7)
Eosinophils Relative: 0 % (ref 0.0–5.0)
HCT: 36.3 % (ref 36.0–46.0)
Hemoglobin: 12.2 g/dL (ref 12.0–15.0)
LYMPHS ABS: 1.2 10*3/uL (ref 0.7–4.0)
Lymphocytes Relative: 25.8 % (ref 12.0–46.0)
MCHC: 33.8 g/dL (ref 30.0–36.0)
MCV: 85.4 fl (ref 78.0–100.0)
Monocytes Absolute: 0.6 10*3/uL (ref 0.1–1.0)
Monocytes Relative: 13.3 % — ABNORMAL HIGH (ref 3.0–12.0)
Neutro Abs: 2.7 10*3/uL (ref 1.4–7.7)
Neutrophils Relative %: 60.3 % (ref 43.0–77.0)
PLATELETS: 173 10*3/uL (ref 150.0–400.0)
RBC: 4.24 Mil/uL (ref 3.87–5.11)
RDW: 15.4 % (ref 11.5–15.5)
WBC: 4.5 10*3/uL (ref 4.0–10.5)

## 2016-03-27 LAB — POC URINALSYSI DIPSTICK (AUTOMATED)
Bilirubin, UA: NEGATIVE
Glucose, UA: NEGATIVE
Ketones, UA: NEGATIVE
NITRITE UA: NEGATIVE
Protein, UA: NEGATIVE
RBC UA: NEGATIVE
Spec Grav, UA: 1.02
UROBILINOGEN UA: 0.2
pH, UA: 7

## 2016-03-27 LAB — TSH: TSH: 3.34 u[IU]/mL (ref 0.35–4.50)

## 2016-03-27 LAB — HEPATIC FUNCTION PANEL
ALBUMIN: 3.6 g/dL (ref 3.5–5.2)
ALK PHOS: 120 U/L — AB (ref 39–117)
ALT: 13 U/L (ref 0–35)
AST: 16 U/L (ref 0–37)
Bilirubin, Direct: 0.1 mg/dL (ref 0.0–0.3)
Total Bilirubin: 0.6 mg/dL (ref 0.2–1.2)
Total Protein: 6.5 g/dL (ref 6.0–8.3)

## 2016-03-27 LAB — LIPID PANEL
CHOLESTEROL: 156 mg/dL (ref 0–200)
HDL: 63.6 mg/dL (ref >39.00)
LDL Cholesterol: 78 mg/dL (ref 0–99)
NonHDL: 92.71
Total CHOL/HDL Ratio: 2
Triglycerides: 76 mg/dL (ref 0.0–149.0)
VLDL: 15.2 mg/dL (ref 0.0–40.0)

## 2016-03-27 LAB — BASIC METABOLIC PANEL
BUN: 9 mg/dL (ref 6–23)
CHLORIDE: 108 meq/L (ref 96–112)
CO2: 28 mEq/L (ref 19–32)
Calcium: 9.1 mg/dL (ref 8.4–10.5)
Creatinine, Ser: 0.83 mg/dL (ref 0.40–1.20)
GFR: 76.04 mL/min (ref >60.00)
GLUCOSE: 90 mg/dL (ref 70–99)
POTASSIUM: 3.5 meq/L (ref 3.5–5.1)
SODIUM: 142 meq/L (ref 135–145)

## 2016-03-28 LAB — HEPATITIS C ANTIBODY: HCV Ab: NEGATIVE

## 2016-03-28 LAB — HIV ANTIBODY (ROUTINE TESTING W REFLEX): HIV 1&2 Ab, 4th Generation: NONREACTIVE

## 2016-04-13 ENCOUNTER — Encounter: Payer: Self-pay | Admitting: Internal Medicine

## 2016-04-13 ENCOUNTER — Ambulatory Visit (INDEPENDENT_AMBULATORY_CARE_PROVIDER_SITE_OTHER): Payer: Medicare Other | Admitting: Internal Medicine

## 2016-04-13 VITALS — BP 140/80 | HR 98 | Temp 98.8°F | Resp 18 | Ht 64.5 in | Wt 123.0 lb

## 2016-04-13 DIAGNOSIS — Z0000 Encounter for general adult medical examination without abnormal findings: Secondary | ICD-10-CM | POA: Diagnosis not present

## 2016-04-13 DIAGNOSIS — Z853 Personal history of malignant neoplasm of breast: Secondary | ICD-10-CM

## 2016-04-13 DIAGNOSIS — N390 Urinary tract infection, site not specified: Secondary | ICD-10-CM

## 2016-04-13 DIAGNOSIS — G35 Multiple sclerosis: Secondary | ICD-10-CM | POA: Diagnosis not present

## 2016-04-13 DIAGNOSIS — R269 Unspecified abnormalities of gait and mobility: Secondary | ICD-10-CM

## 2016-04-13 NOTE — Progress Notes (Signed)
Subjective:    Patient ID: Stephanie Zhang, female    DOB: [birth year], 55 y.o.   MRN: [id number]  HPI  55 year old patient who is seen today for a preventive health examination.  She has a long history of MS and has been disabled for a number of years.  She continues to reside at [facility] but apparently will be transferring due to a skilled nursing facility due to financial considerations.  She will soon be on Medicaid She is followed by neurology.  Annually for her chronic stable MS she remains on interferon 3 times weekly and has been stable for some time.  She is able 3 with a walker but has significant upper and lower extremity ataxia  She has remote history of breast cancer in 2002 and was released by oncology about one year ago.  She is overdue for a mammogram  Screening colonoscopy was normal in May 2014  She was last hospitalized in March 2016 with dehydration and UTI.  Doing quite well.  [facility] [place] by her mother today  Past Medical History  Diagnosis Date  . MS (multiple sclerosis) ([place]) 1987  . Hypoglycemia   . Multiple sclerosis   . GERD (gastroesophageal reflux disease)   . Osteoporosis   . Anxiety   . Blood transfusion     "when I was born"  . Headache(784.0)     "lots"  . Chronic back pain   . Depression   . Confusion   . Short-term memory loss   . Breast cancer ([place]) 10/2001    Stage II (right side)  . Gastroparesis      Social History   Social History  . Marital Status: Widowed    Spouse Name: N/A  . Number of Children: N/A  . Years of Education: N/A   Occupational History  . Not on file.   Social History Main Topics  . Smoking status: Former Smoker    Quit date: 05/11/2011  . Smokeless tobacco: Never Used  . Alcohol Use: No     Comment: "last alcohol 12/04/11"  . Drug Use: No     Comment: "last drug use ~ 1980"  . Sexual Activity: No   Other Topics Concern  . Not on file   Social History Narrative    Past Surgical History    Procedure Laterality Date  . Breast lumpectomy  2002    right  . Breast biopsy  2002    right  . Colonoscopy N/A 04/21/2013    Procedure: COLONOSCOPY;  Surgeon: Ladene Artist, MD;  Location: [facility];  Service: Endoscopy;  Laterality: N/A;    Family History  Problem Relation Age of Onset  . Diabetes Mother   . Cancer Mother     cervical cancer  . Heart disease Mother   . Diabetes Father     Allergies  Allergen Reactions  . Sulfa Antibiotics Itching and Rash  . Sulfacetamide Sodium Itching and Rash    Current Outpatient Prescriptions on File Prior to Visit  Medication Sig Dispense Refill  . amantadine (SYMMETREL) 100 MG capsule Take 1 capsule (100 mg total) by mouth 2 (two) times daily. 180 capsule 4  . buPROPion (WELLBUTRIN) 75 MG tablet Take 2 tablets (150 mg total) by mouth daily. 60 tablet 0  . Calcium Carbonate-Vitamin D 600-400 MG-UNIT per tablet Take 1 tablet by mouth daily.    [name] docusate sodium (COLACE) 100 MG capsule Take 100 mg by mouth 2 (two) times daily.    [name]  guaiFENesin (ROBITUSSIN) 100 MG/5ML liquid Take 200 mg by mouth every 6 (six) hours as needed for cough.    . interferon beta-1a (REBIF) 44 MCG/0.5ML injection Inject 44 mcg into the skin every Monday, Wednesday, and Friday.     . loperamide (IMODIUM) 2 MG capsule Take 2 mg by mouth every 4 (four) hours as needed for diarrhea or loose stools.     . ondansetron (ZOFRAN) 4 MG tablet Take 1 tablet (4 mg total) by mouth every 8 (eight) hours as needed for nausea or vomiting. 20 tablet 0  . oxybutynin (DITROPAN) 5 MG tablet Take 1 tablet (5 mg total) by mouth daily. 90 tablet 4  . polyethylene glycol (MIRALAX / GLYCOLAX) packet Take 17 g by mouth daily. 14 each 6  . senna (SENOKOT) 8.6 MG tablet Take 1 tablet by mouth daily.    . Skin Protectants, Misc. (EUCERIN) cream Apply 1 application topically 2 (two) times daily as needed for dry skin.     No current facility-administered medications on file prior to  visit.    BP 140/80 mmHg  Pulse 98  Temp(Src) 98.8 F (37.1 C) (Oral)  Resp 18  Ht 5' 4.5" (1.638 m)  Wt 123 lb (55.792 kg)  BMI 20.79 kg/m2  SpO2 99%  LMP 08/09/2008   Medicare wellness visit  1. Risk factors, based on past  M,S,F history.  No cardiovascular risk factors  2.  Physical activities: Limited due to her MS and ataxia, ambulatory with a walker  3.  Depression/mood: No history of major depression  4.  Hearing: No deficits  5.  ADL's: Presently resides in a assisted living facility.  Requires assistance in all aspects of daily living  6.  Fall risk: High due to ataxia.  Requires a walker and assistance  7.  Home safety: Resides in a assisted living facility.  No problems identified  8.  Height weight, and visual acuity; height and weight stable no change in visual acuity.  Has had a history of malnutrition, but her weight has been stable  9.  Counseling: Needs follow-up mammogram  10. Lab orders based on risk factors: Laboratory studies reviewed  11. Referral : Follow-up neurology  12. Care plan: Heart healthy diet.  Annual mammograms  13. Cognitive assessment: Alert and oriented with normal affect no cognitive dysfunction  14. Screening: Patient provided with a written and personalized 5-10 year screening schedule in the AVS.  patient will have colonoscopies at 10 year intervals.  Annual clinical exams and follow-up neurology at least annually.  Annual mammograms.  Encouraged  15. Provider List Update: Primary care neurology and radiology    Review of Systems  Constitutional: Negative.   HENT: Negative for congestion, dental problem, hearing loss, rhinorrhea, sinus pressure, sore throat and tinnitus.   Eyes: Negative for pain, discharge and visual disturbance.  Respiratory: Negative for cough and shortness of breath.   Cardiovascular: Negative for chest pain, palpitations and leg swelling.  Gastrointestinal: Negative for nausea, vomiting, abdominal  pain, diarrhea, constipation, blood in stool and abdominal distention.  Genitourinary: Negative for dysuria, urgency, frequency, hematuria, flank pain, vaginal bleeding, vaginal discharge, difficulty urinating, vaginal pain and pelvic pain.  Musculoskeletal: Positive for gait problem. Negative for joint swelling and arthralgias.  Skin: Negative for rash.  Neurological: Negative for dizziness, syncope, speech difficulty, weakness, numbness and headaches.  Hematological: Negative for adenopathy.  Psychiatric/Behavioral: Negative for behavioral problems, dysphoric mood and agitation. The patient is not nervous/anxious.  Objective:   Physical Exam  Constitutional: She is oriented to person, place, and time. She appears well-developed and well-nourished.  HENT:  Head: Normocephalic and atraumatic.  Right Ear: External ear normal.  Left Ear: External ear normal.  Mouth/Throat: Oropharynx is clear and moist.  Eyes: Conjunctivae and EOM are normal.  Neck: Normal range of motion. Neck supple. No JVD present. No thyromegaly present.  Cardiovascular: Normal rate, regular rhythm, normal heart sounds and intact distal pulses.   No murmur heard. Pulmonary/Chest: Effort normal and breath sounds normal. She has no wheezes. She has no rales.  Abdominal: Soft. Bowel sounds are normal. She exhibits no distension and no mass. There is no tenderness. There is no rebound and no guarding.  Musculoskeletal: Normal range of motion. She exhibits no edema or tenderness.  Neurological: She is alert and oriented to person, place, and time. She has normal reflexes. No cranial nerve deficit. She exhibits normal muscle tone. Coordination normal.  Ataxia upper and lower extremities Reflexes Lower extremities, absent  Sensation intact  Skin: Skin is warm and dry. No rash noted.  Psychiatric: She has a normal mood and affect. Her behavior is normal.          Assessment & Plan:   Preventive health  exam MS Gait abnormality due to ataxia History of weight loss.  Stable History of breast cancer.  Mammogram encouraged  Laboratory studies reviewed Neurology follow-up in August as scheduled  FL 2.  Forms completed  Return as needed or in one year

## 2016-04-13 NOTE — Patient Instructions (Signed)
Heart-Healthy Eating Plan °Many factors influence your heart health, including eating and exercise habits. Heart (coronary) risk increases with abnormal blood fat (lipid) levels. Heart-healthy meal planning includes limiting unhealthy fats, increasing healthy fats, and making other small dietary changes. This includes maintaining a healthy body weight to help keep lipid levels within a normal range. °WHAT IS MY PLAN?  °Your health care provider recommends that you: °· Get no more than _________% of the total calories in your daily diet from fat. °· Limit your intake of saturated fat to less than _________% of your total calories each day. °· Limit the amount of cholesterol in your diet to less than _________ mg per day. °WHAT TYPES OF FAT SHOULD I CHOOSE? °· Choose healthy fats more often. Choose monounsaturated and polyunsaturated fats, such as olive oil and canola oil, flaxseeds, walnuts, almonds, and seeds. °· Eat more omega-3 fats. Good choices include salmon, mackerel, sardines, tuna, flaxseed oil, and ground flaxseeds. Aim to eat fish at least two times each week. °· Limit saturated fats. Saturated fats are primarily found in animal products, such as meats, butter, and cream. Plant sources of saturated fats include palm oil, palm kernel oil, and coconut oil. °· Avoid foods with partially hydrogenated oils in them. These contain trans fats. Examples of foods that contain trans fats are stick margarine, some tub margarines, cookies, crackers, and other baked goods. °WHAT GENERAL GUIDELINES DO I NEED TO FOLLOW? °· Check food labels carefully to identify foods with trans fats or high amounts of saturated fat. °· Fill one half of your plate with vegetables and green salads. Eat 4-5 servings of vegetables per day. A serving of vegetables equals 1 cup of raw leafy vegetables, ½ cup of raw or cooked cut-up vegetables, or ½ cup of vegetable juice. °· Fill one fourth of your plate with whole grains. Look for the word  "whole" as the first word in the ingredient list. °· Fill one fourth of your plate with lean protein foods. °· Eat 4-5 servings of fruit per day. A serving of fruit equals one medium whole fruit, ¼ cup of dried fruit, ½ cup of fresh, frozen, or canned fruit, or ½ cup of 100% fruit juice. °· Eat more foods that contain soluble fiber. Examples of foods that contain this type of fiber are apples, broccoli, carrots, beans, peas, and barley. Aim to get 20-30 g of fiber per day. °· Eat more home-cooked food and less restaurant, buffet, and fast food. °· Limit or avoid alcohol. °· Limit foods that are high in starch and sugar. °· Avoid fried foods. °· Cook foods by using methods other than frying. Baking, boiling, grilling, and broiling are all great options. Other fat-reducing suggestions include: °¨ Removing the skin from poultry. °¨ Removing all visible fats from meats. °¨ Skimming the fat off of stews, soups, and gravies before serving them. °¨ Steaming vegetables in water or broth. °· Lose weight if you are overweight. Losing just 5-10% of your initial body weight can help your overall health and prevent diseases such as diabetes and heart disease. °· Increase your consumption of nuts, legumes, and seeds to 4-5 servings per week. One serving of dried beans or legumes equals ½ cup after being cooked, one serving of nuts equals 1½ ounces, and one serving of seeds equals ½ ounce or 1 tablespoon. °· You may need to monitor your salt (sodium) intake, especially if you have high blood pressure. Talk with your health care provider or dietitian to get   more information about reducing sodium. °WHAT FOODS CAN I EAT? °Grains °Breads, including French, white, pita, wheat, raisin, rye, oatmeal, and Italian. Tortillas that are neither fried nor made with lard or trans fat. Low-fat rolls, including hotdog and hamburger buns and English muffins. Biscuits. Muffins. Waffles. Pancakes. Light popcorn. Whole-grain cereals. Flatbread. Melba  toast. Pretzels. Breadsticks. Rusks. Low-fat snacks and crackers, including oyster, saltine, matzo, graham, animal, and rye. Rice and pasta, including brown rice and those that are made with whole wheat. °Vegetables °All vegetables. °Fruits °All fruits, but limit coconut. °Meats and Other Protein Sources °Lean, well-trimmed beef, veal, pork, and lamb. Chicken and turkey without skin. All fish and shellfish. Wild duck, rabbit, pheasant, and venison. Egg whites or low-cholesterol egg substitutes. Dried beans, peas, lentils, and tofu. Seeds and most nuts. °Dairy °Low-fat or nonfat cheeses, including ricotta, string, and mozzarella. Skim or 1% milk that is liquid, powdered, or evaporated. Buttermilk that is made with low-fat milk. Nonfat or low-fat yogurt. °Beverages °Mineral water. Diet carbonated beverages. °Sweets and Desserts °Sherbets and fruit ices. Honey, jam, marmalade, jelly, and syrups. Meringues and gelatins. Pure sugar candy, such as hard candy, jelly beans, gumdrops, mints, marshmallows, and small amounts of dark chocolate. Angel food cake. °Eat all sweets and desserts in moderation. °Fats and Oils °Nonhydrogenated (trans-free) margarines. Vegetable oils, including soybean, sesame, sunflower, olive, peanut, safflower, corn, canola, and cottonseed. Salad dressings or mayonnaise that are made with a vegetable oil. Limit added fats and oils that you use for cooking, baking, salads, and as spreads. °Other °Cocoa powder. Coffee and tea. All seasonings and condiments. °The items listed above may not be a complete list of recommended foods or beverages. Contact your dietitian for more options. °WHAT FOODS ARE NOT RECOMMENDED? °Grains °Breads that are made with saturated or trans fats, oils, or whole milk. Croissants. Butter rolls. Cheese breads. Sweet rolls. Donuts. Buttered popcorn. Chow mein noodles. High-fat crackers, such as cheese or butter crackers. °Meats and Other Protein Sources °Fatty meats, such as  hotdogs, short ribs, sausage, spareribs, bacon, ribeye roast or steak, and mutton. High-fat deli meats, such as salami and bologna. Caviar. Domestic duck and goose. Organ meats, such as kidney, liver, sweetbreads, brains, gizzard, chitterlings, and heart. °Dairy °Cream, sour cream, cream cheese, and creamed cottage cheese. Whole milk cheeses, including blue (bleu), Monterey Jack, Brie, [place], American, Havarti, Swiss, cheddar, Camembert, and [place].  Whole or 2% milk that is liquid, evaporated, or condensed. Whole buttermilk. Cream sauce or high-fat cheese sauce. Yogurt that is made from whole milk. °Beverages °Regular sodas and drinks with added sugar. °Sweets and Desserts °Frosting. Pudding. Cookies. Cakes other than angel food cake. Candy that has milk chocolate or white chocolate, hydrogenated fat, butter, coconut, or unknown ingredients. Buttered syrups. Full-fat ice cream or ice cream drinks. °Fats and Oils °Gravy that has suet, meat fat, or shortening. Cocoa butter, hydrogenated oils, palm oil, coconut oil, palm kernel oil. These can often be found in baked products, candy, fried foods, nondairy creamers, and whipped toppings. Solid fats and shortenings, including bacon fat, salt pork, lard, and butter. Nondairy cream substitutes, such as coffee creamers and sour cream substitutes. Salad dressings that are made of unknown oils, cheese, or sour cream. °The items listed above may not be a complete list of foods and beverages to avoid. Contact your dietitian for more information. °  °This information is not intended to replace advice given to you by your health care provider. Make sure you discuss any questions you have with your health   care provider.   Document Released: 09/04/2008 Document Revised: 12/17/2014 Document Reviewed: 05/20/2014 Elsevier Interactive Patient Education [employer].   Schedule your mammogram.  Neurology follow-up as scheduled  Return here in one year or as needed

## 2016-04-13 NOTE — Progress Notes (Signed)
Pre visit review using our clinic review tool, if applicable. No additional management support is needed unless otherwise documented below in the visit note. 

## 2016-08-28 DIAGNOSIS — Z87891 Personal history of nicotine dependence: Secondary | ICD-10-CM | POA: Diagnosis not present

## 2016-08-28 DIAGNOSIS — G35 Multiple sclerosis: Secondary | ICD-10-CM | POA: Diagnosis not present

## 2016-08-28 DIAGNOSIS — Z882 Allergy status to sulfonamides status: Secondary | ICD-10-CM | POA: Diagnosis not present

## 2016-08-28 DIAGNOSIS — Z79899 Other long term (current) drug therapy: Secondary | ICD-10-CM | POA: Diagnosis not present

## 2016-09-08 DIAGNOSIS — G35 Multiple sclerosis: Secondary | ICD-10-CM | POA: Diagnosis not present

## 2016-09-10 DIAGNOSIS — Z23 Encounter for immunization: Secondary | ICD-10-CM | POA: Diagnosis not present

## 2016-09-28 ENCOUNTER — Ambulatory Visit (INDEPENDENT_AMBULATORY_CARE_PROVIDER_SITE_OTHER): Payer: Medicare Other | Admitting: Internal Medicine

## 2016-09-28 ENCOUNTER — Encounter: Payer: Self-pay | Admitting: Internal Medicine

## 2016-09-28 VITALS — BP 144/98 | HR 100 | Temp 98.3°F | Resp 18 | Ht 64.5 in | Wt 121.4 lb

## 2016-09-28 DIAGNOSIS — R269 Unspecified abnormalities of gait and mobility: Secondary | ICD-10-CM | POA: Diagnosis not present

## 2016-09-28 DIAGNOSIS — Z853 Personal history of malignant neoplasm of breast: Secondary | ICD-10-CM | POA: Diagnosis not present

## 2016-09-28 DIAGNOSIS — G35 Multiple sclerosis: Secondary | ICD-10-CM

## 2016-09-28 NOTE — Progress Notes (Signed)
Subjective:    Patient ID: Stephanie Zhang, female    DOB: [birth year], 55 y.o.   MRN: [id number]  HPI 55 year old patient who has a long history of relapsing/remitting MS who has been followed by neurology at [facility].  Her neurologist is recently deceased and the patient and family is requesting follow-up with local neurology. The patient has recently had a follow-up brain MRI that revealed a dural based enhancing lesion in the left frontal area.  In 2013, the lesion was 2 x 10 mm and this has increased to 5 x 11 mm. The patient has a prior history of breast cancer and had been followed locally by Dr. Benay Spice. The patient has been scheduled to see neurosurgery Arlan Organ) and oncology Bridgett Larsson) at [facility].  Again, the patient desires local follow-up.  The patient is accompanied by 2 sisters and resides at [facility]  [facility] Readings from Last 3 Encounters:  09/28/16 121 lb 6.1 oz (55.1 kg)  04/13/16 123 lb (55.8 kg)  09/06/15 124 lb 4.8 oz (56.4 kg)    Past Medical History:  Diagnosis Date  . Anxiety   . Blood transfusion    "when I was born"  . Breast cancer ([place]) 10/2001   Stage II (right side)  . Chronic back pain   . Confusion   . Depression   . Gastroparesis   . GERD (gastroesophageal reflux disease)   . Headache(784.0)    "lots"  . Hypoglycemia   . MS (multiple sclerosis) ([place]) 1987  . Multiple sclerosis   . Osteoporosis   . Short-term memory loss      Social History   Social History  . Marital status: Widowed    Spouse name: N/A  . Number of children: N/A  . Years of education: N/A   Occupational History  . Not on file.   Social History Main Topics  . Smoking status: Former Smoker    Quit date: 05/11/2011  . Smokeless tobacco: Never Used  . Alcohol use No     Comment: "last alcohol 12/04/11"  . Drug use: No     Comment: "last drug use ~ 1980"  . Sexual activity: No   Other Topics Concern  . Not on  file   Social History Narrative  . No narrative on file    Past Surgical History:  Procedure Laterality Date  . BREAST BIOPSY  2002   right  . BREAST LUMPECTOMY  2002   right  . COLONOSCOPY N/A 04/21/2013   Procedure: COLONOSCOPY;  Surgeon: Ladene Artist, MD;  Location: [facility];  Service: Endoscopy;  Laterality: N/A;    Family History  Problem Relation Age of Onset  . Diabetes Mother   . Cancer Mother     cervical cancer  . Heart disease Mother   . Diabetes Father     Allergies  Allergen Reactions  . Sulfa Antibiotics Itching and Rash  . Sulfacetamide Sodium Itching and Rash    Current Outpatient Prescriptions on File Prior to Visit  Medication Sig Dispense Refill  . amantadine (SYMMETREL) 100 MG capsule Take 1 capsule (100 mg total) by mouth 2 (two) times daily. 180 capsule 4  . buPROPion (WELLBUTRIN) 75 MG tablet Take 2 tablets (150 mg total) by mouth daily. 60 tablet 0  . Calcium Carbonate-Vitamin D 600-400 MG-UNIT per tablet Take 1 tablet by mouth daily.    [name] docusate sodium (COLACE) 100 MG capsule Take 100 mg by mouth 2 (  two) times daily.    . famotidine (PEPCID) 20 MG tablet Take 20 mg by mouth at bedtime.    [name] guaiFENesin (ROBITUSSIN) 100 MG/5ML liquid Take 200 mg by mouth every 6 (six) hours as needed for cough.    . interferon beta-1a (REBIF) 44 MCG/0.5ML injection Inject 44 mcg into the skin every Monday, Wednesday, and Friday.     . loperamide (IMODIUM) 2 MG capsule Take 2 mg by mouth every 4 (four) hours as needed for diarrhea or loose stools.     . ondansetron (ZOFRAN) 4 MG tablet Take 1 tablet (4 mg total) by mouth every 8 (eight) hours as needed for nausea or vomiting. 20 tablet 0  . oxybutynin (DITROPAN) 5 MG tablet Take 1 tablet (5 mg total) by mouth daily. 90 tablet 4  . polyethylene glycol (MIRALAX / GLYCOLAX) packet Take 17 g by mouth daily. 14 each 6  . senna (SENOKOT) 8.6 MG tablet Take 1 tablet by mouth daily.    . Skin Protectants, Misc.  (EUCERIN) cream Apply 1 application topically 2 (two) times daily as needed for dry skin.     No current facility-administered medications on file prior to visit.     BP (!) 144/98 (BP Location: Left Arm, Patient Position: Sitting, Cuff Size: Normal)   Pulse 100   Temp 98.3 F (36.8 C) (Oral)   Resp 18   Ht 5' 4.5" (1.638 m)   Wt 121 lb 6.1 oz (55.1 kg)   LMP 08/09/2008   BMI 20.51 kg/m      Review of Systems  HENT: Negative for congestion, dental problem, hearing loss, rhinorrhea, sinus pressure, sore throat and tinnitus.   Eyes: Negative for pain, discharge and visual disturbance.  Respiratory: Negative for cough and shortness of breath.   Cardiovascular: Negative for chest pain, palpitations and leg swelling.  Gastrointestinal: Negative for abdominal distention, abdominal pain, blood in stool, constipation, diarrhea, nausea and vomiting.  Genitourinary: Negative for difficulty urinating, dysuria, flank pain, frequency, hematuria, pelvic pain, urgency, vaginal bleeding, vaginal discharge and vaginal pain.  Musculoskeletal: Positive for gait problem. Negative for arthralgias and joint swelling.  Skin: Negative for rash.  Neurological: Positive for weakness. Negative for dizziness, syncope, speech difficulty, numbness and headaches.       Prominent upper extremity ataxia  Hematological: Negative for adenopathy.  Psychiatric/Behavioral: Negative for agitation, behavioral problems and dysphoric mood. The patient is not nervous/anxious.        Objective:   Physical Exam  Constitutional: She is oriented to person, place, and time. She appears well-developed and well-nourished.  HENT:  Head: Normocephalic.  Right Ear: External ear normal.  Left Ear: External ear normal.  Mouth/Throat: Oropharynx is clear and moist.  Eyes: Conjunctivae and EOM are normal. Pupils are equal, round, and reactive to light.  Neck: Normal range of motion. Neck supple. No thyromegaly present.    Cardiovascular: Normal rate, regular rhythm, normal heart sounds and intact distal pulses.   Pulmonary/Chest: Effort normal and breath sounds normal.  Abdominal: Soft. Bowel sounds are normal. She exhibits no mass. There is no tenderness.  Musculoskeletal: Normal range of motion.  Lymphadenopathy:    She has no cervical adenopathy.  Neurological: She is alert and oriented to person, place, and time. Coordination abnormal.  Skin: Skin is warm and dry. No rash noted.  Psychiatric: She has a normal mood and affect. Her behavior is normal.          Assessment & Plan:   Relapsing/remitting MS with  weakness, ataxia Left frontal dural based enlarging lesion noted on brain MRI History of breast cancer  Options discussed.  The patient and family desire local neurology follow-up.  We'll schedule consultation.  The family has been asked to bring hard copies of the brain MRI for review for the neurology consultation. We'll consider local neurosurgery/oncology referral pending neurology evaluation.  Nyoka Cowden

## 2016-09-28 NOTE — Patient Instructions (Signed)
Neurology consultation as discussed  Return in 6 months for follow-up

## 2016-10-19 ENCOUNTER — Other Ambulatory Visit: Payer: Self-pay | Admitting: *Deleted

## 2016-10-19 MED ORDER — FAMOTIDINE 20 MG PO TABS: 20.0000 mg | ORAL_TABLET | Freq: Every day | ORAL | 11 refills | Status: DC

## 2016-10-31 ENCOUNTER — Other Ambulatory Visit: Payer: Self-pay | Admitting: *Deleted

## 2016-10-31 MED ORDER — BUPROPION HCL 75 MG PO TABS: 150.0000 mg | ORAL_TABLET | Freq: Every day | ORAL | 5 refills | Status: DC

## 2016-11-09 ENCOUNTER — Telehealth: Payer: Self-pay | Admitting: Internal Medicine

## 2016-11-09 MED ORDER — BUPROPION HCL 75 MG PO TABS: 150.0000 mg | ORAL_TABLET | Freq: Every day | ORAL | 5 refills | Status: AC

## 2016-11-09 NOTE — Telephone Encounter (Signed)
Rx resent to [facility] in [place] for Bupropion.

## 2016-11-09 NOTE — Telephone Encounter (Signed)
[facility] called to advise that pt's  buPROPion (WELLBUTRIN) 75 MG tablet   was not received by [place] Can you resend

## 2016-11-19 ENCOUNTER — Other Ambulatory Visit: Payer: Self-pay | Admitting: Internal Medicine

## 2016-11-19 MED ORDER — AMANTADINE HCL 100 MG PO CAPS: 100.0000 mg | ORAL_CAPSULE | Freq: Two times a day (BID) | ORAL | 4 refills | Status: DC

## 2016-11-27 ENCOUNTER — Telehealth: Payer: Self-pay | Admitting: Emergency Medicine

## 2016-11-27 MED ORDER — DOCUSATE SODIUM 100 MG PO CAPS: 100.0000 mg | ORAL_CAPSULE | Freq: Two times a day (BID) | ORAL | 1 refills | Status: DC

## 2016-11-27 MED ORDER — OXYBUTYNIN CHLORIDE 5 MG PO TABS: 5.0000 mg | ORAL_TABLET | Freq: Every day | ORAL | 3 refills | Status: DC

## 2016-11-27 NOTE — Telephone Encounter (Signed)
Okay to refill Colace 100 mg softgel capsule, pt was taking BID?

## 2016-11-27 NOTE — Telephone Encounter (Signed)
Is it okay to refill Oxybutynin Chloride 5mg  tab (ditropan) ?  [facility]/ [place]

## 2016-11-27 NOTE — Telephone Encounter (Signed)
It is okay to refill Docusate Sodium Softgel 100 mg Capsule?  [facility]  [facility]/ [place]

## 2016-11-27 NOTE — Telephone Encounter (Signed)
Rx sent to [facility].

## 2016-11-27 NOTE — Telephone Encounter (Signed)
Okay to refill? 

## 2016-11-27 NOTE — Telephone Encounter (Signed)
Rx sent to pharmacy   

## 2016-11-27 NOTE — Addendum Note (Signed)
Addended by: Marian Sorrow on: 11/27/2016 04:53 PM   Modules accepted: Orders

## 2016-11-28 ENCOUNTER — Other Ambulatory Visit: Payer: Self-pay | Admitting: Internal Medicine

## 2016-11-28 MED ORDER — CALCIUM CARBONATE-VITAMIN D 600-400 MG-UNIT PO TABS
1.0000 | ORAL_TABLET | Freq: Every day | ORAL | 5 refills | Status: DC
Start: 2016-11-28 — End: 2017-06-06

## 2016-11-28 MED ORDER — POLYETHYLENE GLYCOL 3350 17 G PO PACK: 17.0000 g | PACK | Freq: Every day | ORAL | 11 refills | Status: DC

## 2016-12-14 ENCOUNTER — Ambulatory Visit (INDEPENDENT_AMBULATORY_CARE_PROVIDER_SITE_OTHER): Payer: Medicare Other | Admitting: Neurology

## 2016-12-14 ENCOUNTER — Encounter: Payer: Self-pay | Admitting: Neurology

## 2016-12-14 ENCOUNTER — Other Ambulatory Visit (INDEPENDENT_AMBULATORY_CARE_PROVIDER_SITE_OTHER): Payer: Medicare Other

## 2016-12-14 VITALS — BP 152/86 | HR 94 | Ht 64.5 in | Wt 118.6 lb

## 2016-12-14 DIAGNOSIS — R4189 Other symptoms and signs involving cognitive functions and awareness: Secondary | ICD-10-CM

## 2016-12-14 DIAGNOSIS — D329 Benign neoplasm of meninges, unspecified: Secondary | ICD-10-CM

## 2016-12-14 DIAGNOSIS — G35D Multiple sclerosis, unspecified: Secondary | ICD-10-CM

## 2016-12-14 DIAGNOSIS — N329 Bladder disorder, unspecified: Secondary | ICD-10-CM

## 2016-12-14 DIAGNOSIS — G3184 Mild cognitive impairment, so stated: Secondary | ICD-10-CM

## 2016-12-14 DIAGNOSIS — G35 Multiple sclerosis: Secondary | ICD-10-CM

## 2016-12-14 DIAGNOSIS — G35C0 Secondary progressive multiple sclerosis, unspecified: Secondary | ICD-10-CM

## 2016-12-14 DIAGNOSIS — N319 Neuromuscular dysfunction of bladder, unspecified: Secondary | ICD-10-CM | POA: Diagnosis not present

## 2016-12-14 LAB — LIPID PANEL
CHOLESTEROL: 153 mg/dL (ref 0–200)
HDL: 62.8 mg/dL (ref >39.00)
LDL CALC: 72 mg/dL (ref 0–99)
NonHDL: 90.49
TRIGLYCERIDES: 92 mg/dL (ref 0.0–149.0)
Total CHOL/HDL Ratio: 2
VLDL: 18.4 mg/dL (ref 0.0–40.0)

## 2016-12-14 LAB — CBC WITH DIFFERENTIAL/PLATELET
BASOS PCT: 0.4 % (ref 0.0–3.0)
Basophils Absolute: 0 10*3/uL (ref 0.0–0.1)
EOS ABS: 0 10*3/uL (ref 0.0–0.7)
Eosinophils Relative: 0 % (ref 0.0–5.0)
HCT: 35.2 % — ABNORMAL LOW (ref 36.0–46.0)
Hemoglobin: 11.9 g/dL — ABNORMAL LOW (ref 12.0–15.0)
LYMPHS ABS: 1.5 10*3/uL (ref 0.7–4.0)
Lymphocytes Relative: 29.1 % (ref 12.0–46.0)
MCHC: 34 g/dL (ref 30.0–36.0)
MCV: 89.5 fl (ref 78.0–100.0)
MONO ABS: 0.5 10*3/uL (ref 0.1–1.0)
Monocytes Relative: 9.5 % (ref 3.0–12.0)
NEUTROS PCT: 61 % (ref 43.0–77.0)
Neutro Abs: 3.2 10*3/uL (ref 1.4–7.7)
PLATELETS: 94 10*3/uL — AB (ref 150.0–400.0)
RBC: 3.93 Mil/uL (ref 3.87–5.11)
RDW: 14.3 % (ref 11.5–15.5)
WBC: 5.2 10*3/uL (ref 4.0–10.5)

## 2016-12-14 NOTE — Progress Notes (Signed)
NEUROLOGY CONSULTATION NOTE  [name] MRN: [id number] DOB: July 22, 1961  Referring provider: Dr. Burnice Logan Primary care provider: Dr. Burnice Logan  Reason for consult:  Establish care for multiple sclerosis  HISTORY OF PRESENT ILLNESS: Stephanie Zhang is a 56 year old female with osteoporosis, GERD and history of breast cancer who presents to establish care for multiple sclerosis.  She is accompanied by her sister who helps supplement history, although both are not able to provide a thorough history.  She was diagnosed with multiple sclerosis at around age 78.  She reports left sided numbness, ataxia, and visual disturbance.  She had bilateral INO.  She did undergo a lumbar puncture at the time.Stephanie Zhang  She was initially on Avonex, which was switched to Rebif in 2004 due to change in insurance. She has required treatment of flare-ups with Solu-Medrol, usually presenting as worsening gait with falls.  As per prior neurologist's notes, her last steroid treatment was in 2010.  She has had a progressive physical decline since then.  She currently lives in assisted living at [place].  At baseline, she exhibits extra-pyramidal symptoms such as tremor.  She has memory deficits.  She requires use of a walker since 2005.  She has neurogenic bladder.  She needs to push down on her stomach in order to help void.  She sometimes has constipation or diarrhea.  Most recent MRI of brain with and without contrast from 09/08/16 reports "no change in the appearance of numerous subcortical, periventricular, and deep white matter T2 hyperintense lesions" when compared to prior imaging from 10/23/08.  However, it also reportsmild interval enlargement of a meningioma "in the left frontal dura which measures 5 x 11 mm, previously 2 x 10 mm."  She was referred to neurosurgery but her sister never made her the appointment.  Labs from 03/27/16 include:  CBC with WBC 4.5, HGB 12.2, HCT 36.3, PLT 173, ALC 1.2;  Hepatic panel with total bili 0.6, ALP 120, AST 16, and ALT 13; HCV antibody negative.  PAST MEDICAL HISTORY: Past Medical History:  Diagnosis Date  . Anxiety   . Blood transfusion    "when I was born"  . Breast cancer ([place]) 10/2001   Stage II (right side)  . Chronic back pain   . Confusion   . Depression   . Gastroparesis   . GERD (gastroesophageal reflux disease)   . Headache(784.0)    "lots"  . Hypoglycemia   . MS (multiple sclerosis) ([place]) 1987  . Multiple sclerosis   . Osteoporosis   . Short-term memory loss     PAST SURGICAL HISTORY: Past Surgical History:  Procedure Laterality Date  . BREAST BIOPSY  2002   right  . BREAST LUMPECTOMY  2002   right  . COLONOSCOPY N/A 04/21/2013   Procedure: COLONOSCOPY;  Surgeon: Ladene Artist, MD;  Location: [facility];  Service: Endoscopy;  Laterality: N/A;    MEDICATIONS: Current Outpatient Prescriptions on File Prior to Visit  Medication Sig Dispense Refill  . amantadine (SYMMETREL) 100 MG capsule Take 1 capsule (100 mg total) by mouth 2 (two) times daily. 180 capsule 4  . buPROPion (WELLBUTRIN) 75 MG tablet Take 2 tablets (150 mg total) by mouth daily. 60 tablet 5  . Calcium Carbonate-Vitamin D 600-400 MG-UNIT tablet Take 1 tablet by mouth daily. 30 tablet 5  . docusate sodium (COLACE) 100 MG capsule Take 1 capsule (100 mg total) by mouth 2 (two) times daily. 180 capsule 1  . famotidine (PEPCID) 20 MG  tablet Take 1 tablet (20 mg total) by mouth at bedtime. 30 tablet 11  . guaiFENesin (ROBITUSSIN) 100 MG/5ML liquid Take 200 mg by mouth every 6 (six) hours as needed for cough.    . interferon beta-1a (REBIF) 44 MCG/0.5ML injection Inject 44 mcg into the skin every Monday, Wednesday, and Friday.     . loperamide (IMODIUM) 2 MG capsule Take 2 mg by mouth every 4 (four) hours as needed for diarrhea or loose stools.     . ondansetron (ZOFRAN) 4 MG tablet Take 1 tablet (4 mg total) by mouth every 8 (eight) hours as needed for  nausea or vomiting. 20 tablet 0  . oxybutynin (DITROPAN) 5 MG tablet Take 1 tablet (5 mg total) by mouth daily. 90 tablet 3  . polyethylene glycol (MIRALAX / GLYCOLAX) packet Take 17 g by mouth daily. 14 each 11  . senna (SENOKOT) 8.6 MG tablet Take 1 tablet by mouth daily.    . Skin Protectants, Misc. (EUCERIN) cream Apply 1 application topically 2 (two) times daily as needed for dry skin.     No current facility-administered medications on file prior to visit.     ALLERGIES: Allergies  Allergen Reactions  . Sulfa Antibiotics Itching and Rash  . Sulfacetamide Sodium Itching and Rash    FAMILY HISTORY: Family History  Problem Relation Age of Onset  . Diabetes Mother   . Cancer Mother     cervical cancer  . Heart disease Mother   . Diabetes Father     SOCIAL HISTORY: Social History   Social History  . Marital status: Widowed    Spouse name: N/A  . Number of children: N/A  . Years of education: N/A   Occupational History  . Not on file.   Social History Main Topics  . Smoking status: Former Smoker    Quit date: 05/11/2011  . Smokeless tobacco: Never Used  . Alcohol use No     Comment: "last alcohol 12/04/11"  . Drug use: No     Comment: "last drug use ~ 1980"  . Sexual activity: No   Other Topics Concern  . Not on file   Social History Narrative  . No narrative on file    REVIEW OF SYSTEMS: Constitutional: No fevers, chills, or sweats, no generalized fatigue, change in appetite Eyes: No visual changes, double vision, eye pain Ear, nose and throat: No hearing loss, ear pain, nasal congestion, sore throat Cardiovascular: No chest pain, palpitations Respiratory:  No shortness of breath at rest or with exertion, wheezes GastrointestinaI: No nausea, vomiting, diarrhea, abdominal pain, fecal incontinence Genitourinary:  No dysuria, urinary retention or frequency Musculoskeletal:  No neck pain, back pain Integumentary: No rash, pruritus, skin  lesions Neurological: as above Psychiatric: No depression, insomnia, anxiety Endocrine: No palpitations, fatigue, diaphoresis, mood swings, change in appetite, change in weight, increased thirst Hematologic/Lymphatic:  No purpura, petechiae. Allergic/Immunologic: no itchy/runny eyes, nasal congestion, recent allergic reactions, rashes  PHYSICAL EXAM: Vitals:   12/14/16 1106  BP: (!) 152/86  Pulse: 94   General: No acute distress.  Patient appears well-groomed.  Head:  Normocephalic/atraumatic Eyes:  fundi examined but not visualized Neck: supple, no paraspinal tenderness, full range of motion Back: No paraspinal tenderness Heart: regular rate and rhythm Lungs: Clear to auscultation bilaterally. Vascular: No carotid bruits. Neurological Exam: Mental status: alert and oriented to person, place, and time, recent and remote memory intact, fund of knowledge intact, attention and concentration intact, speech fluent and not dysarthric, language intact.  Cranial nerves: CN I: not tested CN II: pupils equal, round and reactive to light, visual fields intact CN III, IV, VI:  Bilateral internuclear ophthalmoplegia, no ptosis CN V: facial sensation intact CN VII: upper and lower face symmetric CN VIII: hearing intact CN IX, X: gag intact, uvula midline CN XI: sternocleidomastoid and trapezius muscles intact CN XII: tongue midline Bulk & Tone: normal, no fasciculations. Motor:  5/5 throughout.  Head tremor. Sensation:  Temperature and vibration sensation intact. Deep Tendon Reflexes:  2+ throughout, toes downgoing. Finger to nose testing:  Bilateral dysmetria, bilateral tremor Heel to shin:  Bilateral dysmetria. Gait:  Spastic/ataxic.    IMPRESSION: Secondary progressive multiple sclerosis Meningioma, has increased in size by over half in the past several years Neurogenic bladder Memory deficits secondary to MS  PLAN: 1.  Continue Rebif.  Check CBC with diff and hepatic panel. 2.   She is on amantadine, which she says helps with her tremors. 3.  We will refer her to urology, as she is unable to void despite Ditropan. 4.  Advised sister to get CDs of her MRIs from [facility] and bring them to the appointment.  Afterwards, I would like her to bring the CDs to me for review and enter into our system. 5.  Follow up in 6 months.  Will repeat labs prior to that.  Thank you for allowing me to take part in the care of this patient.  Metta Clines, DO  CC:  Bluford Kaufmann, MD

## 2016-12-14 NOTE — Patient Instructions (Signed)
1.  Continue Rebif 2.  We will check CBC with diff and hepatic panel today. 3.  We will refer you to neurosurgery to evaluate the meningioma.  However, I want you to get a copy of the MRIs on CD (from 2009 and from 2017) and bring it with you to the appointment.  Then after you see them, bring the CDs to me for review. 4.  We will refer you to urology for your bladder problems 5.  Repeat labs in 6 months and follow up soon afterwards.

## 2016-12-17 ENCOUNTER — Telehealth: Payer: Self-pay

## 2016-12-17 DIAGNOSIS — Z79899 Other long term (current) drug therapy: Secondary | ICD-10-CM

## 2016-12-17 NOTE — Telephone Encounter (Signed)
Spoke to sister [place]. Gave results and hepatic lab instructions.  Sister verbalized understanding.

## 2016-12-17 NOTE — Telephone Encounter (Signed)
-----   Message from Pieter Partridge, DO sent at 12/17/2016 12:08 PM EST ----- I reviewed Stephanie Zhang's labs.  Her platelet count is low, so I want to keep an eye on that.  I would like to repeat CBC in 3 months.  I wanted to check hepatic panel, but a lipid panel was checked by mistake.  I would like her to get a hepatic panel checked.

## 2016-12-18 ENCOUNTER — Other Ambulatory Visit (INDEPENDENT_AMBULATORY_CARE_PROVIDER_SITE_OTHER): Payer: Medicare Other

## 2016-12-18 ENCOUNTER — Ambulatory Visit: Payer: Medicare Other | Admitting: Neurology

## 2016-12-18 DIAGNOSIS — Z79899 Other long term (current) drug therapy: Secondary | ICD-10-CM | POA: Diagnosis not present

## 2016-12-18 LAB — HEPATIC FUNCTION PANEL
ALT: 10 U/L (ref 0–35)
AST: 14 U/L (ref 0–37)
Albumin: 3.5 g/dL (ref 3.5–5.2)
Alkaline Phosphatase: 114 U/L (ref 39–117)
BILIRUBIN DIRECT: 0.1 mg/dL (ref 0.0–0.3)
BILIRUBIN TOTAL: 0.5 mg/dL (ref 0.2–1.2)
Total Protein: 6.2 g/dL (ref 6.0–8.3)

## 2016-12-20 ENCOUNTER — Telehealth: Payer: Self-pay | Admitting: Internal Medicine

## 2016-12-20 NOTE — Telephone Encounter (Signed)
Please see message below

## 2016-12-20 NOTE — Telephone Encounter (Signed)
Stephanie Zhang aw/ [facility] would like the dr to know pt has a stomach bug, temp of 100, BP 120/80. They are keeping her in her room, fluids, and pt is able to eat. The noro virus is going around there,

## 2016-12-24 ENCOUNTER — Telehealth: Payer: Self-pay | Admitting: Neurology

## 2016-12-24 NOTE — Telephone Encounter (Signed)
Stephanie Zhang 07/23/1961. Her sister came by the office and was wanting to follow up on the urologist and Neurosurgeon referrals. Lafonda Mosses (sister) [phone]. Thank you

## 2016-12-25 NOTE — Telephone Encounter (Signed)
Called sister to inform referrals sent to urologist and neurosurgery.  No answer.

## 2017-01-02 ENCOUNTER — Telehealth: Payer: Self-pay | Admitting: Internal Medicine

## 2017-01-02 NOTE — Telephone Encounter (Signed)
Pt husband passed away yesterday and pt is upset and the facility would like something for anxiety please fax to [phone]

## 2017-01-03 ENCOUNTER — Other Ambulatory Visit: Payer: Self-pay | Admitting: Internal Medicine

## 2017-01-03 MED ORDER — DOCUSATE SODIUM 100 MG PO CAPS: 100.0000 mg | ORAL_CAPSULE | Freq: Two times a day (BID) | ORAL | 1 refills | Status: AC

## 2017-01-03 NOTE — Telephone Encounter (Signed)
See message below, please advise.

## 2017-01-04 MED ORDER — LORAZEPAM 0.5 MG PO TABS: 0.5000 mg | ORAL_TABLET | Freq: Every day | ORAL | 0 refills | Status: DC | PRN

## 2017-01-04 NOTE — Telephone Encounter (Signed)
Rx faxed to [phone]

## 2017-01-04 NOTE — Telephone Encounter (Signed)
Lorazepam 0.5 one twice a day when necessary anxiety #60

## 2017-01-14 DIAGNOSIS — R030 Elevated blood-pressure reading, without diagnosis of hypertension: Secondary | ICD-10-CM | POA: Diagnosis not present

## 2017-01-14 DIAGNOSIS — Z681 Body mass index (BMI) 19 or less, adult: Secondary | ICD-10-CM | POA: Diagnosis not present

## 2017-01-14 DIAGNOSIS — D329 Benign neoplasm of meninges, unspecified: Secondary | ICD-10-CM | POA: Diagnosis not present

## 2017-01-18 ENCOUNTER — Other Ambulatory Visit: Payer: Self-pay | Admitting: Internal Medicine

## 2017-01-18 ENCOUNTER — Telehealth: Payer: Self-pay | Admitting: Internal Medicine

## 2017-01-18 DIAGNOSIS — R531 Weakness: Secondary | ICD-10-CM

## 2017-01-18 NOTE — Telephone Encounter (Signed)
Okay for OT and PT 

## 2017-01-18 NOTE — Telephone Encounter (Signed)
See message below, please advise.

## 2017-01-18 NOTE — Telephone Encounter (Signed)
Pt has MS and just getting over stomach bug and kate is requesting OT and PT for weakness

## 2017-01-24 ENCOUNTER — Telehealth: Payer: Self-pay | Admitting: Neurology

## 2017-01-24 NOTE — Telephone Encounter (Signed)
Patient sister Rodena Piety carter came by and states that the patient needs  Refill on her rebif medication [facility]. Rodena Piety phone number is [phone]

## 2017-01-31 MED ORDER — INTERFERON BETA-1A 44 MCG/0.5ML ~~LOC~~ SOSY: 44.0000 ug | PREFILLED_SYRINGE | SUBCUTANEOUS | 12 refills | Status: DC

## 2017-02-04 ENCOUNTER — Telehealth: Payer: Self-pay | Admitting: Neurology

## 2017-02-04 NOTE — Telephone Encounter (Signed)
[facility] to call for verification [phone]

## 2017-02-04 NOTE — Telephone Encounter (Signed)
[facility] called and needed a prescription verification/Dawn CB# [phone] OPT#3

## 2017-02-04 NOTE — Telephone Encounter (Signed)
Verified Rebif dosage (it was written for one dose at a time instead of a month supply). They changed to one month with 11 refills and they will call with any other questions.

## 2017-02-07 ENCOUNTER — Telehealth: Payer: Self-pay

## 2017-02-07 NOTE — Telephone Encounter (Signed)
Pt was released from Dr Gearldine Shown care 08/31/14. Sister Laurence Aly  was calling to see if Dr Benay Spice can do surgery on pt's tumor. Explained Dr Benay Spice is not a Psychologist, sport and exercise.  She was also looking for information about the pt's husband who is deceased. He was a pt at [facility] 5 years ago.   Called her back after research and instructed her that executor of estate for husband can request his records.

## 2017-02-11 DIAGNOSIS — R8271 Bacteriuria: Secondary | ICD-10-CM | POA: Diagnosis not present

## 2017-02-11 DIAGNOSIS — N319 Neuromuscular dysfunction of bladder, unspecified: Secondary | ICD-10-CM | POA: Diagnosis not present

## 2017-03-06 DIAGNOSIS — R3915 Urgency of urination: Secondary | ICD-10-CM | POA: Diagnosis not present

## 2017-04-12 ENCOUNTER — Encounter: Payer: Self-pay | Admitting: Internal Medicine

## 2017-04-12 ENCOUNTER — Ambulatory Visit (INDEPENDENT_AMBULATORY_CARE_PROVIDER_SITE_OTHER): Payer: Medicare Other | Admitting: Internal Medicine

## 2017-04-12 VITALS — BP 118/64 | HR 92 | Temp 98.8°F | Ht 64.5 in | Wt 116.6 lb

## 2017-04-12 DIAGNOSIS — R443 Hallucinations, unspecified: Secondary | ICD-10-CM

## 2017-04-12 DIAGNOSIS — D500 Iron deficiency anemia secondary to blood loss (chronic): Secondary | ICD-10-CM | POA: Diagnosis not present

## 2017-04-12 DIAGNOSIS — G35 Multiple sclerosis: Secondary | ICD-10-CM

## 2017-04-12 LAB — CBC WITH DIFFERENTIAL/PLATELET
BASOS ABS: 0 10*3/uL (ref 0.0–0.1)
Basophils Relative: 0.4 % (ref 0.0–3.0)
EOS ABS: 0.1 10*3/uL (ref 0.0–0.7)
Eosinophils Relative: 1.9 % (ref 0.0–5.0)
HCT: 33.9 % — ABNORMAL LOW (ref 36.0–46.0)
HEMOGLOBIN: 11.4 g/dL — AB (ref 12.0–15.0)
LYMPHS ABS: 0.9 10*3/uL (ref 0.7–4.0)
Lymphocytes Relative: 23.9 % (ref 12.0–46.0)
MCHC: 33.6 g/dL (ref 30.0–36.0)
MCV: 88.3 fl (ref 78.0–100.0)
MONO ABS: 0.5 10*3/uL (ref 0.1–1.0)
Monocytes Relative: 13.1 % — ABNORMAL HIGH (ref 3.0–12.0)
NEUTROS PCT: 60.7 % (ref 43.0–77.0)
Neutro Abs: 2.4 10*3/uL (ref 1.4–7.7)
Platelets: 121 10*3/uL — ABNORMAL LOW (ref 150.0–400.0)
RBC: 3.85 Mil/uL — AB (ref 3.87–5.11)
RDW: 14.8 % (ref 11.5–15.5)
WBC: 3.9 10*3/uL — ABNORMAL LOW (ref 4.0–10.5)

## 2017-04-12 LAB — COMPREHENSIVE METABOLIC PANEL
ALBUMIN: 3.5 g/dL (ref 3.5–5.2)
ALK PHOS: 113 U/L (ref 39–117)
ALT: 13 U/L (ref 0–35)
AST: 16 U/L (ref 0–37)
BUN: 9 mg/dL (ref 6–23)
CO2: 27 mEq/L (ref 19–32)
CREATININE: 0.78 mg/dL (ref 0.40–1.20)
Calcium: 9.1 mg/dL (ref 8.4–10.5)
Chloride: 108 mEq/L (ref 96–112)
GFR: 81.38 mL/min (ref >60.00)
GLUCOSE: 93 mg/dL (ref 70–99)
Potassium: 3.4 mEq/L — ABNORMAL LOW (ref 3.5–5.1)
SODIUM: 142 meq/L (ref 135–145)
TOTAL PROTEIN: 5.6 g/dL — AB (ref 6.0–8.3)
Total Bilirubin: 0.5 mg/dL (ref 0.2–1.2)

## 2017-04-12 LAB — VITAMIN B12: Vitamin B-12: 260 pg/mL (ref 211–911)

## 2017-04-12 LAB — TSH: TSH: 2.25 u[IU]/mL (ref 0.35–4.50)

## 2017-04-12 NOTE — Progress Notes (Signed)
Pre visit review using our clinic review tool, if applicable. No additional management support is needed unless otherwise documented below in the visit note. 

## 2017-04-12 NOTE — Patient Instructions (Addendum)
WE NOW OFFER   Stephanie Zhang's FAST TRACK!!!  SAME DAY Appointments for ACUTE CARE  Such as: Sprains, Injuries, cuts, abrasions, rashes, muscle pain, joint pain, back pain Colds, flu, sore throats, headache, allergies, cough, fever  Ear pain, sinus and eye infections Abdominal pain, nausea, vomiting, diarrhea, upset stomach Animal/insect bites  3 Easy Ways to Schedule: Walk-In Scheduling Call in scheduling Mychart Sign-up: [email or web address]   Neurology follow-up as scheduled  Report any new or worsening symptoms

## 2017-04-12 NOTE — Progress Notes (Signed)
Subjective:    Patient ID: Stephanie Zhang, female    DOB: December 12, 1960, 56 y.o.   MRN: [id number]  HPI 56 year old patient who has a history of MS.  She is followed by neurology. For the past few months, she has had recurrent auditory and visual hallucinations.  Visual hallucinations have included her ex-husband who is now deceased.  Some hallucinations have been frightening for the patient. No new medications. Patient does have a history of mild thrombocytopenia  Past Medical History:  Diagnosis Date  . Anxiety   . Blood transfusion    "when I was born"  . Breast cancer ([place]) 10/2001   Stage II (right side)  . Chronic back pain   . Confusion   . Depression   . Gastroparesis   . GERD (gastroesophageal reflux disease)   . Headache(784.0)    "lots"  . Hypoglycemia   . MS (multiple sclerosis) ([place]) 1987  . Multiple sclerosis   . Osteoporosis   . Short-term memory loss      Social History   Social History  . Marital status: Widowed    Spouse name: N/A  . Number of children: N/A  . Years of education: N/A   Occupational History  . Not on file.   Social History Main Topics  . Smoking status: Former Smoker    Quit date: 05/11/2011  . Smokeless tobacco: Never Used  . Alcohol use No     Comment: "last alcohol 12/04/11"  . Drug use: No     Comment: "last drug use ~ 1980"  . Sexual activity: No   Other Topics Concern  . Not on file   Social History Narrative  . No narrative on file    Past Surgical History:  Procedure Laterality Date  . BREAST BIOPSY  2002   right  . BREAST LUMPECTOMY  2002   right  . COLONOSCOPY N/A 04/21/2013   Procedure: COLONOSCOPY;  Surgeon: Ladene Artist, MD;  Location: [facility];  Service: Endoscopy;  Laterality: N/A;    Family History  Problem Relation Age of Onset  . Diabetes Mother   . Cancer Mother     cervical cancer  . Heart disease Mother   . Diabetes Father     Allergies  Allergen Reactions  . Sulfa Antibiotics  Itching and Rash  . Sulfacetamide Sodium Itching and Rash    Current Outpatient Prescriptions on File Prior to Visit  Medication Sig Dispense Refill  . amantadine (SYMMETREL) 100 MG capsule Take 1 capsule (100 mg total) by mouth 2 (two) times daily. 180 capsule 4  . buPROPion (WELLBUTRIN) 75 MG tablet Take 2 tablets (150 mg total) by mouth daily. 60 tablet 5  . Calcium Carbonate-Vitamin D 600-400 MG-UNIT tablet Take 1 tablet by mouth daily. 30 tablet 5  . docusate sodium (COLACE) 100 MG capsule Take 1 capsule (100 mg total) by mouth 2 (two) times daily. 180 capsule 1  . famotidine (PEPCID) 20 MG tablet Take 1 tablet (20 mg total) by mouth at bedtime. 30 tablet 11  . guaiFENesin (ROBITUSSIN) 100 MG/5ML liquid Take 200 mg by mouth every 6 (six) hours as needed for cough.    . interferon beta-1a (REBIF) 44 MCG/0.5ML SOSY injection Inject 0.5 mLs (44 mcg total) into the skin 3 (three) times a week. 0.5 mL 12  . loperamide (IMODIUM) 2 MG capsule Take 2 mg by mouth every 4 (four) hours as needed for diarrhea or loose stools.     [name] LORazepam (  ATIVAN) 0.5 MG tablet Take 1 tablet (0.5 mg total) by mouth daily as needed for anxiety. 60 tablet 0  . ondansetron (ZOFRAN) 4 MG tablet Take 1 tablet (4 mg total) by mouth every 8 (eight) hours as needed for nausea or vomiting. 20 tablet 0  . oxybutynin (DITROPAN) 5 MG tablet Take 1 tablet (5 mg total) by mouth daily. 90 tablet 3  . polyethylene glycol (MIRALAX / GLYCOLAX) packet Take 17 g by mouth daily. 14 each 11  . senna (SENOKOT) 8.6 MG tablet Take 1 tablet by mouth daily.    . Skin Protectants, Misc. (EUCERIN) cream Apply 1 application topically 2 (two) times daily as needed for dry skin.     No current facility-administered medications on file prior to visit.     BP 118/64 (BP Location: Left Arm, Patient Position: Sitting, Cuff Size: Normal)   Pulse 92   Temp 98.8 F (37.1 C) (Oral)   Ht 5' 4.5" (1.638 m)   Wt 116 lb 9.6 oz (52.9 kg)   LMP  08/09/2008   SpO2 98%   BMI 19.71 kg/m      Review of Systems  Constitutional: Negative.   HENT: Negative for congestion, dental problem, hearing loss, rhinorrhea, sinus pressure, sore throat and tinnitus.   Eyes: Negative for pain, discharge and visual disturbance.  Respiratory: Negative for cough and shortness of breath.   Cardiovascular: Negative for chest pain, palpitations and leg swelling.  Gastrointestinal: Negative for abdominal distention, abdominal pain, blood in stool, constipation, diarrhea, nausea and vomiting.  Genitourinary: Negative for difficulty urinating, dysuria, flank pain, frequency, hematuria, pelvic pain, urgency, vaginal bleeding, vaginal discharge and vaginal pain.  Musculoskeletal: Negative for arthralgias, gait problem and joint swelling.  Skin: Negative for rash.  Neurological: Negative for dizziness, syncope, speech difficulty, weakness, numbness and headaches.  Hematological: Negative for adenopathy.  Psychiatric/Behavioral: Positive for confusion and hallucinations. Negative for agitation, behavioral problems and dysphoric mood. The patient is not nervous/anxious.        Objective:   Physical Exam  Constitutional: She is oriented to person, place, and time. She appears well-developed and well-nourished.  HENT:  Head: Normocephalic.  Right Ear: External ear normal.  Left Ear: External ear normal.  Mouth/Throat: Oropharynx is clear and moist.  Eyes: Conjunctivae and EOM are normal. Pupils are equal, round, and reactive to light.  Neck: Normal range of motion. Neck supple. No thyromegaly present.  Cardiovascular: Normal rate, regular rhythm, normal heart sounds and intact distal pulses.   Pulmonary/Chest: Effort normal and breath sounds normal.  Abdominal: Soft. Bowel sounds are normal. She exhibits no mass. There is no tenderness.  Musculoskeletal: Normal range of motion.  Lymphadenopathy:    She has no cervical adenopathy.  Neurological: She is  alert and oriented to person, place, and time.  Skin: Skin is warm and dry. No rash noted.  Psychiatric: She has a normal mood and affect. Her behavior is normal.          Assessment & Plan:   Multiple sclerosis History of hallucinations both auditory and visual. History of thrombocytopenia.  Will check CBC and vitamin B12 level  Neurology consultation  Nyoka Cowden

## 2017-04-16 ENCOUNTER — Other Ambulatory Visit: Payer: Medicare Other

## 2017-04-16 ENCOUNTER — Ambulatory Visit (INDEPENDENT_AMBULATORY_CARE_PROVIDER_SITE_OTHER): Payer: Medicare Other | Admitting: Neurology

## 2017-04-16 ENCOUNTER — Encounter: Payer: Self-pay | Admitting: Neurology

## 2017-04-16 VITALS — BP 144/90 | HR 84 | Ht 64.5 in | Wt 115.0 lb

## 2017-04-16 DIAGNOSIS — N319 Neuromuscular dysfunction of bladder, unspecified: Secondary | ICD-10-CM

## 2017-04-16 DIAGNOSIS — R443 Hallucinations, unspecified: Secondary | ICD-10-CM | POA: Diagnosis not present

## 2017-04-16 DIAGNOSIS — G3184 Mild cognitive impairment, so stated: Secondary | ICD-10-CM | POA: Diagnosis not present

## 2017-04-16 DIAGNOSIS — F32A Depression, unspecified: Secondary | ICD-10-CM

## 2017-04-16 DIAGNOSIS — R4189 Other symptoms and signs involving cognitive functions and awareness: Secondary | ICD-10-CM

## 2017-04-16 DIAGNOSIS — G35 Multiple sclerosis: Secondary | ICD-10-CM

## 2017-04-16 DIAGNOSIS — R030 Elevated blood-pressure reading, without diagnosis of hypertension: Secondary | ICD-10-CM

## 2017-04-16 DIAGNOSIS — F329 Major depressive disorder, single episode, unspecified: Secondary | ICD-10-CM

## 2017-04-16 NOTE — Progress Notes (Signed)
NEUROLOGY FOLLOW UP OFFICE NOTE  MIAMI LATULIPPE [id number]  HISTORY OF PRESENT ILLNESS: Stephanie Zhang is a 56 year old female with osteoporosis, GERD, thrombocytopenia, and history of breast cancer with secondary progressive multiple sclerosis who follows up for hallucinations.  She is accompanied by her sister who supplements history.  UPDATE: She is taking Rebif for disease modifying therapy She is taking amantadine 100mg  twice daily for tremor She is taking bupropion 75mg  daily for depression She is taking oxybutynin for neurogenic bladder She is taking lorazepam 0.5mg  as needed for anxiety. She was referred to neurosurgery for evaluation of meningioma.  Continued monitoring was recommended.  Her ex-husband passed away on 2017-01-18.  Since then, she reports visual and auditory hallucinations.  In the middle of the night, she may see and hear her ex-husband outside the window.  On at least one occasion, she woke up in the middle of the night and heard knocking on her window.  She saw somebody outside speaking [place].  She told the nurses and they didn't see anyone.  One afternoon, she was walking in the courtyard and saw a [place].  Her friend didn't see anything.  Later, when she was in the dining room, she saw the [place] outside the window.  Sometimes, these hallucinations are frighthening.  They occur 2 to 3 times a week.  She has not been started on any new medication.    04/12/17 Labs:  CBC with WBC 3.9, HGB 11.4, HCT 33.9, PLT 121 and MCV 88.3; CMP with Na 142, K 3.4, Cl 108, CO2 27, glucose 93, BUN 9, Cr 0.78, total bili 0.5, ALP 113, AST 16 and ALT 13; TSH 2.25; B12 260.   HISTORY: She was diagnosed with multiple sclerosis at around age 41.  She reports left sided numbness, ataxia, and visual disturbance.  She had bilateral INO.  She did undergo a lumbar puncture at the time.[name]   She was initially on Avonex, which was switched to Rebif in 2004 due to change in insurance. She  has required treatment of flare-ups with Solu-Medrol, usually presenting as worsening gait with falls.  As per prior neurologist's notes, her last steroid treatment was in 2010.  She has had a progressive physical decline since then.   She currently lives in assisted living at [place].  At baseline, she exhibits extra-pyramidal symptoms such as tremor.  She has memory deficits.  She requires use of a walker since 2005.  She has neurogenic bladder.  She needs to push down on her stomach in order to help void.  She sometimes has constipation or diarrhea.   Most recent MRI of brain with and without contrast from 09/08/16 reports "no change in the appearance of numerous subcortical, periventricular, and deep white matter T2 hyperintense lesions" when compared to prior imaging from 10/23/08.  However, it also reports mild interval enlargement of a meningioma "in the left frontal dura which measures 5 x 11 mm, previously 2 x 10 mm."  She was referred to neurosurgery but her sister never made her the appointment.  PAST MEDICAL HISTORY: Past Medical History:  Diagnosis Date  . Anxiety   . Blood transfusion    "when I was born"  . Breast cancer ([place]) 10/2001   Stage II (right side)  . Chronic back pain   . Confusion   . Depression   . Gastroparesis   . GERD (gastroesophageal reflux disease)   . Headache(784.0)    "lots"  . Hypoglycemia   .  MS (multiple sclerosis) ([place]) 1987  . Multiple sclerosis   . Osteoporosis   . Short-term memory loss     MEDICATIONS: Current Outpatient Prescriptions on File Prior to Visit  Medication Sig Dispense Refill  . buPROPion (WELLBUTRIN) 75 MG tablet Take 2 tablets (150 mg total) by mouth daily. 60 tablet 5  . Calcium Carbonate-Vitamin D 600-400 MG-UNIT tablet Take 1 tablet by mouth daily. 30 tablet 5  . docusate sodium (COLACE) 100 MG capsule Take 1 capsule (100 mg total) by mouth 2 (two) times daily. 180 capsule 1  . famotidine (PEPCID) 20 MG tablet Take 1  tablet (20 mg total) by mouth at bedtime. 30 tablet 11  . guaiFENesin (ROBITUSSIN) 100 MG/5ML liquid Take 200 mg by mouth every 6 (six) hours as needed for cough.    . interferon beta-1a (REBIF) 44 MCG/0.5ML SOSY injection Inject 0.5 mLs (44 mcg total) into the skin 3 (three) times a week. 0.5 mL 12  . loperamide (IMODIUM) 2 MG capsule Take 2 mg by mouth every 4 (four) hours as needed for diarrhea or loose stools.     [name] LORazepam (ATIVAN) 0.5 MG tablet Take 1 tablet (0.5 mg total) by mouth daily as needed for anxiety. 60 tablet 0  . oxybutynin (DITROPAN) 5 MG tablet Take 1 tablet (5 mg total) by mouth daily. 90 tablet 3  . polyethylene glycol (MIRALAX / GLYCOLAX) packet Take 17 g by mouth daily. 14 each 11  . Skin Protectants, Misc. (EUCERIN) cream Apply 1 application topically 2 (two) times daily as needed for dry skin.     No current facility-administered medications on file prior to visit.     ALLERGIES: Allergies  Allergen Reactions  . Sulfa Antibiotics Itching and Rash  . Sulfacetamide Sodium Itching and Rash    FAMILY HISTORY: Family History  Problem Relation Age of Onset  . Diabetes Mother   . Cancer Mother     cervical cancer  . Heart disease Mother   . Diabetes Father     SOCIAL HISTORY: Social History   Social History  . Marital status: Widowed    Spouse name: N/A  . Number of children: N/A  . Years of education: N/A   Occupational History  . Not on file.   Social History Main Topics  . Smoking status: Former Smoker    Quit date: 05/11/2011  . Smokeless tobacco: Never Used  . Alcohol use No     Comment: "last alcohol 12/04/11"  . Drug use: No     Comment: "last drug use ~ 1980"  . Sexual activity: No   Other Topics Concern  . Not on file   Social History Narrative  . No narrative on file    REVIEW OF SYSTEMS: Constitutional: No fevers, chills, or sweats, no generalized fatigue, change in appetite Eyes: No visual changes, double vision, eye  pain Ear, nose and throat: No hearing loss, ear pain, nasal congestion, sore throat Cardiovascular: No chest pain, palpitations Respiratory:  No shortness of breath at rest or with exertion, wheezes GastrointestinaI: No nausea, vomiting, diarrhea, abdominal pain, fecal incontinence Genitourinary:  No dysuria, urinary retention or frequency Musculoskeletal:  No neck pain, back pain Integumentary: No rash, pruritus, skin lesions Neurological: as above Psychiatric: depression, psychosis Endocrine: No palpitations, fatigue, diaphoresis, mood swings, change in appetite, change in weight, increased thirst Hematologic/Lymphatic:  No purpura, petechiae. Allergic/Immunologic: no itchy/runny eyes, nasal congestion, recent allergic reactions, rashes  PHYSICAL EXAM: Vitals:   04/16/17 0938  BP: [name])  144/90  Pulse: 84   General: No acute distress.  Patient appears well-groomed.  Head:  Normocephalic/atraumatic Eyes:  Fundi examined but not visualized Neck: supple, no paraspinal tenderness, full range of motion Heart:  Regular rate and rhythm Lungs:  Clear to auscultation bilaterally Back: No paraspinal tenderness Neurological Exam: alert and oriented to person, place, and time. Attention span and concentration intact, recent and remote memory intact, fund of knowledge intact.  Speech fluent and not dysarthric, language intact.  Bilateral internuclear ophthalmoplegia.  Otherwise, CN II-XII intact. Bulk and tone normal, muscle strength 5/5 throughout.  Sensation to light touch intact.  Deep tendon reflexes 2+ throughout, toes downgoing.  Finger to nose testing with bilateral tremor and dysmetria and heel to shin testing with dysmetria  Gait not checked.  IMPRESSION: 1.  Hallucinations (visual and auditory).  Unclear etiology.  Visual hallucinations are common with amantadine, however auditory hallucinations are unusual.  Hallucinations are uncommon as a direct result of MS, although it does happen.   Given the time frame (started after her ex-husband passed away), they may be due to combination of depression and underlying cognitive impairment. 2.  Multiple sclerosis. 3.  Cerebral meningioma 4.  Elevated blood pressure  PLAN: She will discontinue amantadine.  If symptoms not resolved by next week, then she is to contact us and we will check MRI of brain with and without contrast and start an atypical antipsychotic. 2.  Continue Rebif. 3.  Check vitamin D level 4.  In 6 months, repeat MRI of brain with and without contrast, as well as CBC with diff, hepatic panel and vitamin D level. 5.  Follow up blood pressure with PCP. 6.  Follow up in 6 months after retests.  25 minutes spent face to face with patient, over 50% spent discussing differential diagnoses and plan.   Metta Clines, DO  CC:  Bluford Kaufmann, MD

## 2017-04-16 NOTE — Patient Instructions (Addendum)
1.  Hallucinations may be side effect of amantadine.  Discontinue amantadine. 2.  Contact me in 1 week.  If the hallucinations improved, then it is likely the amantadine.  If they continue, we will repeat MRI of the brain with and without contrast and will treat the hallucinations with medications such as Zyprexa, Risperdal or Seroquel. 3.  Follow up in 6 months. 4. Check vitamin d today. 5. Labs prior to next appointment.

## 2017-04-17 ENCOUNTER — Telehealth: Payer: Self-pay

## 2017-04-17 LAB — VITAMIN D 25 HYDROXY (VIT D DEFICIENCY, FRACTURES): Vit D, 25-Hydroxy: 39 ng/mL (ref 30–100)

## 2017-04-17 NOTE — Telephone Encounter (Signed)
-----   Message from Pieter Partridge, DO sent at 04/17/2017  6:32 AM EDT ----- Vitamin D level is 39.  I would like it above 50.  Recommend starting D3 2000 IU daily.

## 2017-04-17 NOTE — Telephone Encounter (Signed)
Left message for patient to call office regarding her vitamin d results and recommendation of starting D3 2000IU daily.

## 2017-04-18 ENCOUNTER — Telehealth: Payer: Self-pay

## 2017-04-18 ENCOUNTER — Telehealth: Payer: Self-pay | Admitting: Neurology

## 2017-04-18 NOTE — Telephone Encounter (Signed)
Received a call from Juliann Pulse [facility]) regarding medication: starting her on Vitamin D daily and stopping another medication .  Patient needs a refill of medication. No  Patient having side effects from medication. No  Patient calling to update [place] on medication. Juliann Pulse would like an order faxed to her at [phone] (Attention Juliann Pulse) Thanks

## 2017-04-18 NOTE — Telephone Encounter (Signed)
-----   Message from Pieter Partridge, DO sent at 04/17/2017  6:32 AM EDT ----- Vitamin D level is 39.  I would like it above 50.  Recommend starting D3 2000 IU daily.

## 2017-04-18 NOTE — Telephone Encounter (Signed)
Informed Lenita patient sister of vit d results and recommendations.

## 2017-04-19 ENCOUNTER — Telehealth: Payer: Self-pay

## 2017-04-19 MED ORDER — VITAMIN D 50 MCG (2000 UT) PO TABS: 2000.0000 [IU] | ORAL_TABLET | Freq: Every day | ORAL | 3 refills | Status: DC

## 2017-04-19 NOTE — Telephone Encounter (Signed)
[employer] Case #[id number] for MRI of brain [phone].  Information sent to be reviewed. [employer] approved good to 05/18/17 #[id number].

## 2017-04-19 NOTE — Telephone Encounter (Signed)
Rx for vitamin d printed and faxed to [phone] patients living facility.

## 2017-04-24 ENCOUNTER — Telehealth: Payer: Self-pay | Admitting: Neurology

## 2017-04-24 NOTE — Telephone Encounter (Signed)
Stephanie Zhang with [facility] called and said she needs a prescription for Vitamin D to put in her script chart/Dawn CB# [phone]

## 2017-04-25 MED ORDER — VITAMIN D 50 MCG (2000 UT) PO CAPS
2000.0000 [IU] | ORAL_CAPSULE | Freq: Every day | ORAL | 1 refills | Status: DC
Start: 2017-04-25 — End: 2017-07-01

## 2017-04-25 NOTE — Telephone Encounter (Signed)
Order for vitamin D written and faxed to [facility] at [phone] with confirmation received.

## 2017-04-29 DIAGNOSIS — N310 Uninhibited neuropathic bladder, not elsewhere classified: Secondary | ICD-10-CM | POA: Diagnosis not present

## 2017-05-01 ENCOUNTER — Encounter: Payer: Self-pay | Admitting: Internal Medicine

## 2017-05-03 ENCOUNTER — Telehealth: Payer: Self-pay | Admitting: Neurology

## 2017-05-03 NOTE — Telephone Encounter (Signed)
Please contact Juliann Pulse.

## 2017-05-03 NOTE — Telephone Encounter (Signed)
Kathy from [facility] called and wanted to let Dr Tomi Likens know that PT has been using [employer] like carpet fresh and sprinkling it on the carpet and her bed, wanted to know if there is something else she should do besides changing rooms and carpet and mattress/Dawn  CB# [phone]

## 2017-05-11 ENCOUNTER — Encounter (HOSPITAL_COMMUNITY): Payer: Self-pay

## 2017-05-11 DIAGNOSIS — M545 Low back pain: Secondary | ICD-10-CM | POA: Insufficient documentation

## 2017-05-11 DIAGNOSIS — S199XXA Unspecified injury of neck, initial encounter: Secondary | ICD-10-CM | POA: Diagnosis not present

## 2017-05-11 DIAGNOSIS — Y92012 Bathroom of single-family (private) house as the place of occurrence of the external cause: Secondary | ICD-10-CM | POA: Insufficient documentation

## 2017-05-11 DIAGNOSIS — W010XXA Fall on same level from slipping, tripping and stumbling without subsequent striking against object, initial encounter: Secondary | ICD-10-CM | POA: Insufficient documentation

## 2017-05-11 DIAGNOSIS — Z79899 Other long term (current) drug therapy: Secondary | ICD-10-CM | POA: Insufficient documentation

## 2017-05-11 DIAGNOSIS — Z87891 Personal history of nicotine dependence: Secondary | ICD-10-CM | POA: Diagnosis not present

## 2017-05-11 DIAGNOSIS — Y999 Unspecified external cause status: Secondary | ICD-10-CM | POA: Diagnosis not present

## 2017-05-11 DIAGNOSIS — Y939 Activity, unspecified: Secondary | ICD-10-CM | POA: Diagnosis not present

## 2017-05-11 DIAGNOSIS — Z853 Personal history of malignant neoplasm of breast: Secondary | ICD-10-CM | POA: Diagnosis not present

## 2017-05-11 DIAGNOSIS — M542 Cervicalgia: Secondary | ICD-10-CM | POA: Diagnosis not present

## 2017-05-11 DIAGNOSIS — S0990XA Unspecified injury of head, initial encounter: Secondary | ICD-10-CM | POA: Diagnosis not present

## 2017-05-11 NOTE — ED Triage Notes (Signed)
Pt states fell yesterday, unsure what she landed on. Pt complaining of head ache and L neck pain. Pt also complaining of L sided body pain.

## 2017-05-12 ENCOUNTER — Emergency Department (HOSPITAL_COMMUNITY)
Admission: EM | Admit: 2017-05-12 | Discharge: 2017-05-12 | Disposition: A | Discharge status: Home / Self Care | Payer: Medicare Other | Attending: Emergency Medicine | Admitting: Emergency Medicine

## 2017-05-12 ENCOUNTER — Emergency Department (HOSPITAL_COMMUNITY): Payer: Medicare Other

## 2017-05-12 DIAGNOSIS — S199XXA Unspecified injury of neck, initial encounter: Secondary | ICD-10-CM | POA: Diagnosis not present

## 2017-05-12 DIAGNOSIS — M545 Low back pain, unspecified: Secondary | ICD-10-CM

## 2017-05-12 DIAGNOSIS — M542 Cervicalgia: Secondary | ICD-10-CM | POA: Diagnosis not present

## 2017-05-12 DIAGNOSIS — S0990XA Unspecified injury of head, initial encounter: Secondary | ICD-10-CM | POA: Diagnosis not present

## 2017-05-12 DIAGNOSIS — W19XXXA Unspecified fall, initial encounter: Secondary | ICD-10-CM

## 2017-05-12 MED ORDER — OXYCODONE-ACETAMINOPHEN 5-325 MG PO TABS
1.0000 | ORAL_TABLET | Freq: Once | ORAL | Status: AC
  Administered 2017-05-12: 1 via ORAL
  Filled 2017-05-12: qty 1

## 2017-05-12 NOTE — ED Provider Notes (Signed)
[facility] DEPT Provider Note   CSN: [id number] Arrival date & time: 05/11/17  1952 By signing my name below, I, Dyke Brackett, attest that this documentation has been prepared under the direction and in the presence of Jola Schmidt, MD . Electronically Signed: Dyke Brackett, Scribe. 05/12/2017. 2:09 AM.   History   Chief Complaint Chief Complaint  Patient presents with  . Fall  . Neck Pain   HPI Stephanie Zhang is a 56 y.o. female with a history of MS, osteoporosis and chronic back pain who presents to the Emergency Department complaining of sudden onset, constant left paracervical neck pain s/p unwitnessed fall 24 hours ago. Pt states she was pulling up her underwear after using the restroom last night and the next thing she remembers is being on the floor. She reports associated headache, lumbar back pain, and pain to bilateral arms. Pt has taken Tylenol with no reported relief of pain. Pt has no other acute complaints or associated symptoms at this time.   The history is provided by the patient. No language interpreter was used.   Past Medical History:  Diagnosis Date  . Anxiety   . Blood transfusion    "when I was born"  . Breast cancer ([place]) 10/2001   Stage II (right side)  . Chronic back pain   . Confusion   . Depression   . Gastroparesis   . GERD (gastroesophageal reflux disease)   . Headache(784.0)    "lots"  . Hypoglycemia   . MS (multiple sclerosis) ([place]) 1987  . Multiple sclerosis   . Osteoporosis   . Short-term memory loss     Patient Active Problem List   Diagnosis Date Noted  . Special screening for malignant neoplasms, colon 04/21/2013  . Gastroparesis 04/18/2012  . Weight loss, unintentional 04/16/2012  . Malnutrition ([place]) 04/16/2012  . MS (multiple sclerosis) ([place])   . Cancer ([place])   . Hypoglycemia   . MULTIPLE SCLEROSIS, RELAPSING/REMITTING 02/16/2010  . WEAKNESS 02/16/2010  . GAIT ATAXIA 02/16/2010  . RECTAL BLEEDING, HX OF 02/13/2010  .  BREAST CANCER, HX OF 08/06/2007    Past Surgical History:  Procedure Laterality Date  . BREAST BIOPSY  2002   right  . BREAST LUMPECTOMY  2002   right  . COLONOSCOPY N/A 04/21/2013   Procedure: COLONOSCOPY;  Surgeon: Ladene Artist, MD;  Location: [facility] END[facility]COPY;  Service: Endoscopy;  Laterality: N/A;    OB History    Gravida Para Term Preterm AB Living   1 0     1 0   SAB TAB Ectopic Multiple Live Births     1             Home Medications    Prior to Admission medications   Medication Sig Start Date End Date Taking? Authorizing Provider  acetaminophen (TYLENOL) 500 MG tablet Take 1,000 mg by mouth every 6 (six) hours as needed for mild pain.   Yes [provider]  amantadine (SYMMETREL) 100 MG capsule Take 100 mg by mouth 2 (two) times daily.   Yes [provider]  buPROPion (WELLBUTRIN) 75 MG tablet Take 2 tablets (150 mg total) by mouth daily. 11/09/16  Yes Marletta Lor, MD  Calcium Carbonate-Vitamin D 600-400 MG-UNIT tablet Take 1 tablet by mouth daily. 11/28/16  Yes Marletta Lor, MD  Cholecalciferol (VITAMIN D) 2000 units CAPS Take 1 capsule (2,000 Units total) by mouth daily. 04/25/17  Yes Jaffe, Adam R, DO  Cholecalciferol (VITAMIN D) 2000 units  tablet Take 1 tablet (2,000 Units total) by mouth daily. 04/19/17  Yes Jaffe, Adam R, DO  docusate sodium (COLACE) 100 MG capsule Take 1 capsule (100 mg total) by mouth 2 (two) times daily. 01/03/17  Yes Marletta Lor, MD  famotidine (PEPCID) 20 MG tablet Take 1 tablet (20 mg total) by mouth at bedtime. 10/19/16  Yes Marletta Lor, MD  guaiFENesin (ROBITUSSIN) 100 MG/5ML liquid Take 200 mg by mouth every 6 (six) hours as needed for cough.   Yes [provider]  interferon beta-1a (REBIF) 44 MCG/0.5ML SOSY injection Inject 0.5 mLs (44 mcg total) into the skin 3 (three) times a week. Patient taking differently: Inject 44 mcg into the skin every Monday, Wednesday, and Friday.  02/01/17   Yes Tomi Likens, Adam R, DO  loperamide (IMODIUM) 2 MG capsule Take 2 mg by mouth every 4 (four) hours as needed for diarrhea or loose stools.    Yes [provider]  LORazepam (ATIVAN) 0.5 MG tablet Take 1 tablet (0.5 mg total) by mouth daily as needed for anxiety. 01/04/17  Yes Marletta Lor, MD  oxybutynin (DITROPAN) 5 MG tablet Take 1 tablet (5 mg total) by mouth daily. 11/27/16  Yes Marletta Lor, MD  polyethylene glycol [facility] Floria Raveling) packet Take 17 g by mouth daily. 11/28/16  Yes Marletta Lor, MD  promethazine (PHENERGAN) 25 MG tablet Take 25 mg by mouth every 6 (six) hours as needed for nausea or vomiting.   Yes [provider]  senna (SENOKOT) 8.6 MG TABS tablet Take 1 tablet by mouth daily.   Yes [provider]  Skin Protectants, Misc. (EUCERIN) cream Apply 1 application topically 2 (two) times daily as needed for dry skin.   Yes [provider]    Family History Family History  Problem Relation Age of Onset  . Diabetes Mother   . Cancer Mother        cervical cancer  . Heart disease Mother   . Diabetes Father     Social History Social History  Substance Use Topics  . Smoking status: Former Smoker    Quit date: 05/11/2011  . Smokeless tobacco: Never Used  . Alcohol use No     Comment: "last alcohol 12/04/11"     Allergies   Sulfa antibiotics and Sulfacetamide sodium   Review of Systems Review of Systems All systems reviewed and are negative for acute change except as noted in the HPI.  Physical Exam Updated Vital Signs BP 113/81   Pulse 85   Temp 98.4 F (36.9 C) (Oral)   Resp 16   LMP 08/09/2008   SpO2 98%   Physical Exam  Constitutional: She is oriented to person, place, and time. She appears well-developed and well-nourished. No distress.  HENT:  Head: Normocephalic and atraumatic.  Eyes: EOM are normal.  Neck: Normal range of motion.  Cervical and paracervical tenderness  Cardiovascular:  Normal rate, regular rhythm and normal heart sounds.   Pulmonary/Chest: Effort normal and breath sounds normal.  Abdominal: Soft. She exhibits no distension. There is no tenderness.  Musculoskeletal: Normal range of motion.  Lumbar and para lumbar tenderness. Full range of motion of bilateral shoulders, elbows and wrists. Full range of motion of bilateral hips, knees and ankles.   Neurological: She is alert and oriented to person, place, and time.  Skin: Skin is warm and dry.  Psychiatric: She has a normal mood and affect. Judgment normal.  Nursing note and vitals reviewed.  ED Treatments / Results  DIAGNOSTIC STUDIES:  Oxygen Saturation is 98% on RA, normal by my interpretation.    COORDINATION OF CARE:  2:03 AM Discussed treatment plan with pt at bedside and pt agreed to plan.   Labs (all labs ordered are listed, but only abnormal results are displayed) Labs Reviewed - No data to display  EKG  EKG Interpretation None       Radiology Dg Lumbar Spine Complete  Result Date: 05/12/2017 CLINICAL DATA:  History of MS, chronic back pain EXAM: LUMBAR SPINE - COMPLETE 4+ VIEW COMPARISON:  CT 04/16/2012 FINDINGS: There is no evidence of lumbar spine fracture. Alignment is normal. Mild degenerative changes at L4-L5 and L5-S1. Soft tissue calcifications posterior to the sacrum. IMPRESSION: No acute osseous abnormality. Electronically Signed   By: Donavan Foil M.D.   On: 05/12/2017 03:08   Ct Head Wo Contrast  Result Date: 05/12/2017 CLINICAL DATA:  Fall with neck pain EXAM: CT HEAD WITHOUT CONTRAST CT CERVICAL SPINE WITHOUT CONTRAST TECHNIQUE: Multidetector CT imaging of the head and cervical spine was performed following the standard protocol without intravenous contrast. Multiplanar CT image reconstructions of the cervical spine were also generated. COMPARISON:  MRI 09/08/2016, CT head and cervical spine 03/26/2014 FINDINGS: CT HEAD FINDINGS Brain: No acute territorial infarction,  hemorrhage or intracranial mass is seen. Moderate periventricular white matter hypodensity. Stable ventricle size. Atrophy. Vascular: No hyperdense vessels. Left vertebral artery calcification. Skull: No fracture or suspicious bone lesion Sinuses/Orbits: Mild mucosal thickening in the maxillary and ethmoid sinuses. No acute orbital abnormality Other: None CT CERVICAL SPINE FINDINGS Alignment: Trace anterolisthesis of C4 on C5. Facet alignment within normal limits. Skull base and vertebrae: Craniovertebral junction is intact. No fracture. Soft tissues and spinal canal: No prevertebral fluid or swelling. No visible canal hematoma. Disc levels: Mild to moderate degenerative changes at C5-C6 and C6-C7. Multilevel bilateral facet arthropathy. Upper chest: Negative Other: Negative IMPRESSION: 1. No CT evidence for acute intracranial abnormality. Periventricular white matter hypodensity which may be due to small vessel change or patient's history of MS. 2. No acute osseous abnormality of the cervical spine. Electronically Signed   By: Donavan Foil M.D.   On: 05/12/2017 03:22   Ct Cervical Spine Wo Contrast  Result Date: 05/12/2017 CLINICAL DATA:  Fall with neck pain EXAM: CT HEAD WITHOUT CONTRAST CT CERVICAL SPINE WITHOUT CONTRAST TECHNIQUE: Multidetector CT imaging of the head and cervical spine was performed following the standard protocol without intravenous contrast. Multiplanar CT image reconstructions of the cervical spine were also generated. COMPARISON:  MRI 09/08/2016, CT head and cervical spine 03/26/2014 FINDINGS: CT HEAD FINDINGS Brain: No acute territorial infarction, hemorrhage or intracranial mass is seen. Moderate periventricular white matter hypodensity. Stable ventricle size. Atrophy. Vascular: No hyperdense vessels. Left vertebral artery calcification. Skull: No fracture or suspicious bone lesion Sinuses/Orbits: Mild mucosal thickening in the maxillary and ethmoid sinuses. No acute orbital  abnormality Other: None CT CERVICAL SPINE FINDINGS Alignment: Trace anterolisthesis of C4 on C5. Facet alignment within normal limits. Skull base and vertebrae: Craniovertebral junction is intact. No fracture. Soft tissues and spinal canal: No prevertebral fluid or swelling. No visible canal hematoma. Disc levels: Mild to moderate degenerative changes at C5-C6 and C6-C7. Multilevel bilateral facet arthropathy. Upper chest: Negative Other: Negative IMPRESSION: 1. No CT evidence for acute intracranial abnormality. Periventricular white matter hypodensity which may be due to small vessel change or patient's history of MS. 2. No acute osseous abnormality of the cervical spine. Electronically Signed  By: Donavan Foil M.D.   On: 05/12/2017 03:22    Procedures Procedures (including critical care time)  Medications Ordered in ED Medications  oxyCODONE-acetaminophen (PERCOCET/ROXICET) 5-325 MG per tablet 1 tablet (1 tablet Oral Given 05/12/17 0321)     Initial Impression / Assessment and Plan / ED Course  I have reviewed the triage vital signs and the nursing notes.  Pertinent labs & imaging results that were available during my care of the patient were reviewed by me and considered in my medical decision making (see chart for details).     Patient is overall well-appearing.  Chest and abdomen are normal.  Discharge home in good condition.  CT head and C-spine without acute rheumatic injury.  L-spine negative.  Primary care follow-up.  Patient understands return to the ER for new or worsening symptoms fall sounds mechanical  Final Clinical Impressions(s) / ED Diagnoses   Final diagnoses:  Fall, initial encounter  Neck pain  Acute midline low back pain without sciatica    New Prescriptions New Prescriptions   No medications on file   I personally performed the services described in this documentation, which was scribed in my presence. The recorded information has been reviewed and is accurate.         Jola Schmidt, MD 05/12/17 [phone]

## 2017-05-12 NOTE — ED Notes (Signed)
Patient transported to X-ray 

## 2017-06-06 ENCOUNTER — Other Ambulatory Visit: Payer: Self-pay | Admitting: Family Medicine

## 2017-06-06 MED ORDER — CALCIUM CARBONATE-VITAMIN D 600-400 MG-UNIT PO TABS: 1.0000 | ORAL_TABLET | Freq: Every day | ORAL | 5 refills | Status: DC

## 2017-06-13 ENCOUNTER — Ambulatory Visit: Payer: Medicare Other | Admitting: Neurology

## 2017-06-22 ENCOUNTER — Encounter (HOSPITAL_COMMUNITY): Payer: Self-pay

## 2017-06-22 ENCOUNTER — Emergency Department (HOSPITAL_COMMUNITY)
Admission: EM | Admit: 2017-06-22 | Discharge: 2017-06-22 | Disposition: A | Discharge status: Home / Self Care | Payer: Medicare Other | Attending: Emergency Medicine | Admitting: Emergency Medicine

## 2017-06-22 DIAGNOSIS — F419 Anxiety disorder, unspecified: Secondary | ICD-10-CM | POA: Diagnosis not present

## 2017-06-22 DIAGNOSIS — Z853 Personal history of malignant neoplasm of breast: Secondary | ICD-10-CM | POA: Insufficient documentation

## 2017-06-22 DIAGNOSIS — N390 Urinary tract infection, site not specified: Secondary | ICD-10-CM | POA: Diagnosis not present

## 2017-06-22 DIAGNOSIS — Z79899 Other long term (current) drug therapy: Secondary | ICD-10-CM | POA: Insufficient documentation

## 2017-06-22 DIAGNOSIS — R200 Anesthesia of skin: Secondary | ICD-10-CM | POA: Diagnosis present

## 2017-06-22 DIAGNOSIS — F329 Major depressive disorder, single episode, unspecified: Secondary | ICD-10-CM | POA: Diagnosis not present

## 2017-06-22 DIAGNOSIS — R202 Paresthesia of skin: Secondary | ICD-10-CM | POA: Diagnosis not present

## 2017-06-22 DIAGNOSIS — Z87891 Personal history of nicotine dependence: Secondary | ICD-10-CM | POA: Diagnosis not present

## 2017-06-22 DIAGNOSIS — Z8669 Personal history of other diseases of the nervous system and sense organs: Secondary | ICD-10-CM

## 2017-06-22 DIAGNOSIS — G35 Multiple sclerosis: Secondary | ICD-10-CM

## 2017-06-22 LAB — CBC
HCT: 34.6 % — ABNORMAL LOW (ref 36.0–46.0)
Hemoglobin: 11.2 g/dL — ABNORMAL LOW (ref 12.0–15.0)
MCH: 29.2 pg (ref 26.0–34.0)
MCHC: 32.4 g/dL (ref 30.0–36.0)
MCV: 90.3 fL (ref 78.0–100.0)
Platelets: 124 10*3/uL — ABNORMAL LOW (ref 150–400)
RBC: 3.83 MIL/uL — ABNORMAL LOW (ref 3.87–5.11)
RDW: 14.5 % (ref 11.5–15.5)
WBC: 3.9 10*3/uL — AB (ref 4.0–10.5)

## 2017-06-22 LAB — BASIC METABOLIC PANEL
ANION GAP: 8 (ref 5–15)
BUN: 16 mg/dL (ref 6–20)
CO2: 23 mmol/L (ref 22–32)
CREATININE: 0.8 mg/dL (ref 0.44–1.00)
Calcium: 8.8 mg/dL — ABNORMAL LOW (ref 8.9–10.3)
Chloride: 108 mmol/L (ref 101–111)
GFR calc Af Amer: >60 mL/min (ref >60)
GLUCOSE: 141 mg/dL — AB (ref 65–99)
Potassium: 4 mmol/L (ref 3.5–5.1)
Sodium: 139 mmol/L (ref 135–145)

## 2017-06-22 LAB — URINALYSIS, ROUTINE W REFLEX MICROSCOPIC
BILIRUBIN URINE: NEGATIVE
Glucose, UA: NEGATIVE mg/dL
HGB URINE DIPSTICK: NEGATIVE
Ketones, ur: NEGATIVE mg/dL
NITRITE: POSITIVE — AB
PROTEIN: NEGATIVE mg/dL
SPECIFIC GRAVITY, URINE: 1.02 (ref 1.005–1.030)
pH: 5 (ref 5.0–8.0)

## 2017-06-22 MED ORDER — TRAMADOL HCL 50 MG PO TABS
50.0000 mg | ORAL_TABLET | Freq: Once | ORAL | Status: AC
  Administered 2017-06-22: 50 mg via ORAL
  Filled 2017-06-22: qty 1

## 2017-06-22 MED ORDER — CEPHALEXIN 500 MG PO CAPS: 500.0000 mg | ORAL_CAPSULE | Freq: Four times a day (QID) | ORAL | 0 refills | Status: DC

## 2017-06-22 MED ORDER — CEPHALEXIN 250 MG PO CAPS
1000.0000 mg | ORAL_CAPSULE | Freq: Once | ORAL | Status: AC
  Administered 2017-06-22: 1000 mg via ORAL
  Filled 2017-06-22: qty 4

## 2017-06-22 NOTE — ED Notes (Signed)
Phlebotomy at bedside.

## 2017-06-22 NOTE — ED Triage Notes (Signed)
Pt presents to the ed with complaints of bilateral hand and feet numbness since Thursday. States it is a flare up of her MS. Pt is alert and oriented. Denies any other symptoms.

## 2017-06-22 NOTE — ED Notes (Signed)
Patient Alert and oriented X4. Stable and ambulatory. Patient verbalized understanding of the discharge instructions.  Patient belongings were taken by the patient.  

## 2017-06-22 NOTE — ED Notes (Signed)
Informed pt. We need urine sample. Pt unable to produce urine at this time

## 2017-06-22 NOTE — ED Provider Notes (Addendum)
[facility] DEPT Provider Note   CSN: [id number] Arrival date & time: 06/22/17  1611     History   Chief Complaint Chief Complaint  Patient presents with  . Foot Pain  . Hand Problem    HPI Stephanie Zhang is a 56 y.o. female.  Patient with hx MS, c/o numbness/tingling to bil hands and feet for the past 2 days. Symptoms gradual onset, constant. No specific exacerbating or alleviating factors. Denies fevers. No unusual stressors. No change in meds. ?mild urinary urgency. No dysuria. No abd or flank pain. Denies cough or uri c/o. No chest pain. No neck/back pain. Pt also c/o dull headache for past couple days. Gradual onset. Comes and goes. C/w prior headaches. No acute/abrupt or severe head pain. Denies trauma or fall.  States called the nurse line and her neurologist office and was told to go to ER for evaluation.    The history is provided by the patient.  Foot Pain  Pertinent negatives include no chest pain, no abdominal pain, no headaches and no shortness of breath.    Past Medical History:  Diagnosis Date  . Anxiety   . Blood transfusion    "when I was born"  . Breast cancer ([place]) 10/2001   Stage II (right side)  . Chronic back pain   . Confusion   . Depression   . Gastroparesis   . GERD (gastroesophageal reflux disease)   . Headache(784.0)    "lots"  . Hypoglycemia   . MS (multiple sclerosis) ([place]) 1987  . Multiple sclerosis   . Osteoporosis   . Short-term memory loss     Patient Active Problem List   Diagnosis Date Noted  . Special screening for malignant neoplasms, colon 04/21/2013  . Gastroparesis 04/18/2012  . Weight loss, unintentional 04/16/2012  . Malnutrition ([place]) 04/16/2012  . MS (multiple sclerosis) ([place])   . Cancer ([place])   . Hypoglycemia   . MULTIPLE SCLEROSIS, RELAPSING/REMITTING 02/16/2010  . WEAKNESS 02/16/2010  . GAIT ATAXIA 02/16/2010  . RECTAL BLEEDING, HX OF 02/13/2010  . BREAST CANCER, HX OF 08/06/2007    Past Surgical  History:  Procedure Laterality Date  . BREAST BIOPSY  2002   right  . BREAST LUMPECTOMY  2002   right  . COLONOSCOPY N/A 04/21/2013   Procedure: COLONOSCOPY;  Surgeon: Ladene Artist, MD;  Location: [facility];  Service: Endoscopy;  Laterality: N/A;    OB History    Gravida Para Term Preterm AB Living   1 0     1 0   SAB TAB Ectopic Multiple Live Births     1             Home Medications    Prior to Admission medications   Medication Sig Start Date End Date Taking? Authorizing Provider  acetaminophen (TYLENOL) 500 MG tablet Take 1,000 mg by mouth every 6 (six) hours as needed for mild pain.    [provider]  amantadine (SYMMETREL) 100 MG capsule Take 100 mg by mouth 2 (two) times daily.    [provider]  buPROPion (WELLBUTRIN) 75 MG tablet Take 2 tablets (150 mg total) by mouth daily. 11/09/16   Marletta Lor, MD  Calcium Carbonate-Vitamin D 600-400 MG-UNIT tablet Take 1 tablet by mouth daily. 06/06/17   Marletta Lor, MD  Cholecalciferol (VITAMIN D) 2000 units CAPS Take 1 capsule (2,000 Units total) by mouth daily. 04/25/17   Pieter Partridge, DO  Cholecalciferol (VITAMIN D) 2000 units tablet  Take 1 tablet (2,000 Units total) by mouth daily. 04/19/17   Pieter Partridge, DO  docusate sodium (COLACE) 100 MG capsule Take 1 capsule (100 mg total) by mouth 2 (two) times daily. 01/03/17   Marletta Lor, MD  famotidine (PEPCID) 20 MG tablet Take 1 tablet (20 mg total) by mouth at bedtime. 10/19/16   Marletta Lor, MD  guaiFENesin (ROBITUSSIN) 100 MG/5ML liquid Take 200 mg by mouth every 6 (six) hours as needed for cough.    [provider]  interferon beta-1a (REBIF) 44 MCG/0.5ML SOSY injection Inject 0.5 mLs (44 mcg total) into the skin 3 (three) times a week. Patient taking differently: Inject 44 mcg into the skin every Monday, Wednesday, and Friday.  02/01/17   Pieter Partridge, DO  loperamide (IMODIUM) 2 MG capsule Take 2 mg by mouth every  4 (four) hours as needed for diarrhea or loose stools.     [provider]  LORazepam (ATIVAN) 0.5 MG tablet Take 1 tablet (0.5 mg total) by mouth daily as needed for anxiety. 01/04/17   Marletta Lor, MD  oxybutynin (DITROPAN) 5 MG tablet Take 1 tablet (5 mg total) by mouth daily. 11/27/16   Marletta Lor, MD  polyethylene glycol [facility] / Floria Raveling) packet Take 17 g by mouth daily. 11/28/16   Marletta Lor, MD  promethazine (PHENERGAN) 25 MG tablet Take 25 mg by mouth every 6 (six) hours as needed for nausea or vomiting.    [provider]  senna (SENOKOT) 8.6 MG TABS tablet Take 1 tablet by mouth daily.    [provider]  Skin Protectants, Misc. (EUCERIN) cream Apply 1 application topically 2 (two) times daily as needed for dry skin.    [provider]    Family History Family History  Problem Relation Age of Onset  . Diabetes Mother   . Cancer Mother        cervical cancer  . Heart disease Mother   . Diabetes Father     Social History Social History  Substance Use Topics  . Smoking status: Former Smoker    Quit date: 05/11/2011  . Smokeless tobacco: Never Used  . Alcohol use No     Comment: "last alcohol 12/04/11"     Allergies   Sulfa antibiotics and Sulfacetamide sodium   Review of Systems Review of Systems  Constitutional: Negative for fever.  HENT: Negative for sore throat.   Eyes: Negative for pain and visual disturbance.  Respiratory: Negative for shortness of breath.   Cardiovascular: Negative for chest pain.  Gastrointestinal: Negative for abdominal pain.  Endocrine: Negative for polyuria.  Genitourinary: Negative for flank pain.  Musculoskeletal: Negative for back pain and neck pain.  Skin: Negative for rash.  Neurological: Positive for numbness. Negative for headaches.  Hematological: Does not bruise/bleed easily.  Psychiatric/Behavioral: Negative for confusion.     Physical Exam Updated Vital  Signs BP (!) 145/92   Pulse 72   Temp (!) 97.2 F (36.2 C) (Oral)   Resp 18   Ht 1.626 m (5\' 4" )   Wt 52.6 kg (116 lb)   LMP 08/09/2008   SpO2 100%   BMI 19.91 kg/m   Physical Exam  Constitutional: She is oriented to person, place, and time. She appears well-developed and well-nourished. No distress.  HENT:  Head: Atraumatic.  Nose: Nose normal.  Mouth/Throat: Oropharynx is clear and moist.  No sinus or temporal tenderness.  Eyes: Pupils are equal, round, and reactive to  light. Conjunctivae and EOM are normal. No scleral icterus.  Neck: Neck supple. No tracheal deviation present. No thyromegaly present.  No stiffness or rigidity.   Cardiovascular: Normal rate, regular rhythm, normal heart sounds and intact distal pulses.  Exam reveals no gallop and no friction rub.   No murmur heard. Pulmonary/Chest: Effort normal and breath sounds normal. No respiratory distress.  Abdominal: Soft. Normal appearance and bowel sounds are normal. She exhibits no distension. There is no tenderness.  Genitourinary:  Genitourinary Comments: No cva tenderness.  Musculoskeletal: Normal range of motion. She exhibits no edema or tenderness.  Neurological: She is alert and oriented to person, place, and time. No cranial nerve deficit.  Speech clear/fluent. Motor intact bilaterally. Stre 5/5. sens to pressure/touch grossly intact. Steady gait.   Skin: Skin is warm and dry. No rash noted. She is not diaphoretic.  Psychiatric: She has a normal mood and affect.  Nursing note and vitals reviewed.    ED Treatments / Results  Labs (all labs ordered are listed, but only abnormal results are displayed) Results for orders placed or performed during the hospital encounter of 06/22/17  Urinalysis, Routine w reflex microscopic  Result Value Ref Range   Color, Urine YELLOW YELLOW   APPearance HAZY (A) CLEAR   Specific Gravity, Urine 1.020 1.005 - 1.030   pH 5.0 5.0 - 8.0   Glucose, UA NEGATIVE NEGATIVE mg/dL     Hgb urine dipstick NEGATIVE NEGATIVE   Bilirubin Urine NEGATIVE NEGATIVE   Ketones, ur NEGATIVE NEGATIVE mg/dL   Protein, ur NEGATIVE NEGATIVE mg/dL   Nitrite POSITIVE (A) NEGATIVE   Leukocytes, UA MODERATE (A) NEGATIVE   RBC / HPF 0-5 0 - 5 RBC/hpf   WBC, UA TOO NUMEROUS TO COUNT 0 - 5 WBC/hpf   Bacteria, UA FEW (A) NONE SEEN   Squamous Epithelial / LPF 0-5 (A) NONE SEEN   Mucous PRESENT   CBC  Result Value Ref Range   WBC 3.9 (L) 4.0 - 10.5 K/uL   RBC 3.83 (L) 3.87 - 5.11 MIL/uL   Hemoglobin 11.2 (L) 12.0 - 15.0 g/dL   HCT 34.6 (L) 36.0 - 46.0 %   MCV 90.3 78.0 - 100.0 fL   MCH 29.2 26.0 - 34.0 pg   MCHC 32.4 30.0 - 36.0 g/dL   RDW 14.5 11.5 - 15.5 %   Platelets 124 (L) 150 - 400 K/uL  Basic metabolic panel  Result Value Ref Range   Sodium 139 135 - 145 mmol/L   Potassium 4.0 3.5 - 5.1 mmol/L   Chloride 108 101 - 111 mmol/L   CO2 23 22 - 32 mmol/L   Glucose, Bld 141 (H) 65 - 99 mg/dL   BUN 16 6 - 20 mg/dL   Creatinine, Ser 0.80 0.44 - 1.00 mg/dL   Calcium 8.8 (L) 8.9 - 10.3 mg/dL   GFR calc non Af Amer >60 >60 mL/min   GFR calc Af Amer >60 >60 mL/min   Anion gap 8 5 - 15    EKG  EKG Interpretation None       Radiology No results found.  Procedures Procedures (including critical care time)  Medications Ordered in ED Medications - No data to display   Initial Impression / Assessment and Plan / ED Course  I have reviewed the triage vital signs and the nursing notes.  Pertinent labs & imaging results that were available during my care of the patient were reviewed by me and considered in my medical decision making (  see chart for details).  Ultram po for pain.  Labs sent.  Neurology consulted - discussed pt with Dr Nicole Kindred, he indicates bil hand/feet tingling does not appear c/w acute ms flare, and that he would not recommend any acute tx for same, recommends to have f/u with her neurologist this week. Will have pt contact office for appt.   Recheck pt  comfortable, nads. Afeb.   uti on labs.  u cx sent.  Pt afeb. No flank pain or tenderness. No nv. Keflex po. Po fluids.  Pt currently appears stable for d/c.     Final Clinical Impressions(s) / ED Diagnoses   Final diagnoses:  None    New Prescriptions New Prescriptions   No medications on file      Lajean Saver, MD 06/22/17 2202

## 2017-06-22 NOTE — Discharge Instructions (Addendum)
It was our pleasure to provide your ER care today - we hope that you feel better.  Rest. Drink adequate fluids.   The lab tests show a urine infection - take antibiotic as prescribed.  Also, similar to prior test results, your platelet count remains low (124) - follow up with your doctor.   You may take acetaminophen and/or ibuprofen as need.   Your blood pressure is high - follow up with primary care doctor in the coming week.   For numbness/tingling, our neurologist recommends that you follow up with your neurologist, closely, this week - call the office Monday morning to arrange appointment.   Return to ER if worse, new symptoms, fevers, vomiting, one-sided numbness/weakness, loss of function, other concern.

## 2017-06-25 LAB — URINE CULTURE: Culture: >=100000 — AB

## 2017-06-26 ENCOUNTER — Telehealth: Payer: Self-pay | Admitting: *Deleted

## 2017-06-26 NOTE — Telephone Encounter (Signed)
Post ED Visit - Positive Culture Follow-up  Culture report reviewed by antimicrobial stewardship pharmacist:  []  Elenor Quinones, Pharm.D. []  Heide Guile, Pharm.D., BCPS AQ-ID []  Parks Neptune, Pharm.D., BCPS [x]  Alycia Rossetti, Pharm.D., BCPS []  [place], Pharm.D., BCPS, AAHIVP []  Legrand Como, Pharm.D., BCPS, AAHIVP []  Salome Arnt, PharmD, BCPS []  Dimitri Ped, PharmD, BCPS []  Vincenza Hews, PharmD, BCPS  Positive urine culture Treated with Cephalexin, organism sensitive to the same and no further patient follow-up is required at this time.  Harlon Flor [facility] 06/26/2017, 10:02 AM

## 2017-07-01 ENCOUNTER — Ambulatory Visit (INDEPENDENT_AMBULATORY_CARE_PROVIDER_SITE_OTHER): Payer: Medicare Other | Admitting: Internal Medicine

## 2017-07-01 ENCOUNTER — Encounter: Payer: Self-pay | Admitting: Internal Medicine

## 2017-07-01 VITALS — BP 132/70 | HR 78 | Temp 97.8°F | Ht 64.0 in | Wt 117.4 lb

## 2017-07-01 DIAGNOSIS — N3000 Acute cystitis without hematuria: Secondary | ICD-10-CM

## 2017-07-01 DIAGNOSIS — G35 Multiple sclerosis: Secondary | ICD-10-CM | POA: Diagnosis not present

## 2017-07-01 NOTE — Patient Instructions (Signed)
Call or return to clinic prn if these symptoms worsen or fail to improve as anticipated.  .  Neurology follow-up as scheduled.

## 2017-07-01 NOTE — Progress Notes (Signed)
Subjective:    Patient ID: Stephanie Zhang, female    DOB: 04/26/1961, 56 y.o.   MRN: [id number]  HPI  56 year old patient who has a history of MS who is followed by neurology. She is seen today following a ED admission for acute UTI.  She has completed antibiotic therapy.  Urine culture revealed Escherichia coli which was pansensitive.  Doing quite well today.  Hospital records reviewed  Past Medical History:  Diagnosis Date  . Anxiety   . Blood transfusion    "when I was born"  . Breast cancer ([place]) 10/2001   Stage II (right side)  . Chronic back pain   . Confusion   . Depression   . Gastroparesis   . GERD (gastroesophageal reflux disease)   . Headache(784.0)    "lots"  . Hypoglycemia   . MS (multiple sclerosis) ([place]) 1987  . Multiple sclerosis   . Osteoporosis   . Short-term memory loss      Social History   Social History  . Marital status: Widowed    Spouse name: N/A  . Number of children: N/A  . Years of education: N/A   Occupational History  . Not on file.   Social History Main Topics  . Smoking status: Former Smoker    Quit date: 05/11/2011  . Smokeless tobacco: Never Used  . Alcohol use No     Comment: "last alcohol 12/04/11"  . Drug use: No     Comment: "last drug use ~ 1980"  . Sexual activity: No   Other Topics Concern  . Not on file   Social History Narrative  . No narrative on file    Past Surgical History:  Procedure Laterality Date  . BREAST BIOPSY  2002   right  . BREAST LUMPECTOMY  2002   right  . COLONOSCOPY N/A 04/21/2013   Procedure: COLONOSCOPY;  Surgeon: Ladene Artist, MD;  Location: [facility];  Service: Endoscopy;  Laterality: N/A;    Family History  Problem Relation Age of Onset  . Diabetes Mother   . Cancer Mother        cervical cancer  . Heart disease Mother   . Diabetes Father     Allergies  Allergen Reactions  . Sulfa Antibiotics Itching and Rash  . Sulfacetamide Sodium Itching and Rash    Current  Outpatient Prescriptions on File Prior to Visit  Medication Sig Dispense Refill  . acetaminophen (TYLENOL) 500 MG tablet Take 1,000 mg by mouth every 6 (six) hours as needed for mild pain.    [name] amantadine (SYMMETREL) 100 MG capsule Take 100 mg by mouth 2 (two) times daily.    [name] buPROPion (WELLBUTRIN) 75 MG tablet Take 2 tablets (150 mg total) by mouth daily. 60 tablet 5  . Calcium Carbonate-Vitamin D 600-400 MG-UNIT tablet Take 1 tablet by mouth daily. 30 tablet 5  . cephALEXin (KEFLEX) 500 MG capsule Take 1 capsule (500 mg total) by mouth 4 (four) times daily. 20 capsule 0  . Cholecalciferol (VITAMIN D) 2000 units CAPS Take 1 capsule (2,000 Units total) by mouth daily. 90 capsule 1  . Cholecalciferol (VITAMIN D) 2000 units tablet Take 1 tablet (2,000 Units total) by mouth daily. 90 tablet 3  . docusate sodium (COLACE) 100 MG capsule Take 1 capsule (100 mg total) by mouth 2 (two) times daily. 180 capsule 1  . famotidine (PEPCID) 20 MG tablet Take 1 tablet (20 mg total) by mouth at bedtime. 30 tablet 11  .  guaiFENesin (ROBITUSSIN) 100 MG/5ML liquid Take 200 mg by mouth every 6 (six) hours as needed for cough.    . interferon beta-1a (REBIF) 44 MCG/0.5ML SOSY injection Inject 0.5 mLs (44 mcg total) into the skin 3 (three) times a week. (Patient taking differently: Inject 44 mcg into the skin every Monday, Wednesday, and Friday. ) 0.5 mL 12  . loperamide (IMODIUM) 2 MG capsule Take 2 mg by mouth every 4 (four) hours as needed for diarrhea or loose stools.     [name] LORazepam (ATIVAN) 0.5 MG tablet Take 1 tablet (0.5 mg total) by mouth daily as needed for anxiety. 60 tablet 0  . oxybutynin (DITROPAN) 5 MG tablet Take 1 tablet (5 mg total) by mouth daily. 90 tablet 3  . polyethylene glycol (MIRALAX / GLYCOLAX) packet Take 17 g by mouth daily. 14 each 11  . promethazine (PHENERGAN) 25 MG tablet Take 25 mg by mouth every 6 (six) hours as needed for nausea or vomiting.    . senna (SENOKOT) 8.6 MG TABS tablet  Take 1 tablet by mouth daily.    . Skin Protectants, Misc. (EUCERIN) cream Apply 1 application topically 2 (two) times daily as needed for dry skin.     No current facility-administered medications on file prior to visit.     BP 132/70 (BP Location: Left Arm, Patient Position: Sitting, Cuff Size: Normal)   Pulse 78   Temp 97.8 F (36.6 C) (Oral)   Ht 5\' 4"  (1.626 m)   Wt 117 lb 6.4 oz (53.3 kg)   LMP 08/09/2008   SpO2 98%   BMI 20.15 kg/m     Review of Systems  HENT: Negative for congestion, dental problem, hearing loss, rhinorrhea, sinus pressure, sore throat and tinnitus.   Eyes: Negative for pain, discharge and visual disturbance.  Respiratory: Negative for cough and shortness of breath.   Cardiovascular: Negative for chest pain, palpitations and leg swelling.  Gastrointestinal: Negative for abdominal distention, abdominal pain, blood in stool, constipation, diarrhea, nausea and vomiting.  Genitourinary: Negative for difficulty urinating, dysuria, flank pain, frequency, hematuria, pelvic pain, urgency, vaginal bleeding, vaginal discharge and vaginal pain.  Musculoskeletal: Positive for gait problem. Negative for arthralgias and joint swelling.  Skin: Negative for rash.  Neurological: Positive for weakness and numbness. Negative for dizziness, syncope, speech difficulty and headaches.  Hematological: Negative for adenopathy.  Psychiatric/Behavioral: Negative for agitation, behavioral problems and dysphoric mood. The patient is not nervous/anxious.        Objective:   Physical Exam  Constitutional: She is oriented to person, place, and time. She appears well-developed and well-nourished.  HENT:  Head: Normocephalic.  Right Ear: External ear normal.  Left Ear: External ear normal.  Mouth/Throat: Oropharynx is clear and moist.  Eyes: Pupils are equal, round, and reactive to light. Conjunctivae and EOM are normal.  Neck: Normal range of motion. Neck supple. No thyromegaly  present.  Cardiovascular: Normal rate, regular rhythm, normal heart sounds and intact distal pulses.   Pulmonary/Chest: Effort normal and breath sounds normal.  Abdominal: Soft. Bowel sounds are normal. She exhibits no mass. There is no tenderness.  Musculoskeletal: Normal range of motion.  Lymphadenopathy:    She has no cervical adenopathy.  Neurological: She is alert and oriented to person, place, and time.  Skin: Skin is warm and dry. No rash noted.  Psychiatric: She has a normal mood and affect. Her behavior is normal.          Assessment & Plan:   Status  post UTI.  Patient has completed antibiotic therapy.  Remains asymptomatic MS.  Follow-up neurology  No change in present regimen  Follow-up 6 months  Akyia Borelli Pilar Plate

## 2017-07-15 ENCOUNTER — Ambulatory Visit: Payer: Medicare Other | Admitting: Internal Medicine

## 2017-07-17 ENCOUNTER — Other Ambulatory Visit: Payer: Self-pay | Admitting: Internal Medicine

## 2017-07-17 MED ORDER — POLYETHYLENE GLYCOL 3350 17 G PO PACK: 17.0000 g | PACK | Freq: Every day | ORAL | 11 refills | Status: DC

## 2017-07-22 ENCOUNTER — Encounter: Payer: Self-pay | Admitting: Internal Medicine

## 2017-07-22 ENCOUNTER — Ambulatory Visit (INDEPENDENT_AMBULATORY_CARE_PROVIDER_SITE_OTHER): Payer: Medicare Other | Admitting: Internal Medicine

## 2017-07-22 VITALS — BP 120/80 | HR 93 | Temp 97.7°F | Ht 64.0 in | Wt 119.8 lb

## 2017-07-22 DIAGNOSIS — L720 Epidermal cyst: Secondary | ICD-10-CM | POA: Diagnosis not present

## 2017-07-22 NOTE — Progress Notes (Signed)
Subjective:    Patient ID: Stephanie Zhang, female    DOB: [birth year], 57 y.o.   MRN: [id number]  HPI  56 year old patient who presents with a two-week history of some discomfort in the right hip area.  Pain has originated from an area of redness.  This nodule has drained spontaneously and pain is much improved.  Pain is aggravated by pressure.  No pain with weightbearing.  No fever  Past Medical History:  Diagnosis Date  . Anxiety   . Blood transfusion    "when I was born"  . Breast cancer ([place]) 10/2001   Stage II (right side)  . Chronic back pain   . Confusion   . Depression   . Gastroparesis   . GERD (gastroesophageal reflux disease)   . Headache(784.0)    "lots"  . Hypoglycemia   . MS (multiple sclerosis) ([place]) 1987  . Multiple sclerosis   . Osteoporosis   . Short-term memory loss      Social History   Social History  . Marital status: Widowed    Spouse name: N/A  . Number of children: N/A  . Years of education: N/A   Occupational History  . Not on file.   Social History Main Topics  . Smoking status: Former Smoker    Quit date: 05/11/2011  . Smokeless tobacco: Never Used  . Alcohol use No     Comment: "last alcohol 12/04/11"  . Drug use: No     Comment: "last drug use ~ 1980"  . Sexual activity: No   Other Topics Concern  . Not on file   Social History Narrative  . No narrative on file    Past Surgical History:  Procedure Laterality Date  . BREAST BIOPSY  2002   right  . BREAST LUMPECTOMY  2002   right  . COLONOSCOPY N/A 04/21/2013   Procedure: COLONOSCOPY;  Surgeon: Ladene Artist, MD;  Location: [facility];  Service: Endoscopy;  Laterality: N/A;    Family History  Problem Relation Age of Onset  . Diabetes Mother   . Cancer Mother        cervical cancer  . Heart disease Mother   . Diabetes Father     Allergies  Allergen Reactions  . Sulfa Antibiotics Itching and Rash  . Sulfacetamide Sodium Itching and Rash    Current Outpatient  Prescriptions on File Prior to Visit  Medication Sig Dispense Refill  . acetaminophen (TYLENOL) 500 MG tablet Take 1,000 mg by mouth every 6 (six) hours as needed for mild pain.    [name] amantadine (SYMMETREL) 100 MG capsule Take 100 mg by mouth 2 (two) times daily.    [name] buPROPion (WELLBUTRIN) 75 MG tablet Take 2 tablets (150 mg total) by mouth daily. 60 tablet 5  . Calcium Carbonate-Vitamin D 600-400 MG-UNIT tablet Take 1 tablet by mouth daily. 30 tablet 5  . Cholecalciferol (VITAMIN D) 2000 units tablet Take 1 tablet (2,000 Units total) by mouth daily. 90 tablet 3  . docusate sodium (COLACE) 100 MG capsule Take 1 capsule (100 mg total) by mouth 2 (two) times daily. 180 capsule 1  . famotidine (PEPCID) 20 MG tablet Take 1 tablet (20 mg total) by mouth at bedtime. 30 tablet 11  . guaiFENesin (ROBITUSSIN) 100 MG/5ML liquid Take 200 mg by mouth every 6 (six) hours as needed for cough.    . interferon beta-1a (REBIF) 44 MCG/0.5ML SOSY injection Inject 0.5 mLs (44 mcg total) into the skin 3 (three)  times a week. (Patient taking differently: Inject 44 mcg into the skin every Monday, Wednesday, and Friday. ) 0.5 mL 12  . loperamide (IMODIUM) 2 MG capsule Take 2 mg by mouth every 4 (four) hours as needed for diarrhea or loose stools.     [name] LORazepam (ATIVAN) 0.5 MG tablet Take 1 tablet (0.5 mg total) by mouth daily as needed for anxiety. 60 tablet 0  . oxybutynin (DITROPAN) 5 MG tablet Take 1 tablet (5 mg total) by mouth daily. 90 tablet 3  . polyethylene glycol (MIRALAX / GLYCOLAX) packet Take 17 g by mouth daily. 100 each 11  . promethazine (PHENERGAN) 25 MG tablet Take 25 mg by mouth every 6 (six) hours as needed for nausea or vomiting.    . Skin Protectants, Misc. (EUCERIN) cream Apply 1 application topically 2 (two) times daily as needed for dry skin.     No current facility-administered medications on file prior to visit.     BP 120/80 (BP Location: Left Arm, Patient Position: Sitting, Cuff Size:  Normal)   Pulse 93   Temp 97.7 F (36.5 C) (Oral)   Ht 5\' 4"  (1.626 m)   Wt 119 lb 12.8 oz (54.3 kg)   LMP 08/09/2008   SpO2 98%   BMI 20.56 kg/m     Review of Systems  Constitutional: Negative.   HENT: Negative for congestion, dental problem, hearing loss, rhinorrhea, sinus pressure, sore throat and tinnitus.   Eyes: Negative for pain, discharge and visual disturbance.  Respiratory: Negative for cough and shortness of breath.   Cardiovascular: Negative for chest pain, palpitations and leg swelling.  Gastrointestinal: Negative for abdominal distention, abdominal pain, blood in stool, constipation, diarrhea, nausea and vomiting.  Genitourinary: Negative for difficulty urinating, dysuria, flank pain, frequency, hematuria, pelvic pain, urgency, vaginal bleeding, vaginal discharge and vaginal pain.  Musculoskeletal: Negative for arthralgias, gait problem and joint swelling.  Skin: Positive for wound. Negative for rash.  Neurological: Negative for dizziness, syncope, speech difficulty, weakness, numbness and headaches.  Hematological: Negative for adenopathy.  Psychiatric/Behavioral: Negative for agitation, behavioral problems and dysphoric mood. The patient is not nervous/anxious.        Objective:   Physical Exam  Constitutional: She appears well-developed and well-nourished. No distress.  Musculoskeletal:  No local tenderness Full range of motion.  The right hip No pain with weightbearing  Skin:  8-10 mm, nonfluctuant nodule with very minimal erythema.  Mild postinflammatory scaling No real tenderness          Assessment & Plan:   Inflamed sebaceous cyst right buttock area.  This has larger resolved after spontaneous drainage.  Local wound care discussed  Return as scheduled for follow-up  Nyoka Cowden

## 2017-07-22 NOTE — Patient Instructions (Addendum)
Epidermal Cyst An epidermal cyst is a small, painless lump under your skin. It may be called an epidermal inclusion cyst or an infundibular cyst. The cyst contains a grayish-white, bad-smelling substance (keratin). It is important not to pop epidermal cysts yourself. These cysts are usually harmless (benign), but they can get infected. Symptoms of infection may include:  Redness.  Inflammation.  Tenderness.  Warmth.  Fever.  A grayish-white, bad-smelling substance draining from the cyst.  Pus draining from the cyst.  Follow these instructions at home:  Take over-the-counter and prescription medicines only as told by your doctor.  If you were prescribed an antibiotic, use it as told by your doctor. Do not stop using the antibiotic even if you start to feel better.  Keep the area around your cyst clean and dry.  Wear loose, dry clothing.  Do not try to pop your cyst.  Avoid touching your cyst.  Check your cyst every day for signs of infection.  Keep all follow-up visits as told by your doctor. This is important. How is this prevented?  Wear clean, dry, clothing.  Avoid wearing tight clothing.  Keep your skin clean and dry. Shower or take baths every day.  Wash your body with a benzoyl peroxide wash when you shower or bathe. Contact a health care provider if:  Your cyst has symptoms of infection.  Your condition is not improving or is getting worse.  You have a cyst that looks different from other cysts you have had.  You have a fever. Get help right away if:  Redness spreads from the cyst into the surrounding area. This information is not intended to replace advice given to you by your health care provider. Make sure you discuss any questions you have with your health care provider. Document Released: 01/03/2005 Document Revised: 07/25/2016 Document Reviewed: 09/28/2015 Elsevier Interactive Patient Education  2018 Elsevier Inc.  

## 2017-08-08 NOTE — Progress Notes (Signed)
Rcvd rqst from [facility] @ [facility]. Pt displaying strange behavior, rqsting order for U/A. Dr Tomi Likens signed, faxed to [phone]

## 2017-10-09 ENCOUNTER — Other Ambulatory Visit: Payer: Self-pay | Admitting: Internal Medicine

## 2017-10-09 MED ORDER — FAMOTIDINE 20 MG PO TABS: 20.0000 mg | ORAL_TABLET | Freq: Every day | ORAL | 11 refills | Status: DC

## 2017-10-17 ENCOUNTER — Other Ambulatory Visit: Payer: Medicare Other

## 2017-10-17 ENCOUNTER — Ambulatory Visit (INDEPENDENT_AMBULATORY_CARE_PROVIDER_SITE_OTHER): Payer: Medicare Other | Admitting: Neurology

## 2017-10-17 ENCOUNTER — Encounter: Payer: Self-pay | Admitting: Neurology

## 2017-10-17 VITALS — BP 144/88 | HR 92 | Ht 64.0 in | Wt 123.8 lb

## 2017-10-17 DIAGNOSIS — G35 Multiple sclerosis: Secondary | ICD-10-CM

## 2017-10-17 DIAGNOSIS — D320 Benign neoplasm of cerebral meninges: Secondary | ICD-10-CM | POA: Diagnosis not present

## 2017-10-17 DIAGNOSIS — R030 Elevated blood-pressure reading, without diagnosis of hypertension: Secondary | ICD-10-CM | POA: Diagnosis not present

## 2017-10-17 LAB — HEPATIC FUNCTION PANEL
AG RATIO: 1.4 (calc) (ref 1.0–2.5)
ALBUMIN MSPROF: 3.3 g/dL — AB (ref 3.6–5.1)
ALT: 12 U/L (ref 6–29)
AST: 16 U/L (ref 10–35)
Alkaline phosphatase (APISO): 115 U/L (ref 33–130)
BILIRUBIN TOTAL: 0.6 mg/dL (ref 0.2–1.2)
Bilirubin, Direct: 0.1 mg/dL (ref 0.0–0.2)
GLOBULIN: 2.3 g/dL (ref 1.9–3.7)
Indirect Bilirubin: 0.5 mg/dL (calc) (ref 0.2–1.2)
Total Protein: 5.6 g/dL — ABNORMAL LOW (ref 6.1–8.1)

## 2017-10-17 NOTE — Progress Notes (Signed)
NEUROLOGY FOLLOW UP OFFICE NOTE  Stephanie Zhang [id number]  HISTORY OF PRESENT ILLNESS: Stephanie "Lelan Pons" Pain is a 56 year old female with chronic back pain, osteoporosis, GERD, thrombocytopenia, and history of breast cancer with secondary progressive multiple sclerosis who follows up for hallucinations.  She is accompanied by her sister who supplements history.   UPDATE: She is taking Rebif for disease modifying therapy She is taking bupropion 75mg  daily for depression She is taking oxybutynin for neurogenic bladder She is taking lorazepam 0.5mg  as needed for anxiety. Amantadine was discontinued.   Last visit, she reported visual and auditory hallucinations since her husband passed away in 20-Jan-2023.  MRI of brain was ordered but never performed.  She fell on 05/11/17.  She was pulling up her underwear after using the toilet and the next thing she remembered was being on the floor.  She presented to the ED the following day for neck pain.  CT of head and cervical spine revealed no acute changes.  She presented to the ED on 06/22/17 for 2 day history of numbness and tingling of the hands and feet.  She was found to have a UTI with positive culture for E coli and was subsequently treated with antibiotics.  She still has occasional hallucinations, such as seeing cats or seeing her ex-husband who passed away.  However, they are much improved.  Vitamin D level was 39.  She was advised to start D3 2000 IU daily.  She did not have CBC with diff and  hepatic panel as requested.    HISTORY: She was diagnosed with multiple sclerosis at around age 86.  She reports left sided numbness, ataxia, and visual disturbance.  She had bilateral INO.  She did undergo a lumbar puncture at the time.[name]   She was initially on Avonex, which was switched to Rebif in 2004 due to change in insurance. She has required treatment of flare-ups with Solu-Medrol, usually presenting as worsening gait with falls.  As per prior  neurologist's notes, her last steroid treatment was in 2010.  She has had a progressive physical decline since then.  Her ex-husband passed away on 2017-01-15.  Since then, she reports visual and auditory hallucinations.  In the middle of the night, she may see and hear her ex-husband outside the window.  On at least one occasion, she woke up in the middle of the night and heard knocking on her window.  She saw somebody outside speaking [place].  She told the nurses and they didn't see anyone.  One afternoon, she was walking in the courtyard and saw a [place].  Her friend didn't see anything.  Later, when she was in the dining room, she saw the [place] outside the window.  Sometimes, these hallucinations are frighthening.  They occur 2 to 3 times a week.     She currently lives in assisted living at [facility].  At baseline, she exhibits extra-pyramidal symptoms such as tremor.  She has memory deficits.  She requires use of a walker since 2005.  She has neurogenic bladder.  She needs to push down on her stomach in order to help void.  She sometimes has constipation or diarrhea.   MRI of brain with and without contrast from 09/08/16 reports "no change in the appearance of numerous subcortical, periventricular, and deep white matter T2 hyperintense lesions" when compared to prior imaging from 10/23/08.  However, it also reports mild interval enlargement of a meningioma "in the left frontal dura which measures 5 x  11 mm, previously 2 x 10 mm."  She was referred to neurosurgery for evaluation of meningioma.  Continued monitoring was recommended.  PAST MEDICAL HISTORY: Past Medical History:  Diagnosis Date  . Anxiety   . Blood transfusion    "when I was born"  . Breast cancer ([place]) 10/2001   Stage II (right side)  . Chronic back pain   . Confusion   . Depression   . Gastroparesis   . GERD (gastroesophageal reflux disease)   . Headache(784.0)    "lots"  . Hypoglycemia   . MS (multiple sclerosis) ([place])  1987  . Multiple sclerosis   . Osteoporosis   . Short-term memory loss     MEDICATIONS: Current Outpatient Medications on File Prior to Visit  Medication Sig Dispense Refill  . acetaminophen (TYLENOL) 500 MG tablet Take 1,000 mg by mouth every 6 (six) hours as needed for mild pain.    [name] amantadine (SYMMETREL) 100 MG capsule Take 100 mg by mouth 2 (two) times daily.    [name] buPROPion (WELLBUTRIN) 75 MG tablet Take 2 tablets (150 mg total) by mouth daily. 60 tablet 5  . Calcium Carbonate-Vitamin D 600-400 MG-UNIT tablet Take 1 tablet by mouth daily. 30 tablet 5  . Cholecalciferol (VITAMIN D) 2000 units tablet Take 1 tablet (2,000 Units total) by mouth daily. 90 tablet 3  . docusate sodium (COLACE) 100 MG capsule Take 1 capsule (100 mg total) by mouth 2 (two) times daily. 180 capsule 1  . famotidine (PEPCID) 20 MG tablet Take 1 tablet (20 mg total) by mouth at bedtime. 30 tablet 11  . guaiFENesin (ROBITUSSIN) 100 MG/5ML liquid Take 200 mg by mouth every 6 (six) hours as needed for cough.    . interferon beta-1a (REBIF) 44 MCG/0.5ML SOSY injection Inject 0.5 mLs (44 mcg total) into the skin 3 (three) times a week. (Patient taking differently: Inject 44 mcg into the skin every Monday, Wednesday, and Friday. ) 0.5 mL 12  . loperamide (IMODIUM) 2 MG capsule Take 2 mg by mouth every 4 (four) hours as needed for diarrhea or loose stools.     [name] LORazepam (ATIVAN) 0.5 MG tablet Take 1 tablet (0.5 mg total) by mouth daily as needed for anxiety. 60 tablet 0  . oxybutynin (DITROPAN) 5 MG tablet Take 1 tablet (5 mg total) by mouth daily. 90 tablet 3  . polyethylene glycol (MIRALAX / GLYCOLAX) packet Take 17 g by mouth daily. 100 each 11  . promethazine (PHENERGAN) 25 MG tablet Take 25 mg by mouth every 6 (six) hours as needed for nausea or vomiting.    . Skin Protectants, Misc. (EUCERIN) cream Apply 1 application topically 2 (two) times daily as needed for dry skin.     No current facility-administered  medications on file prior to visit.     ALLERGIES: Allergies  Allergen Reactions  . Amantadines   . Sulfa Antibiotics Itching and Rash  . Sulfacetamide Sodium Itching and Rash    FAMILY HISTORY: Family History  Problem Relation Age of Onset  . Diabetes Mother   . Cancer Mother        cervical cancer  . Heart disease Mother   . Diabetes Father     SOCIAL HISTORY: Social History   Socioeconomic History  . Marital status: Widowed    Spouse name: Not on file  . Number of children: Not on file  . Years of education: Not on file  . Highest education level: Not on file  Social  Needs  . Financial resource strain: Not on file  . Food insecurity - worry: Not on file  . Food insecurity - inability: Not on file  . Transportation needs - medical: Not on file  . Transportation needs - non-medical: Not on file  Occupational History  . Not on file  Tobacco Use  . Smoking status: Former Smoker    Last attempt to quit: 05/11/2011    Years since quitting: 6.4  . Smokeless tobacco: Never Used  Substance and Sexual Activity  . Alcohol use: No    Comment: "last alcohol 12/04/11"  . Drug use: No    Comment: "last drug use ~ 1980"  . Sexual activity: No  Other Topics Concern  . Not on file  Social History Narrative  . Not on file    REVIEW OF SYSTEMS: Constitutional: No fevers, chills, or sweats, no generalized fatigue, change in appetite Eyes: No visual changes, double vision, eye pain Ear, nose and throat: No hearing loss, ear pain, nasal congestion, sore throat Cardiovascular: No chest pain, palpitations Respiratory:  No shortness of breath at rest or with exertion, wheezes GastrointestinaI: No nausea, vomiting, diarrhea, abdominal pain, fecal incontinence Genitourinary:  No dysuria, urinary retention or frequency Musculoskeletal:  No neck pain, back pain Integumentary: No rash, pruritus, skin lesions Neurological: as above Psychiatric: depression Endocrine: No  palpitations, fatigue, diaphoresis, mood swings, change in appetite, change in weight, increased thirst Hematologic/Lymphatic:  No purpura, petechiae. Allergic/Immunologic: no itchy/runny eyes, nasal congestion, recent allergic reactions, rashes  PHYSICAL EXAM: Vitals:   10/17/17 1107  BP: (!) 144/88  Pulse: 92  SpO2: 94%   General: No acute distress.   Head:  Normocephalic/atraumatic Eyes:  Fundi examined but not visualized Neck: supple, no paraspinal tenderness, full range of motion Heart:  Regular rate and rhythm Lungs:  Clear to auscultation bilaterally Back: No paraspinal tenderness Neurological Exam: alert and oriented to person, place, and time. Attention span and concentration intact, recent and remote memory intact, fund of knowledge intact.  Speech fluent and not dysarthric, language intact.  Bilateral internuclear ophthalmoplegia.  Otherwise, CN II-XII intact. Bulk and tone normal, muscle strength 5/5 throughout.  Sensation to light touch intact.  Deep tendon reflexes 2+ throughout, toes upgoing.  Finger to nose testing with bilateral tremor and dysmetria and heel to shin testing with dysmetria  Gait not checked.  IMPRESSION: 1.  Secondary progressive multiple sclerosis 2.  Cerebral aneurysm. 3.  Hallucinations:  May be related to underlying depression and mild cognitive impairment.  Possibly improved after treatment of UTI and discontinuation of amantadine. 4.  Elevated blood pressure.  PLAN: 1.  Continue Rebif and vitamin D3 2000 IU 2.  We will check MRI of brain with and without contrast to follow up on meningioma 3.  Check CBC with diff and hepatic panel today and repeat again in 6 months prior to follow up. 4.  Follow up with Dr. Raliegh Ip or have staff at [facility] recheck blood pressure. 5.  Follow up in 6 months.  25 minutes spent face to face with patient, over 50% spent discussing management.  Metta Clines, DO  CC:  Dr. Burnice Logan

## 2017-10-17 NOTE — Patient Instructions (Signed)
1.  Continue Rebif and vitamin D3 2000 IU 2.  We will check MRI of brain with and without contrast 3.  Check CBC with diff and hepatic panel today and repeat again in 6 months prior to follow up. 4.  Follow up in 6 months.

## 2017-10-18 LAB — CBC WITH DIFFERENTIAL
BASOS ABS: 0 10*3/uL (ref 0.0–0.2)
Basos: 0 %
EOS (ABSOLUTE): 0.2 10*3/uL (ref 0.0–0.4)
Eos: 4 %
Hematocrit: 35 % (ref 34.0–46.6)
Hemoglobin: 11.2 g/dL (ref 11.1–15.9)
Immature Grans (Abs): 0 10*3/uL (ref 0.0–0.1)
Immature Granulocytes: 0 %
LYMPHS: 27 %
Lymphocytes Absolute: 1.3 10*3/uL (ref 0.7–3.1)
MCH: 28.9 pg (ref 26.6–33.0)
MCHC: 32 g/dL (ref 31.5–35.7)
MCV: 90 fL (ref 79–97)
MONOCYTES: 17 %
Monocytes Absolute: 0.8 10*3/uL (ref 0.1–0.9)
NEUTROS PCT: 52 %
Neutrophils Absolute: 2.4 10*3/uL (ref 1.4–7.0)
RBC: 3.87 x10E6/uL (ref 3.77–5.28)
RDW: 14.4 % (ref 12.3–15.4)
WBC: 4.7 10*3/uL (ref 3.4–10.8)

## 2017-10-18 LAB — SPECIMEN STATUS REPORT

## 2017-10-22 ENCOUNTER — Ambulatory Visit
Admission: RE | Admit: 2017-10-22 | Discharge: 2017-10-22 | Disposition: A | Discharge status: Home / Self Care | Payer: Medicare Other | Source: Ambulatory Visit | Attending: Neurology | Admitting: Neurology

## 2017-10-22 DIAGNOSIS — G35 Multiple sclerosis: Secondary | ICD-10-CM | POA: Diagnosis not present

## 2017-10-22 DIAGNOSIS — D320 Benign neoplasm of cerebral meninges: Secondary | ICD-10-CM

## 2017-10-22 MED ORDER — GADOBENATE DIMEGLUMINE 529 MG/ML IV SOLN
10.0000 mL | Freq: Once | INTRAVENOUS | Status: AC | PRN
  Administered 2017-10-22: 10 mL via INTRAVENOUS

## 2017-10-23 DIAGNOSIS — Z23 Encounter for immunization: Secondary | ICD-10-CM | POA: Diagnosis not present

## 2017-10-24 ENCOUNTER — Telehealth: Payer: Self-pay

## 2017-10-24 NOTE — Telephone Encounter (Signed)
-----   Message from Pieter Partridge, DO sent at 10/23/2017  6:59 AM EST ----- MS and meningioma look stable.  Nothing new or concerning.

## 2017-10-24 NOTE — Telephone Encounter (Signed)
LM on VM for Pt to rtrn call concerning imaging results

## 2017-10-29 NOTE — Telephone Encounter (Signed)
LM on Pts VM for rtrn call

## 2017-11-04 ENCOUNTER — Telehealth: Payer: Self-pay

## 2017-11-04 NOTE — Telephone Encounter (Signed)
3rd message left for Pt to rtrn call re: MRI results

## 2017-11-04 NOTE — Telephone Encounter (Signed)
-----   Message from Pieter Partridge, DO sent at 10/23/2017  6:59 AM EST ----- MS and meningioma look stable.  Nothing new or concerning.

## 2017-11-05 DIAGNOSIS — N319 Neuromuscular dysfunction of bladder, unspecified: Secondary | ICD-10-CM | POA: Diagnosis not present

## 2017-11-05 DIAGNOSIS — R8271 Bacteriuria: Secondary | ICD-10-CM | POA: Diagnosis not present

## 2017-11-14 ENCOUNTER — Other Ambulatory Visit: Payer: Self-pay | Admitting: Neurology

## 2017-12-13 ENCOUNTER — Other Ambulatory Visit: Payer: Self-pay | Admitting: Neurology

## 2017-12-17 ENCOUNTER — Telehealth: Payer: Self-pay | Admitting: Neurology

## 2017-12-17 NOTE — Telephone Encounter (Signed)
[facility] left a voicemail m,essage saying they needed a prior auth for a medication for pt but did not say the name of the medication CB# [phone]

## 2017-12-23 ENCOUNTER — Other Ambulatory Visit: Payer: Self-pay | Admitting: Internal Medicine

## 2017-12-23 MED ORDER — LORAZEPAM 0.5 MG PO TABS: 0.5000 mg | ORAL_TABLET | Freq: Every day | ORAL | 3 refills | Status: DC | PRN

## 2018-01-20 ENCOUNTER — Ambulatory Visit (INDEPENDENT_AMBULATORY_CARE_PROVIDER_SITE_OTHER): Payer: Medicare Other | Admitting: Family Medicine

## 2018-01-20 ENCOUNTER — Encounter: Payer: Self-pay | Admitting: Family Medicine

## 2018-01-20 VITALS — BP 160/100 | HR 78 | Temp 98.4°F | Wt 123.3 lb

## 2018-01-20 DIAGNOSIS — B029 Zoster without complications: Secondary | ICD-10-CM

## 2018-01-20 DIAGNOSIS — I10 Essential (primary) hypertension: Secondary | ICD-10-CM

## 2018-01-20 MED ORDER — HYDROCORTISONE 1 % EX CREA: TOPICAL_CREAM | CUTANEOUS | 0 refills | Status: AC

## 2018-01-20 MED ORDER — LISINOPRIL 5 MG PO TABS: 5.0000 mg | ORAL_TABLET | Freq: Every day | ORAL | 3 refills | Status: DC

## 2018-01-20 NOTE — Progress Notes (Signed)
Subjective:    Patient ID: Stephanie Zhang, female    DOB: 1961-03-26, 57 y.o.   MRN: [id number]  Chief Complaint  Patient presents with  . Rash  Patient is accompanied by her sister.  HPI Patient was seen today for acute concern.  Patient endorses rash along right back and right abdomen times 2 weeks.  Patient describes rash as itchy, states may burn on occassion.  Patient is unsure if there were vesicles present on the rash first started.  Patient does have a history of chickenpox.  Patient sister recently had shingles.  Of note both patient and sister expressed concern over patient blood pressure.  They state it has been elevated at the facility for a while now.  Patient is not currently on medication for HTN  Past Medical History:  Diagnosis Date  . Anxiety   . Blood transfusion    "when I was born"  . Breast cancer ([place]) 10/2001   Stage II (right side)  . Chronic back pain   . Confusion   . Depression   . Gastroparesis   . GERD (gastroesophageal reflux disease)   . Headache(784.0)    "lots"  . Hypoglycemia   . MS (multiple sclerosis) ([place]) 1987  . Multiple sclerosis   . Osteoporosis   . Short-term memory loss     Allergies  Allergen Reactions  . Amantadines   . Sulfa Antibiotics Itching and Rash  . Sulfacetamide Sodium Itching and Rash    ROS General: Denies fever, chills, night sweats, changes in weight, changes in appetite HEENT: Denies headaches, ear pain, changes in vision, rhinorrhea, sore throat CV: Denies CP, palpitations, SOB, orthopnea Pulm: Denies SOB, cough, wheezing GI: Denies abdominal pain, nausea, vomiting, diarrhea, constipation GU: Denies dysuria, hematuria, frequency, vaginal discharge Msk: Denies muscle cramps, joint pains Neuro: Denies weakness, numbness, tingling Skin: Denies bruising   +rash on abdomen and back. Psych: Denies depression, anxiety, hallucinations     Objective:    Blood pressure (!) 160/100, pulse 78, temperature 98.4 F  (36.9 C), temperature source Oral, weight 123 lb 4.8 oz (55.9 kg), last menstrual period 08/09/2008.   Gen. Pleasant, well-nourished, in no distress, normal affect   HEENT: [place]/AT, face symmetric, conjunctiva clear, no scleral icterus, PERRLA, nares patent without drainage Lungs: no accessory muscle use, CTAB, no wheezes or rales Cardiovascular: RRR, no m/r/g, no peripheral edema Abdomen: BS present, soft, NT/ND Neuro:  A&Ox3, CN II-XII intact, using a walker for ambulation Skin:  Warm, dry and intact.  Patient has several small, round, crusted dry lesions on right back below bra line and right lower abdomen.  Mild excoriation along the right ankle with healing scratch marks   Wt Readings from Last 3 Encounters:  01/20/18 123 lb 4.8 oz (55.9 kg)  10/17/17 123 lb 12.8 oz (56.2 kg)  07/22/17 119 lb 12.8 oz (54.3 kg)    Lab Results  Component Value Date   WBC 4.7 10/17/2017   HGB 11.2 10/17/2017   HCT 35.0 10/17/2017   PLT 124 (L) 06/22/2017   GLUCOSE 141 (H) 06/22/2017   CHOL 153 12/14/2016   TRIG 92.0 12/14/2016   HDL 62.80 12/14/2016   LDLCALC 72 12/14/2016   ALT 12 10/17/2017   AST 16 10/17/2017   NA 139 06/22/2017   K 4.0 06/22/2017   CL 108 06/22/2017   CREATININE 0.80 06/22/2017   BUN 16 06/22/2017   CO2 23 06/22/2017   TSH 2.25 04/12/2017    Assessment/Plan:  Herpes zoster  without complication -We will not prescribe antiviral at this time as rash is healing and mostly crusted over. -Consider lidocaine patch patient has pain/burning. -We will use cortisone cream at this time for itching  - Plan: hydrocortisone cream 1 %  Essential hypertension -Given numerous elevated BP readings at facility patient resides at an elevated readings here in office will start low-dose of lisinopril. -Patient advised to follow-up in the next 2-4 weeks for BP. - Plan: lisinopril (PRINIVIL,ZESTRIL) 5 MG tablet    Grier Mitts, MD

## 2018-01-20 NOTE — Patient Instructions (Addendum)
Shingles Shingles is an infection that causes a painful skin rash and fluid-filled blisters. Shingles is caused by the same virus that causes chickenpox. Shingles only develops in people who:  Have had chickenpox.  Have gotten the chickenpox vaccine. (This is rare.)  The first symptoms of shingles may be itching, tingling, or pain in an area on your skin. A rash will follow in a few days or weeks. The rash is usually on one side of the body in a bandlike or beltlike pattern. Over time, the rash turns into fluid-filled blisters that break open, scab over, and dry up. Medicines may:  Help you manage pain.  Help you recover more quickly.  Help to prevent long-term problems.  Follow these instructions at home: Medicines  Take medicines only as told by your doctor.  Apply an anti-itch or numbing cream to the affected area as told by your doctor. Blister and Rash Care  Take a cool bath or put cool compresses on the area of the rash or blisters as told by your doctor. This may help with pain and itching.  Keep your rash covered with a loose bandage (dressing). Wear loose-fitting clothing.  Keep your rash and blisters clean with mild soap and cool water or as told by your doctor.  Check your rash every day for signs of infection. These include redness, swelling, and pain that lasts or gets worse.  Do not pick your blisters.  Do not scratch your rash. General instructions  Rest as told by your doctor.  Keep all follow-up visits as told by your doctor. This is important.  Until your blisters scab over, your infection can cause chickenpox in people who have never had it or been vaccinated against it. To prevent this from happening, avoid touching other people or being around other people, especially: ? Babies. ? Pregnant women. ? Children who have eczema. ? Elderly people who have transplants. ? People who have chronic illnesses, such as leukemia or AIDS. Contact a doctor  if:  Your pain does not get better with medicine.  Your pain does not get better after the rash heals.  Your rash looks infected. Signs of infection include: ? Redness. ? Swelling. ? Pain that lasts or gets worse. Get help right away if:  The rash is on your face or nose.  You have pain in your face, pain around your eye area, or loss of feeling on one side of your face.  You have ear pain or you have ringing in your ear.  You have loss of taste.  Your condition gets worse. This information is not intended to replace advice given to you by your health care provider. Make sure you discuss any questions you have with your health care provider. Document Released: 05/14/2008 Document Revised: 07/22/2016 Document Reviewed: 09/07/2014 Elsevier Interactive Patient Education  2018 [employer].  Hypertension Hypertension, commonly called high blood pressure, is when the force of blood pumping through the arteries is too strong. The arteries are the blood vessels that carry blood from the heart throughout the body. Hypertension forces the heart to work harder to pump blood and may cause arteries to become narrow or stiff. Having untreated or uncontrolled hypertension can cause heart attacks, strokes, kidney disease, and other problems. A blood pressure reading consists of a higher number over a lower number. Ideally, your blood pressure should be below 120/80. The first ("top") number is called the systolic pressure. It is a measure of the pressure in your arteries as  your heart beats. The second ("bottom") number is called the diastolic pressure. It is a measure of the pressure in your arteries as the heart relaxes. What are the causes? The cause of this condition is not known. What increases the risk? Some risk factors for high blood pressure are under your control. Others are not. Factors you can change  Smoking.  Having type 2 diabetes mellitus, high cholesterol, or both.  Not  getting enough exercise or physical activity.  Being overweight.  Having too much fat, sugar, calories, or salt (sodium) in your diet.  Drinking too much alcohol. Factors that are difficult or impossible to change  Having chronic kidney disease.  Having a family history of high blood pressure.  Age. Risk increases with age.  Race. You may be at higher risk if you are African-American.  Gender. Men are at higher risk than women before age 5. After age 47, women are at higher risk than men.  Having obstructive sleep apnea.  Stress. What are the signs or symptoms? Extremely high blood pressure (hypertensive crisis) may cause:  Headache.  Anxiety.  Shortness of breath.  Nosebleed.  Nausea and vomiting.  Severe chest pain.  Jerky movements you cannot control (seizures).  How is this diagnosed? This condition is diagnosed by measuring your blood pressure while you are seated, with your arm resting on a surface. The cuff of the blood pressure monitor will be placed directly against the skin of your upper arm at the level of your heart. It should be measured at least twice using the same arm. Certain conditions can cause a difference in blood pressure between your right and left arms. Certain factors can cause blood pressure readings to be lower or higher than normal (elevated) for a short period of time:  When your blood pressure is higher when you are in a health care provider's office than when you are at home, this is called white coat hypertension. Most people with this condition do not need medicines.  When your blood pressure is higher at home than when you are in a health care provider's office, this is called masked hypertension. Most people with this condition may need medicines to control blood pressure.  If you have a high blood pressure reading during one visit or you have normal blood pressure with other risk factors:  You may be asked to return on a different  day to have your blood pressure checked again.  You may be asked to monitor your blood pressure at home for 1 week or longer.  If you are diagnosed with hypertension, you may have other blood or imaging tests to help your health care provider understand your overall risk for other conditions. How is this treated? This condition is treated by making healthy lifestyle changes, such as eating healthy foods, exercising more, and reducing your alcohol intake. Your health care provider may prescribe medicine if lifestyle changes are not enough to get your blood pressure under control, and if:  Your systolic blood pressure is above 130.  Your diastolic blood pressure is above 80.  Your personal target blood pressure may vary depending on your medical conditions, your age, and other factors. Follow these instructions at home: Eating and drinking  Eat a diet that is high in fiber and potassium, and low in sodium, added sugar, and fat. An example eating plan is called the DASH (Dietary Approaches to Stop Hypertension) diet. To eat this way: ? Eat plenty of fresh fruits and vegetables. Try  to fill half of your plate at each meal with fruits and vegetables. ? Eat whole grains, such as whole wheat pasta, brown rice, or whole grain bread. Fill about one quarter of your plate with whole grains. ? Eat or drink low-fat dairy products, such as skim milk or low-fat yogurt. ? Avoid fatty cuts of meat, processed or cured meats, and poultry with skin. Fill about one quarter of your plate with lean proteins, such as fish, chicken without skin, beans, eggs, and tofu. ? Avoid premade and processed foods. These tend to be higher in sodium, added sugar, and fat.  Reduce your daily sodium intake. Most people with hypertension should eat less than 1,500 mg of sodium a day.  Limit alcohol intake to no more than 1 drink a day for nonpregnant women and 2 drinks a day for men. One drink equals 12 oz of beer, 5 oz of wine,  or 1 oz of hard liquor. Lifestyle  Work with your health care provider to maintain a healthy body weight or to lose weight. Ask what an ideal weight is for you.  Get at least 30 minutes of exercise that causes your heart to beat faster (aerobic exercise) most days of the week. Activities may include walking, swimming, or biking.  Include exercise to strengthen your muscles (resistance exercise), such as pilates or lifting weights, as part of your weekly exercise routine. Try to do these types of exercises for 30 minutes at least 3 days a week.  Do not use any products that contain nicotine or tobacco, such as cigarettes and e-cigarettes. If you need help quitting, ask your health care provider.  Monitor your blood pressure at home as told by your health care provider.  Keep all follow-up visits as told by your health care provider. This is important. Medicines  Take over-the-counter and prescription medicines only as told by your health care provider. Follow directions carefully. Blood pressure medicines must be taken as prescribed.  Do not skip doses of blood pressure medicine. Doing this puts you at risk for problems and can make the medicine less effective.  Ask your health care provider about side effects or reactions to medicines that you should watch for. Contact a health care provider if:  You think you are having a reaction to a medicine you are taking.  You have headaches that keep coming back (recurring).  You feel dizzy.  You have swelling in your ankles.  You have trouble with your vision. Get help right away if:  You develop a severe headache or confusion.  You have unusual weakness or numbness.  You feel faint.  You have severe pain in your chest or abdomen.  You vomit repeatedly.  You have trouble breathing. Summary  Hypertension is when the force of blood pumping through your arteries is too strong. If this condition is not controlled, it may put you at  risk for serious complications.  Your personal target blood pressure may vary depending on your medical conditions, your age, and other factors. For most people, a normal blood pressure is less than 120/80.  Hypertension is treated with lifestyle changes, medicines, or a combination of both. Lifestyle changes include weight loss, eating a healthy, low-sodium diet, exercising more, and limiting alcohol. This information is not intended to replace advice given to you by your health care provider. Make sure you discuss any questions you have with your health care provider. Document Released: 11/26/2005 Document Revised: 10/24/2016 Document Reviewed: 10/24/2016 Elsevier Interactive Patient Education  2018 [employer] Inc[employer]  

## 2018-01-21 ENCOUNTER — Ambulatory Visit: Payer: Medicare Other | Admitting: Family Medicine

## 2018-01-22 ENCOUNTER — Telehealth: Payer: Self-pay | Admitting: Internal Medicine

## 2018-01-22 NOTE — Telephone Encounter (Unsigned)
Copied from [place]. Topic: Inquiry >> Jan 22, 2018  1:09 PM Neva Seat wrote: [facility] P[facility] - [facility] - Faxed 20 mins ago - an order asking if pt is contagious or not due to shingles diagnosis.

## 2018-01-23 NOTE — Telephone Encounter (Signed)
Copied from [place]. Topic: Inquiry >> Jan 22, 2018  1:09 PM Neva Seat wrote: [facility]den Purl - [facility] - Faxed 20 mins ago - an order asking if pt is contagious or not due to shingles diagnosis. >> Jan 23, 2018 10:55 AM Cleaster Corin, NT wrote: Royden Purl from [facility] calling to see if pt. Can come out of room due to having shingles. No one has returned call.  [phone]

## 2018-01-23 NOTE — Telephone Encounter (Signed)
Spoke with the CNA that was over Stephanie Zhang for the night and she stated that they got in touch with one of the onsite nurses and they educated them on shingles.

## 2018-01-31 NOTE — Progress Notes (Signed)
Initiated on cover my meds 

## 2018-02-03 ENCOUNTER — Telehealth: Payer: Self-pay | Admitting: Neurology

## 2018-02-03 NOTE — Telephone Encounter (Signed)
[name] at [facility] called and needs to speak to someone about a referral that was sent for and about medication  CB#[phone]

## 2018-02-03 NOTE — Telephone Encounter (Signed)
I spoke with Stephanie Zhang and she said that she did not think that patient had home health ordered.  Informed her that I did not see any mention of it in Dr. Georgie Chard notes.  Will check on medication PA.

## 2018-02-05 ENCOUNTER — Other Ambulatory Visit: Payer: Self-pay | Admitting: Neurology

## 2018-02-11 ENCOUNTER — Telehealth: Payer: Self-pay | Admitting: Neurology

## 2018-02-11 NOTE — Telephone Encounter (Signed)
Pt's sister called in regards to pt's Rebif and needing a prior authorization

## 2018-02-12 NOTE — Telephone Encounter (Signed)
Wasn't sure the status of this prior authorization and you note indicated you were going to check on it.  Can you look in to that and let me know?  I can call her back with the answer

## 2018-02-13 NOTE — Telephone Encounter (Signed)
Pt sister is calling to check the status of the this phone call please cal to let her know something

## 2018-02-17 ENCOUNTER — Encounter: Payer: Self-pay | Admitting: Internal Medicine

## 2018-02-17 ENCOUNTER — Ambulatory Visit (INDEPENDENT_AMBULATORY_CARE_PROVIDER_SITE_OTHER): Payer: Medicare Other | Admitting: Internal Medicine

## 2018-02-17 ENCOUNTER — Other Ambulatory Visit: Payer: Self-pay

## 2018-02-17 VITALS — BP 138/80 | HR 89 | Temp 97.9°F | Wt 123.0 lb

## 2018-02-17 DIAGNOSIS — G35 Multiple sclerosis: Secondary | ICD-10-CM | POA: Diagnosis not present

## 2018-02-17 DIAGNOSIS — R269 Unspecified abnormalities of gait and mobility: Secondary | ICD-10-CM | POA: Diagnosis not present

## 2018-02-17 NOTE — Patient Instructions (Signed)
Neurology follow-up as scheduled  Discontinue oxybutynin  Return in 6 months or as needed

## 2018-02-17 NOTE — Progress Notes (Signed)
Subjective:    Patient ID: Stephanie Zhang, female    DOB: 1961/05/19, 57 y.o.   MRN: [id number]  HPI  57 year old patient who has a history of MS and is followed by neurology.  She has chronic gait ataxia and 2 days ago fell while in the shower.  She has had some upper back discomfort. There has been a history of confusion but apparently this has improved since discontinuation of amantadine. The patient has been on oxybutynin which was discontinued today She has essential hypertension which has been stable.  She was able to go to church yesterday  Past Medical History:  Diagnosis Date  . Anxiety   . Blood transfusion    "when I was born"  . Breast cancer ([place]) 10/2001   Stage II (right side)  . Chronic back pain   . Confusion   . Depression   . Gastroparesis   . GERD (gastroesophageal reflux disease)   . Headache(784.0)    "lots"  . Hypoglycemia   . MS (multiple sclerosis) ([place]) 1987  . Multiple sclerosis   . Osteoporosis   . Short-term memory loss      Social History   Socioeconomic History  . Marital status: Widowed    Spouse name: Not on file  . Number of children: Not on file  . Years of education: Not on file  . Highest education level: Not on file  Social Needs  . Financial resource strain: Not on file  . Food insecurity - worry: Not on file  . Food insecurity - inability: Not on file  . Transportation needs - medical: Not on file  . Transportation needs - non-medical: Not on file  Occupational History  . Not on file  Tobacco Use  . Smoking status: Former Smoker    Last attempt to quit: 05/11/2011    Years since quitting: 6.7  . Smokeless tobacco: Never Used  Substance and Sexual Activity  . Alcohol use: No    Comment: "last alcohol 12/04/11"  . Drug use: No    Comment: "last drug use ~ 1980"  . Sexual activity: No  Other Topics Concern  . Not on file  Social History Narrative  . Not on file    Past Surgical History:  Procedure Laterality Date   . BREAST BIOPSY  2002   right  . BREAST LUMPECTOMY  2002   right  . COLONOSCOPY N/A 04/21/2013   Procedure: COLONOSCOPY;  Surgeon: Ladene Artist, MD;  Location: [facility];  Service: Endoscopy;  Laterality: N/A;    Family History  Problem Relation Age of Onset  . Diabetes Mother   . Cancer Mother        cervical cancer  . Heart disease Mother   . Diabetes Father     Allergies  Allergen Reactions  . Amantadines   . Sulfa Antibiotics Itching and Rash  . Sulfacetamide Sodium Itching and Rash    Current Outpatient Medications on File Prior to Visit  Medication Sig Dispense Refill  . acetaminophen (TYLENOL) 500 MG tablet Take 1,000 mg by mouth every 6 (six) hours as needed for mild pain.    [name] buPROPion (WELLBUTRIN) 75 MG tablet Take 2 tablets (150 mg total) by mouth daily. 60 tablet 5  . Calcium Carbonate-Vitamin D 600-400 MG-UNIT tablet Take 1 tablet by mouth daily. 30 tablet 5  . Cholecalciferol (VITAMIN D) 2000 units tablet GIVE 1 TAB BY MOUTH EVERY DAY 30 tablet 10  . docusate sodium (COLACE) 100  MG capsule Take 1 capsule (100 mg total) by mouth 2 (two) times daily. 180 capsule 1  . famotidine (PEPCID) 20 MG tablet Take 1 tablet (20 mg total) by mouth at bedtime. 30 tablet 11  . guaiFENesin (ROBITUSSIN) 100 MG/5ML liquid Take 200 mg by mouth every 6 (six) hours as needed for cough.    . hydrocortisone cream 1 % Apply twice daily to affected areas 45 g 0  . ibuprofen (ADVIL,MOTRIN) 800 MG tablet Take by mouth.    [name] lisinopril (PRINIVIL,ZESTRIL) 5 MG tablet Take 1 tablet (5 mg total) by mouth daily. 30 tablet 3  . loperamide (IMODIUM) 2 MG capsule Take 2 mg by mouth every 4 (four) hours as needed for diarrhea or loose stools.     . polyethylene glycol (MIRALAX / GLYCOLAX) packet Take 17 g by mouth daily. 100 each 11  . promethazine (PHENERGAN) 25 MG tablet Take 25 mg by mouth every 6 (six) hours as needed for nausea or vomiting.    [name] REBIF 44 MCG/0.5ML SOSY injection INJECT  0.5 MLS (44 MCG TOTAL) INTO THE SKIN 3 (THREE) TIMES A WEEK. 12 Syringe 11  . REBIF 44 MCG/0.5ML SOSY injection INJECT ONE SYRINGE SUBCUTANEOUSLY THREE TIMES PER WEEK. KEEP REFRIGERATED. 12 Syringe 6  . senna (SENOKOT) 8.6 MG tablet Take by mouth.    . Skin Protectants, Misc. (EUCERIN) cream Apply 1 application topically 2 (two) times daily as needed for dry skin.     No current facility-administered medications on file prior to visit.     BP 138/80 (BP Location: Right Arm, Patient Position: Sitting, Cuff Size: Normal)   Pulse 89   Temp 97.9 F (36.6 C) (Oral)   Wt 123 lb (55.8 kg)   LMP 08/09/2008   SpO2 99%   BMI 21.11 kg/m     Review of Systems  Constitutional: Negative.   HENT: Negative for congestion, dental problem, hearing loss, rhinorrhea, sinus pressure, sore throat and tinnitus.   Eyes: Negative for pain, discharge and visual disturbance.  Respiratory: Negative for cough and shortness of breath.   Cardiovascular: Negative for chest pain, palpitations and leg swelling.  Gastrointestinal: Negative for abdominal distention, abdominal pain, blood in stool, constipation, diarrhea, nausea and vomiting.  Genitourinary: Negative for difficulty urinating, dysuria, flank pain, frequency, hematuria, pelvic pain, urgency, vaginal bleeding, vaginal discharge and vaginal pain.  Musculoskeletal: Positive for back pain, gait problem and neck pain. Negative for arthralgias and joint swelling.  Skin: Negative for rash.  Neurological: Negative for dizziness, syncope, speech difficulty, weakness, numbness and headaches.  Hematological: Negative for adenopathy.  Psychiatric/Behavioral: Positive for confusion. Negative for agitation, behavioral problems and dysphoric mood. The patient is not nervous/anxious.        Objective:   Physical Exam  Constitutional: She is oriented to person, place, and time. She appears well-developed and well-nourished. No distress.  Wheelchair-bound Blood  pressure 130/80   HENT:  Head: Normocephalic.  Right Ear: External ear normal.  Left Ear: External ear normal.  Mouth/Throat: Oropharynx is clear and moist.  Eyes: Conjunctivae and EOM are normal. Pupils are equal, round, and reactive to light.  Neck: Normal range of motion. Neck supple. No thyromegaly present.  Cardiovascular: Normal rate, regular rhythm, normal heart sounds and intact distal pulses.  Pulmonary/Chest: Effort normal and breath sounds normal.  Abdominal: Soft. Bowel sounds are normal. She exhibits no mass. There is no tenderness.  Musculoskeletal: Normal range of motion.  No scalp hematoma  Lymphadenopathy:    She has  no cervical adenopathy.  Neurological: She is alert and oriented to person, place, and time. Coordination abnormal.  Skin: Skin is warm and dry. No rash noted.  Psychiatric: She has a normal mood and affect. Her behavior is normal.          Assessment & Plan:   MS with gait ataxia Recent fall.  Clinically stable History of confusion.  This predated recent fall and has improved since discontinuation of amantadine.  Will give trial off oxybutynin  Follow-up neurology Hopefully can resume Rebif.  Patient has been off 2 weeks due to cost consideration.  Follow-up neurology  Nyoka Cowden

## 2018-02-18 ENCOUNTER — Telehealth: Payer: Self-pay | Admitting: Neurology

## 2018-02-18 NOTE — Telephone Encounter (Signed)
I have been checking with cover my meds to give her an answer.  Just waiting to see if it is covered.

## 2018-02-18 NOTE — Telephone Encounter (Signed)
Patient sister wants to know why no one has called her back and patient is out of medication rebif shot. We need to get auth from the 2nd ins [employer]  Please call today

## 2018-02-18 NOTE — Telephone Encounter (Signed)
See next note

## 2018-02-20 NOTE — Telephone Encounter (Signed)
Please advise previous notes.

## 2018-02-20 NOTE — Telephone Encounter (Signed)
Received a [employer] form which is completed and needs to be sign by Dr. Tomi Likens in order for patient to go into a program that will pay for her Rebif medication. Form will be in Dr. Tomi Likens office for him to sign.

## 2018-02-21 NOTE — Telephone Encounter (Signed)
I will sign any forms necessary

## 2018-02-21 NOTE — Telephone Encounter (Signed)
Forms signed and faxed.

## 2018-02-24 NOTE — Telephone Encounter (Signed)
Spoke with patient sister and notified I had faxed over completed forms on Friday 02/21/17 waiting for response from [place] to respond back for financial assistance on patient's Rebif

## 2018-02-24 NOTE — Telephone Encounter (Signed)
*  STAT* If patient is at the pharmacy, call can be transferred to refill team.  1.     Which medications need to be refilled? (please list name of each medication and dose if know) Rebif  2.     Which pharmacy/location (including street and city if local pharmacy) is medication to be sent to?  3.     Do they need a 30 or 90 day supply?    Pt's sister called in regards to the Rebif and that she has not received a call back regarding this

## 2018-02-28 NOTE — Telephone Encounter (Signed)
Spoke with representative for [employer] and they stated they received forms and will cover expenses for patients Rebif medication

## 2018-03-04 ENCOUNTER — Telehealth: Payer: Self-pay | Admitting: Neurology

## 2018-03-04 NOTE — Progress Notes (Signed)
Yes

## 2018-03-04 NOTE — Progress Notes (Signed)
Stephanie Zhang with Rebif called office, dx was changed from relapsing remitting MS, to secondary progressive. With the new dx, she is no longer eligible for free Rebif. Pt has been off medication x3 weeks.

## 2018-03-05 NOTE — Progress Notes (Signed)
Spoke with Santiago Glad with Rebif. Advsd her Dx is correct and Dr Tomi Likens indicated Rebif isn't actually indicated for 2ndry progressive.Santiago Glad will talk to Pt's sister Williemae Area and let her know, and will have sister call me if she has any questions. Karens contact [phone]

## 2018-03-14 ENCOUNTER — Ambulatory Visit (INDEPENDENT_AMBULATORY_CARE_PROVIDER_SITE_OTHER): Payer: Medicare Other | Admitting: Neurology

## 2018-03-14 ENCOUNTER — Encounter: Payer: Self-pay | Admitting: Neurology

## 2018-03-14 VITALS — BP 110/76 | HR 108 | Resp 16 | Ht 64.0 in | Wt 122.0 lb

## 2018-03-14 DIAGNOSIS — G35 Multiple sclerosis: Secondary | ICD-10-CM | POA: Diagnosis not present

## 2018-03-14 DIAGNOSIS — D329 Benign neoplasm of meninges, unspecified: Secondary | ICD-10-CM | POA: Diagnosis not present

## 2018-03-14 DIAGNOSIS — F329 Major depressive disorder, single episode, unspecified: Secondary | ICD-10-CM | POA: Diagnosis not present

## 2018-03-14 DIAGNOSIS — R4189 Other symptoms and signs involving cognitive functions and awareness: Secondary | ICD-10-CM

## 2018-03-14 DIAGNOSIS — G3184 Mild cognitive impairment, so stated: Secondary | ICD-10-CM | POA: Diagnosis not present

## 2018-03-14 DIAGNOSIS — F32A Depression, unspecified: Secondary | ICD-10-CM

## 2018-03-14 NOTE — Progress Notes (Signed)
NEUROLOGY FOLLOW UP OFFICE NOTE  Stephanie Zhang [id number]  HISTORY OF PRESENT ILLNESS: Stephanie Zhang is a 57 year old female with chronic back pain, osteoporosis, GERD, thrombocytopenia, and history of breast cancer who follows up for secondary progressive multiple sclerosis.  She is accompanied by her sister who supplements history.   UPDATE: She was denied coverage of Rebif due to her diagnosis of secondary progressive MS.  She is taking bupropion 75mg  daily for depression She is taking oxybutynin for neurogenic bladder She is taking lorazepam 0.5mg  as needed for anxiety. She is taking D3 2000 IU daily?  MRI of brain with and without contrast from 10/22/17 was stable compared to prior study from 09/08/16 with no evidence of progression or contrast enhancing lesions.  Left frontal convexity meningioma is stable at 8 by 7 mm.  Vision:  No change Motor:  No change.   Sensory:  No change Pain:  No change Gait:  Able to ambulate with walker.   Bowel/bladder:  Stress incontinence.  One episode of bowel incontinence Fatigue:  yes Cognition:  Cognitive impairment.  Short term memory deficits.  Does not remember certain family members are dead.  Continues to hallucinate about once a week Mood:  Stable.   HISTORY: She was diagnosed with multiple sclerosis at around age 46.  She reports left sided numbness, ataxia, and visual disturbance.  She had bilateral INO.  She did undergo a lumbar puncture at the time.[name]   She was initially on Avonex, which was switched to Rebif in 2004 due to change in insurance. She has required treatment of flare-ups with Solu-Medrol, usually presenting as worsening gait with falls.  As per prior neurologist's notes, her last steroid treatment was in 2010.  She has had a progressive physical decline since then.   Her ex-husband passed away on 01/13/17.  Since then, she reports visual and auditory hallucinations.  In the middle of the night, she may see and  hear her ex-husband outside the window.  On at least one occasion, she woke up in the middle of the night and heard knocking on her window.  She saw somebody outside speaking [place].  She told the nurses and they didn't see anyone.  One afternoon, she was walking in the courtyard and saw a [place].  Her friend didn't see anything.  Later, when she was in the dining room, she saw the [place] outside the window.  Sometimes, these hallucinations are frighthening.  They occur 2 to 3 times a week.     She currently lives in assisted living at [facility].  At baseline, she exhibits extra-pyramidal symptoms such as tremor.  She has memory deficits.  She requires use of a walker since 2005.  She has neurogenic bladder.  She needs to push down on her stomach in order to help void.  She sometimes has constipation or diarrhea.   MRI of brain with and without contrast from 09/08/16 reports "no change in the appearance of numerous subcortical, periventricular, and deep white matter T2 hyperintense lesions" when compared to prior imaging from 10/23/08.  However, it also reports mild interval enlargement of a meningioma "in the left frontal dura which measures 5 x 11 mm, previously 2 x 10 mm."  She was referred to neurosurgery for evaluation of meningioma.  Continued monitoring was recommended.  PAST MEDICAL HISTORY: Past Medical History:  Diagnosis Date  . Anxiety   . Blood transfusion    "when I was born"  . Breast cancer ([place])  10/2001   Stage II (right side)  . Chronic back pain   . Confusion   . Depression   . Gastroparesis   . GERD (gastroesophageal reflux disease)   . Headache(784.0)    "lots"  . Hypoglycemia   . MS (multiple sclerosis) ([place]) 1987  . Multiple sclerosis   . Osteoporosis   . Short-term memory loss     MEDICATIONS: Current Outpatient Medications on File Prior to Visit  Medication Sig Dispense Refill  . acetaminophen (TYLENOL) 500 MG tablet Take 1,000 mg by mouth every 6 (six)  hours as needed for mild pain.    [name] buPROPion (WELLBUTRIN) 75 MG tablet Take 2 tablets (150 mg total) by mouth daily. 60 tablet 5  . Calcium Carbonate-Vitamin D 600-400 MG-UNIT tablet Take 1 tablet by mouth daily. 30 tablet 5  . Cholecalciferol (VITAMIN D) 2000 units tablet GIVE 1 TAB BY MOUTH EVERY DAY 30 tablet 10  . docusate sodium (COLACE) 100 MG capsule Take 1 capsule (100 mg total) by mouth 2 (two) times daily. 180 capsule 1  . famotidine (PEPCID) 20 MG tablet Take 1 tablet (20 mg total) by mouth at bedtime. 30 tablet 11  . guaiFENesin (ROBITUSSIN) 100 MG/5ML liquid Take 200 mg by mouth every 6 (six) hours as needed for cough.    . hydrocortisone cream 1 % Apply twice daily to affected areas 45 g 0  . ibuprofen (ADVIL,MOTRIN) 800 MG tablet Take by mouth.    [name] lisinopril (PRINIVIL,ZESTRIL) 5 MG tablet Take 1 tablet (5 mg total) by mouth daily. 30 tablet 3  . loperamide (IMODIUM) 2 MG capsule Take 2 mg by mouth every 4 (four) hours as needed for diarrhea or loose stools.     . polyethylene glycol (MIRALAX / GLYCOLAX) packet Take 17 g by mouth daily. 100 each 11  . promethazine (PHENERGAN) 25 MG tablet Take 25 mg by mouth every 6 (six) hours as needed for nausea or vomiting.    [name] REBIF 44 MCG/0.5ML SOSY injection INJECT 0.5 MLS (44 MCG TOTAL) INTO THE SKIN 3 (THREE) TIMES A WEEK. 12 Syringe 11  . REBIF 44 MCG/0.5ML SOSY injection INJECT ONE SYRINGE SUBCUTANEOUSLY THREE TIMES PER WEEK. KEEP REFRIGERATED. 12 Syringe 6  . senna (SENOKOT) 8.6 MG tablet Take by mouth.    . Skin Protectants, Misc. (EUCERIN) cream Apply 1 application topically 2 (two) times daily as needed for dry skin.     No current facility-administered medications on file prior to visit.     ALLERGIES: Allergies  Allergen Reactions  . Amantadines   . Sulfa Antibiotics Itching and Rash  . Sulfacetamide Sodium Itching and Rash    FAMILY HISTORY: Family History  Problem Relation Age of Onset  . Diabetes Mother   .  Cancer Mother        cervical cancer  . Heart disease Mother   . Diabetes Father     SOCIAL HISTORY: Social History   Socioeconomic History  . Marital status: Widowed    Spouse name: Not on file  . Number of children: Not on file  . Years of education: Not on file  . Highest education level: Not on file  Occupational History  . Not on file  Social Needs  . Financial resource strain: Not on file  . Food insecurity:    Worry: Not on file    Inability: Not on file  . Transportation needs:    Medical: Not on file    Non-medical: Not on file  Tobacco Use  . Smoking status: Former Smoker    Last attempt to quit: 05/11/2011    Years since quitting: 6.8  . Smokeless tobacco: Never Used  Substance and Sexual Activity  . Alcohol use: No    Comment: "last alcohol 12/04/11"  . Drug use: No    Types: LSD, Marijuana, "Crack" cocaine    Comment: "last drug use ~ 1980"  . Sexual activity: Never  Lifestyle  . Physical activity:    Days per week: Not on file    Minutes per session: Not on file  . Stress: Not on file  Relationships  . Social connections:    Talks on phone: Not on file    Gets together: Not on file    Attends religious service: Not on file    Active member of club or organization: Not on file    Attends meetings of clubs or organizations: Not on file    Relationship status: Not on file  . Intimate partner violence:    Fear of current or ex partner: Not on file    Emotionally abused: Not on file    Physically abused: Not on file    Forced sexual activity: Not on file  Other Topics Concern  . Not on file  Social History Narrative  . Not on file    REVIEW OF SYSTEMS: Constitutional: No fevers, chills, or sweats, no generalized fatigue, change in appetite Eyes: No visual changes, double vision, eye pain Ear, nose and throat: No hearing loss, ear pain, nasal congestion, sore throat Cardiovascular: No chest pain, palpitations Respiratory:  No shortness of breath  at rest or with exertion, wheezes GastrointestinaI: No nausea, vomiting, diarrhea, abdominal pain, fecal incontinence Genitourinary:  No dysuria, urinary retention or frequency Musculoskeletal:  No neck pain, back pain Integumentary: No rash, pruritus, skin lesions Neurological: as above Psychiatric: No depression, insomnia, anxiety Endocrine: No palpitations, fatigue, diaphoresis, mood swings, change in appetite, change in weight, increased thirst Hematologic/Lymphatic:  No purpura, petechiae. Allergic/Immunologic: no itchy/runny eyes, nasal congestion, recent allergic reactions, rashes  PHYSICAL EXAM: Vitals:   03/14/18 0859  BP: 110/76  Pulse: (!) 108  Resp: 16  SpO2: 98%   General: No acute distress.  Head:  Normocephalic/atraumatic Eyes:  Fundi examined but not visualized Neck: supple, no paraspinal tenderness, full range of motion Heart:  Regular rate and rhythm Lungs:  Clear to auscultation bilaterally Back: No paraspinal tenderness Neurological Exam: alert and oriented to person, place, and time. Attention span and concentration intact, recent and remote memory intact, fund of knowledge intact.  Speech fluent and not dysarthric, language intact.  Bilateral internuclear ophthalmoplegia.  Otherwise, CN II-XII intact. Bulk and tone normal, muscle strength 5/5 throughout.  Sensation to light touch intact.  Deep tendon reflexes 2+ throughout, toes upgoing.  Finger to nose testing with bilateral tremor and dysmetria and heel to shin testing with dysmetria  In wheelchair.  Unable to ambulate without assistance.  IMPRESSION: 1.  Secondary progressive multiple sclerosis.  At this time, she does not exhibit any active disease based on recent history and MRI.  Therefore, she would not yet qualify for siponimod or cladribine. 2.  Cerebral meningioma, stable  PLAN: 1.  Now that she is off of Rebif, we will repeat MRI of brain with and without contrast in November to evaluate for any  disease progression that may show indication for one of the new medications for SPMS. 2.  Episodes of hallucinations with agitation are infrequent, about once a week.  Therefore I would only treat as needed and not start daily dose of Seroquel. 3.  Follow up in 7 months.  27 minutes spent face to face with patient, over 50% spent discussing management.  Metta Clines, DO  CC:  Dr. Burnice Logan

## 2018-03-14 NOTE — Patient Instructions (Signed)
We will repeat MRI of brain with and without contrast in 7 months.  Follow up soon afterwards.

## 2018-04-04 ENCOUNTER — Encounter: Payer: Self-pay | Admitting: Neurology

## 2018-04-07 ENCOUNTER — Other Ambulatory Visit: Payer: Self-pay

## 2018-04-07 MED ORDER — EUCERIN EX CREA: 1.0000 "application " | TOPICAL_CREAM | Freq: Two times a day (BID) | CUTANEOUS | 0 refills | Status: AC | PRN

## 2018-04-11 ENCOUNTER — Encounter: Payer: Self-pay | Admitting: Adult Health

## 2018-04-11 ENCOUNTER — Ambulatory Visit (INDEPENDENT_AMBULATORY_CARE_PROVIDER_SITE_OTHER): Payer: Medicare Other | Admitting: Adult Health

## 2018-04-11 VITALS — BP 108/82 | HR 102 | Temp 99.5°F | Wt 125.0 lb

## 2018-04-11 DIAGNOSIS — R350 Frequency of micturition: Secondary | ICD-10-CM | POA: Diagnosis not present

## 2018-04-11 DIAGNOSIS — N3000 Acute cystitis without hematuria: Secondary | ICD-10-CM | POA: Diagnosis not present

## 2018-04-11 DIAGNOSIS — J302 Other seasonal allergic rhinitis: Secondary | ICD-10-CM

## 2018-04-11 LAB — POCT URINALYSIS DIPSTICK
Bilirubin, UA: NEGATIVE
Blood, UA: NEGATIVE
Glucose, UA: NEGATIVE
KETONES UA: NEGATIVE
Leukocytes, UA: NEGATIVE
Nitrite, UA: POSITIVE
Protein, UA: NEGATIVE
Spec Grav, UA: 1.015 (ref 1.010–1.025)
UROBILINOGEN UA: 0.2 U/dL
pH, UA: 7 (ref 5.0–8.0)

## 2018-04-11 MED ORDER — LORATADINE 10 MG PO TABS: 10.0000 mg | ORAL_TABLET | Freq: Every day | ORAL | 2 refills | Status: DC

## 2018-04-11 MED ORDER — CIPROFLOXACIN HCL 500 MG PO TABS: 500.0000 mg | ORAL_TABLET | Freq: Two times a day (BID) | ORAL | 0 refills | Status: DC

## 2018-04-11 MED ORDER — FLUTICASONE PROPIONATE 50 MCG/ACT NA SUSP: 2.0000 | Freq: Every day | NASAL | 2 refills | Status: DC

## 2018-04-11 NOTE — Progress Notes (Signed)
Subjective:    Patient ID: Stephanie Zhang, female    DOB: Oct 22, 1961, 57 y.o.   MRN: [id number]  URI   This is a new problem. The current episode started in the past 7 days (2-3 days ). The problem has been unchanged. Maximum temperature: 99 F. The fever has been present for 1 to 2 days. Associated symptoms include abdominal pain, congestion, coughing, headaches, nausea, rhinorrhea, a sore throat and swollen glands. Pertinent negatives include no ear pain, sinus pain or vomiting. Treatments tried: Robatussin  The treatment provided mild relief.   He also reports that over the last 2-3 days she has had urinary frequency and episodes of lower abdominal pain. She denies any burning with urination or odorous urine. Denies any low back pain   Review of Systems  Constitutional: Positive for fatigue.  HENT: Positive for congestion, rhinorrhea, sinus pressure and sore throat. Negative for ear pain, sinus pain, trouble swallowing and voice change.   Eyes: Positive for discharge and itching. Negative for photophobia, pain, redness and visual disturbance.  Respiratory: Positive for cough.   Cardiovascular: Negative.   Gastrointestinal: Positive for abdominal pain and nausea. Negative for vomiting.  Neurological: Positive for headaches.  All other systems reviewed and are negative.  See HPI   Past Medical History:  Diagnosis Date  . Anxiety   . Blood transfusion    "when I was born"  . Breast cancer ([place]) 10/2001   Stage II (right side)  . Chronic back pain   . Confusion   . Depression   . Gastroparesis   . GERD (gastroesophageal reflux disease)   . Headache(784.0)    "lots"  . Hypoglycemia   . MS (multiple sclerosis) ([place]) 1987  . Multiple sclerosis   . Osteoporosis   . Short-term memory loss     Social History   Socioeconomic History  . Marital status: Widowed    Spouse name: Not on file  . Number of children: Not on file  . Years of education: Not on file  . Highest  education level: Not on file  Occupational History  . Not on file  Social Needs  . Financial resource strain: Not on file  . Food insecurity:    Worry: Not on file    Inability: Not on file  . Transportation needs:    Medical: Not on file    Non-medical: Not on file  Tobacco Use  . Smoking status: Former Smoker    Last attempt to quit: 05/11/2011    Years since quitting: 6.9  . Smokeless tobacco: Never Used  Substance and Sexual Activity  . Alcohol use: No    Comment: "last alcohol 12/04/11"  . Drug use: No    Types: LSD, Marijuana, "Crack" cocaine    Comment: "last drug use ~ 1980"  . Sexual activity: Never  Lifestyle  . Physical activity:    Days per week: Not on file    Minutes per session: Not on file  . Stress: Not on file  Relationships  . Social connections:    Talks on phone: Not on file    Gets together: Not on file    Attends religious service: Not on file    Active member of club or organization: Not on file    Attends meetings of clubs or organizations: Not on file    Relationship status: Not on file  . Intimate partner violence:    Fear of current or ex partner: Not on file  Emotionally abused: Not on file    Physically abused: Not on file    Forced sexual activity: Not on file  Other Topics Concern  . Not on file  Social History Narrative  . Not on file    Past Surgical History:  Procedure Laterality Date  . BREAST BIOPSY  2002   right  . BREAST LUMPECTOMY  2002   right  . COLONOSCOPY N/A 04/21/2013   Procedure: COLONOSCOPY;  Surgeon: Ladene Artist, MD;  Location: [facility];  Service: Endoscopy;  Laterality: N/A;    Family History  Problem Relation Age of Onset  . Diabetes Mother   . Cancer Mother        cervical cancer  . Heart disease Mother   . Diabetes Father     Allergies  Allergen Reactions  . Amantadines   . Sulfa Antibiotics Itching and Rash  . Sulfacetamide Sodium Itching and Rash    Current Outpatient Medications on  File Prior to Visit  Medication Sig Dispense Refill  . acetaminophen (TYLENOL) 500 MG tablet Take 1,000 mg by mouth every 6 (six) hours as needed for mild pain.    [name] buPROPion (WELLBUTRIN) 75 MG tablet Take 2 tablets (150 mg total) by mouth daily. 60 tablet 5  . Calcium Carbonate-Vitamin D 600-400 MG-UNIT tablet Take 1 tablet by mouth daily. 30 tablet 5  . Cholecalciferol (VITAMIN D) 2000 units tablet GIVE 1 TAB BY MOUTH EVERY DAY 30 tablet 10  . docusate sodium (COLACE) 100 MG capsule Take 1 capsule (100 mg total) by mouth 2 (two) times daily. 180 capsule 1  . famotidine (PEPCID) 20 MG tablet Take 1 tablet (20 mg total) by mouth at bedtime. 30 tablet 11  . guaiFENesin (ROBITUSSIN) 100 MG/5ML liquid Take 200 mg by mouth every 6 (six) hours as needed for cough.    . hydrocortisone cream 1 % Apply twice daily to affected areas 45 g 0  . ibuprofen (ADVIL,MOTRIN) 800 MG tablet Take by mouth.    [name] lisinopril (PRINIVIL,ZESTRIL) 5 MG tablet Take 1 tablet (5 mg total) by mouth daily. 30 tablet 3  . loperamide (IMODIUM) 2 MG capsule Take 2 mg by mouth every 4 (four) hours as needed for diarrhea or loose stools.     . polyethylene glycol (MIRALAX / GLYCOLAX) packet Take 17 g by mouth daily. 100 each 11  . promethazine (PHENERGAN) 25 MG tablet Take 25 mg by mouth every 6 (six) hours as needed for nausea or vomiting.    [name] REBIF 44 MCG/0.5ML SOSY injection INJECT 0.5 MLS (44 MCG TOTAL) INTO THE SKIN 3 (THREE) TIMES A WEEK. 12 Syringe 11  . REBIF 44 MCG/0.5ML SOSY injection INJECT ONE SYRINGE SUBCUTANEOUSLY THREE TIMES PER WEEK. KEEP REFRIGERATED. 12 Syringe 6  . senna (SENOKOT) 8.6 MG tablet Take by mouth.    . Skin Protectants, Misc. (EUCERIN) cream Apply 1 application topically 2 (two) times daily as needed for dry skin. 454 g 0   No current facility-administered medications on file prior to visit.     BP 108/82 (BP Location: Left Arm, Patient Position: Sitting, Cuff Size: Normal)   Pulse (!) 102    Temp 99.5 F (37.5 C) (Oral)   Wt 125 lb (56.7 kg)   LMP 08/09/2008   SpO2 98%   BMI 21.46 kg/m       Objective:   Physical Exam  Constitutional: She appears well-developed and well-nourished. No distress.  HENT:  Right Ear: Hearing normal. Tympanic membrane is  bulging. Tympanic membrane is not erythematous.  Left Ear: Tympanic membrane is bulging. Tympanic membrane is not erythematous.  Nose: Rhinorrhea (clear ) present. Right sinus exhibits no maxillary sinus tenderness and no frontal sinus tenderness. Left sinus exhibits no maxillary sinus tenderness and no frontal sinus tenderness.  Mouth/Throat: Uvula is midline and mucous membranes are normal. Oropharyngeal exudate (clear PND ) present. No posterior oropharyngeal edema, posterior oropharyngeal erythema or tonsillar abscesses.  Eyes: Pupils are equal, round, and reactive to light. EOM are normal. Right eye exhibits discharge (clear ). Left eye exhibits discharge (clear). Right conjunctiva is not injected. Left conjunctiva is not injected. Left conjunctiva has no hemorrhage.  Cardiovascular: Normal rate, regular rhythm, normal heart sounds and intact distal pulses. Exam reveals no gallop and no friction rub.  No murmur heard. Pulmonary/Chest: Effort normal and breath sounds normal. No stridor. No respiratory distress. She has no wheezes. She has no rales.  Musculoskeletal:  In wheelchair    Skin: She is not diaphoretic.  Nursing note and vitals reviewed.     Assessment & Plan:  1. Seasonal allergies - No concern for sinusitis. Will treat seasonal allergies - fluticasone (FLONASE) 50 MCG/ACT nasal spray; Place 2 sprays into both nostrils daily.  Dispense: 16 g; Refill: 2 - loratadine (CLARITIN) 10 MG tablet; Take 1 tablet (10 mg total) by mouth daily.  Dispense: 30 tablet; Refill: 2 - Follow up if no improvement in the next 2-3 days or sooner if needed 2. Urinary frequency  - POC Urinalysis Dipstick + Nit.   3. Acute  cystitis without hematuria - Cipro 500 mg BID x 3 days  - Urine Culture   Dorothyann Peng, NP

## 2018-04-11 NOTE — Patient Instructions (Signed)
It was great seeing you today   I have prescribed Flonase and Claritin for seasonal allergies. Take as directed   I have also prescribed Cipro for your UTI.   Make sure you stay hydrated and rest   Follow up as needed

## 2018-04-14 LAB — URINE CULTURE
MICRO NUMBER: 90542876
SPECIMEN QUALITY: ADEQUATE

## 2018-04-16 ENCOUNTER — Ambulatory Visit: Payer: Medicare Other | Admitting: Neurology

## 2018-05-01 ENCOUNTER — Encounter: Payer: Self-pay | Admitting: Internal Medicine

## 2018-05-01 DIAGNOSIS — Z79899 Other long term (current) drug therapy: Secondary | ICD-10-CM | POA: Diagnosis not present

## 2018-05-01 DIAGNOSIS — R319 Hematuria, unspecified: Secondary | ICD-10-CM | POA: Diagnosis not present

## 2018-05-01 DIAGNOSIS — I10 Essential (primary) hypertension: Secondary | ICD-10-CM | POA: Diagnosis not present

## 2018-05-07 ENCOUNTER — Other Ambulatory Visit: Payer: Self-pay

## 2018-05-07 MED ORDER — CIPROFLOXACIN HCL 500 MG PO TABS: 500.0000 mg | ORAL_TABLET | Freq: Two times a day (BID) | ORAL | 0 refills | Status: DC

## 2018-05-09 ENCOUNTER — Telehealth: Payer: Self-pay | Admitting: Family Medicine

## 2018-05-09 NOTE — Telephone Encounter (Signed)
Copied from [place] [id number]. Topic: General - Other >> May 09, 2018  1:04 PM Yvette Rack wrote: Reason for CRM: Darlina Guys with [facility] states they can begin service on Tuesday 05/13/18. Cb# [phone]

## 2018-05-09 NOTE — Telephone Encounter (Signed)
Noted  

## 2018-05-13 ENCOUNTER — Telehealth: Payer: Self-pay | Admitting: Neurology

## 2018-05-13 DIAGNOSIS — M810 Age-related osteoporosis without current pathological fracture: Secondary | ICD-10-CM | POA: Diagnosis not present

## 2018-05-13 DIAGNOSIS — F329 Major depressive disorder, single episode, unspecified: Secondary | ICD-10-CM | POA: Diagnosis not present

## 2018-05-13 DIAGNOSIS — M549 Dorsalgia, unspecified: Secondary | ICD-10-CM | POA: Diagnosis not present

## 2018-05-13 DIAGNOSIS — G8929 Other chronic pain: Secondary | ICD-10-CM | POA: Diagnosis not present

## 2018-05-13 DIAGNOSIS — G35 Multiple sclerosis: Secondary | ICD-10-CM | POA: Diagnosis not present

## 2018-05-13 DIAGNOSIS — K3184 Gastroparesis: Secondary | ICD-10-CM | POA: Diagnosis not present

## 2018-05-13 NOTE — Telephone Encounter (Signed)
Leta Jungling called regarding this patient and the facility she is at needing an order from Dr. Tomi Likens to discontinue the Rebif injections due to them being too expensive. Thanks

## 2018-05-14 NOTE — Telephone Encounter (Signed)
Called and spoke with Lenita, advised her to have assisted living fax [place] concerning this.

## 2018-05-28 ENCOUNTER — Telehealth: Payer: Self-pay | Admitting: Neurology

## 2018-05-28 NOTE — Telephone Encounter (Signed)
Called and spoke with Minna Merritts, advised her P/T is ok

## 2018-06-02 NOTE — Telephone Encounter (Signed)
error 

## 2018-07-22 ENCOUNTER — Encounter: Payer: Self-pay | Admitting: Internal Medicine

## 2018-07-22 ENCOUNTER — Ambulatory Visit (INDEPENDENT_AMBULATORY_CARE_PROVIDER_SITE_OTHER): Payer: Medicare Other | Admitting: Internal Medicine

## 2018-07-22 VITALS — BP 100/68 | HR 96 | Temp 98.3°F | Wt 137.0 lb

## 2018-07-22 DIAGNOSIS — I10 Essential (primary) hypertension: Secondary | ICD-10-CM | POA: Diagnosis not present

## 2018-07-22 DIAGNOSIS — N393 Stress incontinence (female) (male): Secondary | ICD-10-CM

## 2018-07-22 DIAGNOSIS — G35 Multiple sclerosis: Secondary | ICD-10-CM

## 2018-07-22 DIAGNOSIS — M779 Enthesopathy, unspecified: Secondary | ICD-10-CM

## 2018-07-22 DIAGNOSIS — M778 Other enthesopathies, not elsewhere classified: Secondary | ICD-10-CM

## 2018-07-22 NOTE — Patient Instructions (Addendum)
Limit your sodium (Salt) intake  Neurology follow-up as scheduled  Return in 6 months for follow-up  Follow these instructions at home:  Normal daily hygiene and the use of pads or adult diapers that are changed regularly will help prevent odors and skin damage.  Avoid caffeine. It can overstimulate your bladder.  Use the bathroom regularly. Try about every 2-3 hours to go to the bathroom, even if you do not feel the need to do so. Take time to empty your bladder completely. After urinating, wait a minute. Then try to urinate again.  For causes involving nerve dysfunction, keep a log of the medicines you take and a journal of the times you go to the bathroom.

## 2018-07-22 NOTE — Progress Notes (Signed)
Subjective:    Patient ID: Stephanie Zhang, female    DOB: Sep 01, 1961, 57 y.o.   MRN: [id number]  HPI  57 year old patient who has a history of MS and is followed by neurology.  Complaints today include a painful left third finger.  She also has noted occasional numbness involving her right great toe.  In general doing fairly well.  She is scheduled for neurology follow-up in November.  Complaints also include some occasional stress incontinence mainly triggered by laughing.  Past Medical History:  Diagnosis Date  . Anxiety   . Blood transfusion    "when I was born"  . Breast cancer ([place]) 10/2001   Stage II (right side)  . Chronic back pain   . Confusion   . Depression   . Gastroparesis   . GERD (gastroesophageal reflux disease)   . Headache(784.0)    "lots"  . Hypoglycemia   . MS (multiple sclerosis) ([place]) 1987  . Multiple sclerosis   . Osteoporosis   . Short-term memory loss      Social History   Socioeconomic History  . Marital status: Widowed    Spouse name: Not on file  . Number of children: Not on file  . Years of education: Not on file  . Highest education level: Not on file  Occupational History  . Not on file  Social Needs  . Financial resource strain: Not on file  . Food insecurity:    Worry: Not on file    Inability: Not on file  . Transportation needs:    Medical: Not on file    Non-medical: Not on file  Tobacco Use  . Smoking status: Former Smoker    Last attempt to quit: 05/11/2011    Years since quitting: 7.2  . Smokeless tobacco: Never Used  Substance and Sexual Activity  . Alcohol use: No    Comment: "last alcohol 12/04/11"  . Drug use: No    Types: LSD, Marijuana, "Crack" cocaine    Comment: "last drug use ~ 1980"  . Sexual activity: Never  Lifestyle  . Physical activity:    Days per week: Not on file    Minutes per session: Not on file  . Stress: Not on file  Relationships  . Social connections:    Talks on phone: Not on file   Gets together: Not on file    Attends religious service: Not on file    Active member of club or organization: Not on file    Attends meetings of clubs or organizations: Not on file    Relationship status: Not on file  . Intimate partner violence:    Fear of current or ex partner: Not on file    Emotionally abused: Not on file    Physically abused: Not on file    Forced sexual activity: Not on file  Other Topics Concern  . Not on file  Social History Narrative  . Not on file    Past Surgical History:  Procedure Laterality Date  . BREAST BIOPSY  2002   right  . BREAST LUMPECTOMY  2002   right  . COLONOSCOPY N/A 04/21/2013   Procedure: COLONOSCOPY;  Surgeon: Ladene Artist, MD;  Location: [facility];  Service: Endoscopy;  Laterality: N/A;    Family History  Problem Relation Age of Onset  . Diabetes Mother   . Cancer Mother        cervical cancer  . Heart disease Mother   . Diabetes Father  Allergies  Allergen Reactions  . Amantadines   . Sulfa Antibiotics Itching and Rash  . Sulfacetamide Sodium Itching and Rash    Current Outpatient Medications on File Prior to Visit  Medication Sig Dispense Refill  . acetaminophen (TYLENOL) 500 MG tablet Take 1,000 mg by mouth every 6 (six) hours as needed for mild pain.    [name] buPROPion (WELLBUTRIN) 75 MG tablet Take 2 tablets (150 mg total) by mouth daily. 60 tablet 5  . Calcium Carbonate-Vitamin D 600-400 MG-UNIT tablet Take 1 tablet by mouth daily. 30 tablet 5  . Cholecalciferol (VITAMIN D) 2000 units tablet GIVE 1 TAB BY MOUTH EVERY DAY 30 tablet 10  . ciprofloxacin (CIPRO) 500 MG tablet Take 1 tablet (500 mg total) by mouth 2 (two) times daily. 6 tablet 0  . docusate sodium (COLACE) 100 MG capsule Take 1 capsule (100 mg total) by mouth 2 (two) times daily. 180 capsule 1  . famotidine (PEPCID) 20 MG tablet Take 1 tablet (20 mg total) by mouth at bedtime. 30 tablet 11  . fluticasone (FLONASE) 50 MCG/ACT nasal spray Place 2  sprays into both nostrils daily. 16 g 2  . guaiFENesin (ROBITUSSIN) 100 MG/5ML liquid Take 200 mg by mouth every 6 (six) hours as needed for cough.    . hydrocortisone cream 1 % Apply twice daily to affected areas 45 g 0  . ibuprofen (ADVIL,MOTRIN) 800 MG tablet Take by mouth.    [name] lisinopril (PRINIVIL,ZESTRIL) 5 MG tablet Take 1 tablet (5 mg total) by mouth daily. 30 tablet 3  . loperamide (IMODIUM) 2 MG capsule Take 2 mg by mouth every 4 (four) hours as needed for diarrhea or loose stools.     [name] loratadine (CLARITIN) 10 MG tablet Take 1 tablet (10 mg total) by mouth daily. 30 tablet 2  . polyethylene glycol (MIRALAX / GLYCOLAX) packet Take 17 g by mouth daily. 100 each 11  . promethazine (PHENERGAN) 25 MG tablet Take 25 mg by mouth every 6 (six) hours as needed for nausea or vomiting.    [name] REBIF 44 MCG/0.5ML SOSY injection INJECT 0.5 MLS (44 MCG TOTAL) INTO THE SKIN 3 (THREE) TIMES A WEEK. 12 Syringe 11  . REBIF 44 MCG/0.5ML SOSY injection INJECT ONE SYRINGE SUBCUTANEOUSLY THREE TIMES PER WEEK. KEEP REFRIGERATED. 12 Syringe 6  . senna (SENOKOT) 8.6 MG tablet Take by mouth.    . Skin Protectants, Misc. (EUCERIN) cream Apply 1 application topically 2 (two) times daily as needed for dry skin. 454 g 0   No current facility-administered medications on file prior to visit.     BP 100/68 (BP Location: Right Arm, Patient Position: Sitting, Cuff Size: Normal)   Pulse 96   Temp 98.3 F (36.8 C) (Oral)   Wt 137 lb (62.1 kg)   LMP 08/09/2008   SpO2 97%   BMI 23.52 kg/m     Review of Systems  Constitutional: Negative.   HENT: Negative for congestion, dental problem, hearing loss, rhinorrhea, sinus pressure, sore throat and tinnitus.   Eyes: Negative for pain, discharge and visual disturbance.  Respiratory: Negative for cough and shortness of breath.   Cardiovascular: Negative for chest pain, palpitations and leg swelling.  Gastrointestinal: Negative for abdominal distention, abdominal pain,  blood in stool, constipation, diarrhea, nausea and vomiting.  Genitourinary: Negative for difficulty urinating, dysuria, flank pain, frequency, hematuria, pelvic pain, urgency, vaginal bleeding, vaginal discharge and vaginal pain.  Musculoskeletal: Negative for arthralgias, gait problem and joint swelling.  Pain involving the left third finger  Skin: Negative for rash.  Neurological: Negative for dizziness, syncope, speech difficulty, weakness, numbness and headaches.       Numbness involving the right great toe  Hematological: Negative for adenopathy.  Psychiatric/Behavioral: Negative for agitation, behavioral problems and dysphoric mood. The patient is not nervous/anxious.        Objective:   Physical Exam  Constitutional: She is oriented to person, place, and time. She appears well-developed and well-nourished.  HENT:  Head: Normocephalic.  Right Ear: External ear normal.  Left Ear: External ear normal.  Mouth/Throat: Oropharynx is clear and moist.  Eyes: Pupils are equal, round, and reactive to light. Conjunctivae and EOM are normal.  Neck: Normal range of motion. Neck supple. No thyromegaly present.  Cardiovascular: Normal rate, regular rhythm, normal heart sounds and intact distal pulses.  Pulmonary/Chest: Effort normal and breath sounds normal.  Abdominal: Soft. Bowel sounds are normal. She exhibits no mass. There is no tenderness.  Musculoskeletal: Normal range of motion.  Examination of the left third finger revealed some tenderness with flexion against resistance.  No soft tissue swelling Right toe was unremarkable did have some decreased sensation to soft touch Onychomycotic toenail changes noted  Lymphadenopathy:    She has no cervical adenopathy.  Neurological: She is alert and oriented to person, place, and time.  Skin: Skin is warm and dry. No rash noted.  Psychiatric: She has a normal mood and affect. Her behavior is normal.          Assessment & Plan:    Left third finger pain.  Probable tendinitis Numbness of the right great toe nonspecific. History of MS.  Follow-up neurology Stress incontinence.  Will observe at this time Essential hypertension well-controlled  Marletta Lor

## 2018-08-20 ENCOUNTER — Ambulatory Visit: Payer: Medicare Other | Admitting: Internal Medicine

## 2018-09-17 ENCOUNTER — Other Ambulatory Visit: Payer: Self-pay | Admitting: *Deleted

## 2018-09-17 DIAGNOSIS — G3184 Mild cognitive impairment, so stated: Secondary | ICD-10-CM

## 2018-09-17 DIAGNOSIS — R4189 Other symptoms and signs involving cognitive functions and awareness: Secondary | ICD-10-CM

## 2018-09-17 DIAGNOSIS — G35 Multiple sclerosis: Secondary | ICD-10-CM

## 2018-09-23 ENCOUNTER — Encounter: Payer: Self-pay | Admitting: Adult Health

## 2018-09-23 ENCOUNTER — Ambulatory Visit (INDEPENDENT_AMBULATORY_CARE_PROVIDER_SITE_OTHER): Payer: Medicare Other | Admitting: Adult Health

## 2018-09-23 VITALS — BP 110/76 | Temp 98.6°F | Wt 138.0 lb

## 2018-09-23 DIAGNOSIS — R110 Nausea: Secondary | ICD-10-CM

## 2018-09-23 LAB — POCT URINALYSIS DIPSTICK
Bilirubin, UA: NEGATIVE
Glucose, UA: NEGATIVE
Ketones, UA: NEGATIVE
NITRITE UA: NEGATIVE
PH UA: 6 (ref 5.0–8.0)
PROTEIN UA: POSITIVE — AB
RBC UA: NEGATIVE
Spec Grav, UA: 1.02 (ref 1.010–1.025)
UROBILINOGEN UA: 0.2 U/dL

## 2018-09-23 MED ORDER — ONDANSETRON HCL 4 MG PO TABS: 4.0000 mg | ORAL_TABLET | Freq: Three times a day (TID) | ORAL | 0 refills | Status: DC | PRN

## 2018-09-23 NOTE — Progress Notes (Signed)
Subjective:    Patient ID: Stephanie Zhang, female    DOB: [birth year], 57 y.o.   MRN: [id number]  HPI 57 year old female who  has a past medical history of Anxiety, Blood transfusion, Breast cancer ([place]) (10/2001), Chronic back pain, Confusion, Depression, Gastroparesis, GERD (gastroesophageal reflux disease), Headache(784.0), Hypoglycemia, MS (multiple sclerosis) ([place]) (1987), Multiple sclerosis, Osteoporosis, and Short-term memory loss.  She presents to the office today for an acute issue of diarrhea, nausea, headache, and abdominal pain x 2-3 days. She denies any fevers or vomiting.   She reports up to 3 episodes of diarrhea/daily.   Does have loss of appetite.    Review of Systems  Constitutional: Positive for activity change, appetite change and fatigue.  HENT: Negative.   Respiratory: Negative.   Cardiovascular: Negative.   Gastrointestinal: Positive for abdominal pain, diarrhea and nausea. Negative for abdominal distention, blood in stool and vomiting.  All other systems reviewed and are negative.  Past Medical History:  Diagnosis Date  . Anxiety   . Blood transfusion    "when I was born"  . Breast cancer ([place]) 10/2001   Stage II (right side)  . Chronic back pain   . Confusion   . Depression   . Gastroparesis   . GERD (gastroesophageal reflux disease)   . Headache(784.0)    "lots"  . Hypoglycemia   . MS (multiple sclerosis) ([place]) 1987  . Multiple sclerosis   . Osteoporosis   . Short-term memory loss     Social History   Socioeconomic History  . Marital status: Widowed    Spouse name: Not on file  . Number of children: Not on file  . Years of education: Not on file  . Highest education level: Not on file  Occupational History  . Not on file  Social Needs  . Financial resource strain: Not on file  . Food insecurity:    Worry: Not on file    Inability: Not on file  . Transportation needs:    Medical: Not on file    Non-medical: Not on file  Tobacco  Use  . Smoking status: Former Smoker    Last attempt to quit: 05/11/2011    Years since quitting: 7.3  . Smokeless tobacco: Never Used  Substance and Sexual Activity  . Alcohol use: No    Comment: "last alcohol 12/04/11"  . Drug use: No    Types: LSD, Marijuana, "Crack" cocaine    Comment: "last drug use ~ 1980"  . Sexual activity: Never  Lifestyle  . Physical activity:    Days per week: Not on file    Minutes per session: Not on file  . Stress: Not on file  Relationships  . Social connections:    Talks on phone: Not on file    Gets together: Not on file    Attends religious service: Not on file    Active member of club or organization: Not on file    Attends meetings of clubs or organizations: Not on file    Relationship status: Not on file  . Intimate partner violence:    Fear of current or ex partner: Not on file    Emotionally abused: Not on file    Physically abused: Not on file    Forced sexual activity: Not on file  Other Topics Concern  . Not on file  Social History Narrative  . Not on file    Past Surgical History:  Procedure Laterality Date  . BREAST  BIOPSY  2002   right  . BREAST LUMPECTOMY  2002   right  . COLONOSCOPY N/A 04/21/2013   Procedure: COLONOSCOPY;  Surgeon: Ladene Artist, MD;  Location: [facility];  Service: Endoscopy;  Laterality: N/A;    Family History  Problem Relation Age of Onset  . Diabetes Mother   . Cancer Mother        cervical cancer  . Heart disease Mother   . Diabetes Father     Allergies  Allergen Reactions  . Amantadines   . Sulfa Antibiotics Itching and Rash  . Sulfacetamide Sodium Itching and Rash    Current Outpatient Medications on File Prior to Visit  Medication Sig Dispense Refill  . acetaminophen (TYLENOL) 500 MG tablet Take 1,000 mg by mouth every 6 (six) hours as needed for mild pain.    [name] buPROPion (WELLBUTRIN) 75 MG tablet Take 2 tablets (150 mg total) by mouth daily. 60 tablet 5  . Calcium  Carbonate-Vitamin D 600-400 MG-UNIT tablet Take 1 tablet by mouth daily. 30 tablet 5  . Cholecalciferol (VITAMIN D) 2000 units tablet GIVE 1 TAB BY MOUTH EVERY DAY 30 tablet 10  . ciprofloxacin (CIPRO) 500 MG tablet Take 1 tablet (500 mg total) by mouth 2 (two) times daily. 6 tablet 0  . docusate sodium (COLACE) 100 MG capsule Take 1 capsule (100 mg total) by mouth 2 (two) times daily. 180 capsule 1  . famotidine (PEPCID) 20 MG tablet Take 1 tablet (20 mg total) by mouth at bedtime. 30 tablet 11  . fluticasone (FLONASE) 50 MCG/ACT nasal spray Place 2 sprays into both nostrils daily. 16 g 2  . guaiFENesin (ROBITUSSIN) 100 MG/5ML liquid Take 200 mg by mouth every 6 (six) hours as needed for cough.    . hydrocortisone cream 1 % Apply twice daily to affected areas 45 g 0  . ibuprofen (ADVIL,MOTRIN) 800 MG tablet Take by mouth.    [name] lisinopril (PRINIVIL,ZESTRIL) 5 MG tablet Take 1 tablet (5 mg total) by mouth daily. 30 tablet 3  . loperamide (IMODIUM) 2 MG capsule Take 2 mg by mouth every 4 (four) hours as needed for diarrhea or loose stools.     [name] loratadine (CLARITIN) 10 MG tablet Take 1 tablet (10 mg total) by mouth daily. 30 tablet 2  . polyethylene glycol (MIRALAX / GLYCOLAX) packet Take 17 g by mouth daily. 100 each 11  . promethazine (PHENERGAN) 25 MG tablet Take 25 mg by mouth every 6 (six) hours as needed for nausea or vomiting.    . senna (SENOKOT) 8.6 MG tablet Take by mouth.    . Skin Protectants, Misc. (EUCERIN) cream Apply 1 application topically 2 (two) times daily as needed for dry skin. 454 g 0   No current facility-administered medications on file prior to visit.     BP 110/76   Temp 98.6 F (37 C) (Oral)   Wt 138 lb (62.6 kg)   LMP 08/09/2008   BMI 23.69 kg/m       Objective:   Physical Exam  Constitutional: She is oriented to person, place, and time. She appears well-developed and well-nourished. No distress.  Cardiovascular: Normal rate, regular rhythm, normal heart  sounds and intact distal pulses.  Pulmonary/Chest: Effort normal and breath sounds normal.  Abdominal: Soft. Bowel sounds are normal.  Neurological: She is alert and oriented to person, place, and time.  Skin: Skin is warm and dry. She is not diaphoretic.  Vitals reviewed.  Assessment & Plan:

## 2018-09-23 NOTE — Patient Instructions (Addendum)
It was great seeing you today   I think you have a stomach bug, which is likely viral   I have prescribed Zofran to take   I would also like you take pepto bismol twice a day for the next 3 days   Also eat a bland diet for the next 72 hours

## 2018-09-25 ENCOUNTER — Other Ambulatory Visit: Payer: Self-pay | Admitting: Adult Health

## 2018-09-25 LAB — URINE CULTURE
MICRO NUMBER:: 91238502
SPECIMEN QUALITY: ADEQUATE

## 2018-10-01 ENCOUNTER — Other Ambulatory Visit: Payer: Self-pay | Admitting: Family Medicine

## 2018-10-01 MED ORDER — CIPROFLOXACIN HCL 500 MG PO TABS: 500.0000 mg | ORAL_TABLET | Freq: Two times a day (BID) | ORAL | 0 refills | Status: DC

## 2018-10-02 ENCOUNTER — Telehealth: Payer: Self-pay | Admitting: Adult Health

## 2018-10-02 ENCOUNTER — Encounter: Payer: Self-pay | Admitting: Family Medicine

## 2018-10-02 NOTE — Telephone Encounter (Signed)
Copied from [place] [id number]. Topic: Quick Communication - See Telephone Encounter >> Oct 02, 2018 10:24 AM Ahmed Prima L wrote: CRM for notification. See Telephone encounter for: 10/02/18.  [facility] needs an order to give her ciprofloxacin (CIPRO) 500 MG tablet given by Tommi Rumps. Fax number [phone].

## 2018-10-02 NOTE — Telephone Encounter (Signed)
Order has been faxed.  Received confirmation the fax was successful.  No further action required.

## 2018-10-03 ENCOUNTER — Ambulatory Visit (INDEPENDENT_AMBULATORY_CARE_PROVIDER_SITE_OTHER): Payer: Medicare Other | Admitting: Family Medicine

## 2018-10-03 ENCOUNTER — Encounter: Payer: Self-pay | Admitting: Family Medicine

## 2018-10-03 VITALS — BP 106/78 | HR 94 | Temp 98.5°F | Ht 64.0 in | Wt 136.0 lb

## 2018-10-03 DIAGNOSIS — G35 Multiple sclerosis: Secondary | ICD-10-CM | POA: Diagnosis not present

## 2018-10-03 DIAGNOSIS — I10 Essential (primary) hypertension: Secondary | ICD-10-CM | POA: Diagnosis not present

## 2018-10-03 NOTE — Progress Notes (Signed)
Subjective:    Patient ID: Stephanie Zhang, female    DOB: 1961/04/04, 57 y.o.   MRN: [id number]  No chief complaint on file. Patient accompanied by her sister.  HPI Patient was seen today for follow-up on chronic conditions and TOC, previously seen by Dr. Burnice Logan.  Pt was seen recently for GI symptoms,  dx'd with UTI, completed course of ciprofloxacin.  Pt currently at an assisted living facility.  BP controlled on lisinopril 5 mg daily.  Bp ranges from low 100s-145/70s-80s.  Pt followed by neurology for MS.  Next appointment in a few wks.  Has a h/o urinary incontinence.  Past Medical History:  Diagnosis Date  . Anxiety   . Blood transfusion    "when I was born"  . Breast cancer ([place]) 10/2001   Stage II (right side)  . Chronic back pain   . Confusion   . Depression   . Gastroparesis   . GERD (gastroesophageal reflux disease)   . Headache(784.0)    "lots"  . Hypoglycemia   . MS (multiple sclerosis) ([place]) 1987  . Multiple sclerosis   . Osteoporosis   . Short-term memory loss     Allergies  Allergen Reactions  . Amantadines   . Sulfa Antibiotics Itching and Rash  . Sulfacetamide Sodium Itching and Rash    ROS General: Denies fever, chills, night sweats, changes in weight, changes in appetite HEENT: Denies headaches, ear pain, changes in vision, rhinorrhea, sore throat CV: Denies CP, palpitations, SOB, orthopnea Pulm: Denies SOB, cough, wheezing GI: Denies abdominal pain, nausea, vomiting, diarrhea, constipation GU: Denies dysuria, hematuria, frequency, vaginal discharge Msk: Denies muscle cramps, joint pains Neuro: Denies weakness, numbness, tingling Skin: Denies rashes, bruising Psych: Denies depression, anxiety, hallucinations    Objective:    Blood pressure 106/78, pulse 94, temperature 98.5 F (36.9 C), temperature source Oral, height 5\' 4"  (1.626 m), weight 136 lb (61.7 kg), last menstrual period 08/09/2008, SpO2 98 %.  Gen. Pleasant, well-nourished, in no  distress, normal affect   HEENT: [place]/AT, face symmetric, no scleral icterus, PERRLA, nares patent without drainage Lungs: no accessory muscle use, CTAB, no wheezes or rales Cardiovascular: RRR, no m/r/g, no peripheral edema Neuro:  A&Ox3, CN II-XII intact, using walker for ambulation  Wt Readings from Last 3 Encounters:  09/23/18 138 lb (62.6 kg)  07/22/18 137 lb (62.1 kg)  04/11/18 125 lb (56.7 kg)    Lab Results  Component Value Date   WBC 4.7 10/17/2017   HGB 11.2 10/17/2017   HCT 35.0 10/17/2017   PLT 124 (L) 06/22/2017   GLUCOSE 141 (H) 06/22/2017   CHOL 153 12/14/2016   TRIG 92.0 12/14/2016   HDL 62.80 12/14/2016   LDLCALC 72 12/14/2016   ALT 12 10/17/2017   AST 16 10/17/2017   NA 139 06/22/2017   K 4.0 06/22/2017   CL 108 06/22/2017   CREATININE 0.80 06/22/2017   BUN 16 06/22/2017   CO2 23 06/22/2017   TSH 2.25 04/12/2017    Assessment/Plan:  Essential hypertension -controlled. -continue lisinopril 5 mg daily -continue lifestyle modifications  MS (multiple sclerosis) ([place]) -continue follow up with Neurology  Follow-up PRN in the next 3-4 months, sooner if needed  Grier Mitts, MD

## 2018-10-03 NOTE — Patient Instructions (Signed)
Managing Your Hypertension Hypertension is commonly called high blood pressure. This is when the force of your blood pressing against the walls of your arteries is too strong. Arteries are blood vessels that carry blood from your heart throughout your body. Hypertension forces the heart to work harder to pump blood, and may cause the arteries to become narrow or stiff. Having untreated or uncontrolled hypertension can cause heart attack, stroke, kidney disease, and other problems. What are blood pressure readings? A blood pressure reading consists of a higher number over a lower number. Ideally, your blood pressure should be below 120/80. The first ("top") number is called the systolic pressure. It is a measure of the pressure in your arteries as your heart beats. The second ("bottom") number is called the diastolic pressure. It is a measure of the pressure in your arteries as the heart relaxes. What does my blood pressure reading mean? Blood pressure is classified into four stages. Based on your blood pressure reading, your health care provider may use the following stages to determine what type of treatment you need, if any. Systolic pressure and diastolic pressure are measured in a unit called mm Hg. Normal  Systolic pressure: below 120.  Diastolic pressure: below 80. Elevated  Systolic pressure: 120-129.  Diastolic pressure: below 80. Hypertension stage 1  Systolic pressure: 130-139.  Diastolic pressure: 80-89. Hypertension stage 2  Systolic pressure: 140 or above.  Diastolic pressure: 90 or above. What health risks are associated with hypertension? Managing your hypertension is an important responsibility. Uncontrolled hypertension can lead to:  A heart attack.  A stroke.  A weakened blood vessel (aneurysm).  Heart failure.  Kidney damage.  Eye damage.  Metabolic syndrome.  Memory and concentration problems.  What changes can I make to manage my  hypertension? Hypertension can be managed by making lifestyle changes and possibly by taking medicines. Your health care provider will help you make a plan to bring your blood pressure within a normal range. Eating and drinking  Eat a diet that is high in fiber and potassium, and low in salt (sodium), added sugar, and fat. An example eating plan is called the DASH (Dietary Approaches to Stop Hypertension) diet. To eat this way: ? Eat plenty of fresh fruits and vegetables. Try to fill half of your plate at each meal with fruits and vegetables. ? Eat whole grains, such as whole wheat pasta, brown rice, or whole grain bread. Fill about one quarter of your plate with whole grains. ? Eat low-fat diary products. ? Avoid fatty cuts of meat, processed or cured meats, and poultry with skin. Fill about one quarter of your plate with lean proteins such as fish, chicken without skin, beans, eggs, and tofu. ? Avoid premade and processed foods. These tend to be higher in sodium, added sugar, and fat.  Reduce your daily sodium intake. Most people with hypertension should eat less than 1,500 mg of sodium a day.  Limit alcohol intake to no more than 1 drink a day for nonpregnant women and 2 drinks a day for men. One drink equals 12 oz of beer, 5 oz of wine, or 1 oz of hard liquor. Lifestyle  Work with your health care provider to maintain a healthy body weight, or to lose weight. Ask what an ideal weight is for you.  Get at least 30 minutes of exercise that causes your heart to beat faster (aerobic exercise) most days of the week. Activities may include walking, swimming, or biking.  Include exercise   to strengthen your muscles (resistance exercise), such as weight lifting, as part of your weekly exercise routine. Try to do these types of exercises for 30 minutes at least 3 days a week.  Do not use any products that contain nicotine or tobacco, such as cigarettes and e-cigarettes. If you need help quitting, ask  your health care provider.  Control any long-term (chronic) conditions you have, such as high cholesterol or diabetes. Monitoring  Monitor your blood pressure at home as told by your health care provider. Your personal target blood pressure may vary depending on your medical conditions, your age, and other factors.  Have your blood pressure checked regularly, as often as told by your health care provider. Working with your health care provider  Review all the medicines you take with your health care provider because there may be side effects or interactions.  Talk with your health care provider about your diet, exercise habits, and other lifestyle factors that may be contributing to hypertension.  Visit your health care provider regularly. Your health care provider can help you create and adjust your plan for managing hypertension. Will I need medicine to control my blood pressure? Your health care provider may prescribe medicine if lifestyle changes are not enough to get your blood pressure under control, and if:  Your systolic blood pressure is 130 or higher.  Your diastolic blood pressure is 80 or higher.  Take medicines only as told by your health care provider. Follow the directions carefully. Blood pressure medicines must be taken as prescribed. The medicine does not work as well when you skip doses. Skipping doses also puts you at risk for problems. Contact a health care provider if:  You think you are having a reaction to medicines you have taken.  You have repeated (recurrent) headaches.  You feel dizzy.  You have swelling in your ankles.  You have trouble with your vision. Get help right away if:  You develop a severe headache or confusion.  You have unusual weakness or numbness, or you feel faint.  You have severe pain in your chest or abdomen.  You vomit repeatedly.  You have trouble breathing. Summary  Hypertension is when the force of blood pumping through  your arteries is too strong. If this condition is not controlled, it may put you at risk for serious complications.  Your personal target blood pressure may vary depending on your medical conditions, your age, and other factors. For most people, a normal blood pressure is less than 120/80.  Hypertension is managed by lifestyle changes, medicines, or both. Lifestyle changes include weight loss, eating a healthy, low-sodium diet, exercising more, and limiting alcohol. This information is not intended to replace advice given to you by your health care provider. Make sure you discuss any questions you have with your health care provider. Document Released: 08/20/2012 Document Revised: 10/24/2016 Document Reviewed: 10/24/2016 Elsevier Interactive Patient Education  2018 [employer]. Multiple Sclerosis Multiple sclerosis (MS) is a disease of the central nervous system. It leads to the loss of the insulating covering of the nerves (myelin sheath) of your brain. When this happens, brain signals do not get sent properly or may not get sent at all. The age of onset of MS varies. What are the causes? The cause of MS is unknown. However, it is more common in the [place] than in the [place]. What increases the risk? There is a higher number of women with MS than men. MS is not  an illness that is passed down to you from your family members (inherited). However, your risk of MS is higher if you have a relative with MS. What are the signs or symptoms? The symptoms of MS occur in episodes or attacks. These attacks may last weeks to months. There may be long periods of almost no symptoms between attacks. The symptoms of MS vary. This is because of the many different ways it affects the central nervous system. The main symptoms of MS include:  Vision problems and eye pain.  Numbness.  Weakness.  Inability to move your arms, hands, feet, or legs (paralysis).  Balance  problems.  Tremors.  How is this diagnosed? Your health care provider can diagnose MS with the help of imaging exams and lab tests. These may include specialized X-ray exams and spinal fluid tests. The best imaging exam to confirm a diagnosis of MS is an MRI. How is this treated? There is no known cure for MS, but there are medicines that can decrease the number and frequency of attacks. Steroids are often used for short-term relief. Physical and occupational therapy may also help. There are also many new alternative or complementary treatments available to help control the symptoms of MS. Ask your health care provider if any of these other options are right for you. Follow these instructions at home:  Take medicines as directed by your health care provider.  Exercise as directed by your health care provider. Contact a health care provider if: You begin to feel depressed. Get help right away if:  You develop paralysis.  You have problems with bladder, bowel, or sexual function.  You develop mental changes, such as forgetfulness or mood swings.  You have a period of uncontrolled movements (seizure). This information is not intended to replace advice given to you by your health care provider. Make sure you discuss any questions you have with your health care provider. Document Released: 11/23/2000 Document Revised: 05/03/2016 Document Reviewed: 08/03/2013 Elsevier Interactive Patient Education  2017 [employer].

## 2018-10-06 ENCOUNTER — Telehealth: Payer: Self-pay

## 2018-10-06 ENCOUNTER — Ambulatory Visit
Admission: RE | Admit: 2018-10-06 | Discharge: 2018-10-06 | Disposition: A | Discharge status: Home / Self Care | Payer: Medicare Other | Source: Ambulatory Visit | Attending: Neurology | Admitting: Neurology

## 2018-10-06 DIAGNOSIS — G35 Multiple sclerosis: Secondary | ICD-10-CM

## 2018-10-06 DIAGNOSIS — G3184 Mild cognitive impairment, so stated: Secondary | ICD-10-CM

## 2018-10-06 DIAGNOSIS — R4189 Other symptoms and signs involving cognitive functions and awareness: Secondary | ICD-10-CM

## 2018-10-06 DIAGNOSIS — D329 Benign neoplasm of meninges, unspecified: Secondary | ICD-10-CM | POA: Diagnosis not present

## 2018-10-06 MED ORDER — GADOBENATE DIMEGLUMINE 529 MG/ML IV SOLN
12.0000 mL | Freq: Once | INTRAVENOUS | Status: AC | PRN
  Administered 2018-10-06: 12 mL via INTRAVENOUS

## 2018-10-06 NOTE — Telephone Encounter (Signed)
-----   Message from Stephanie Berthold, DO sent at 10/06/2018  3:25 PM EDT ----- Please inform patient that her MRI brain shows stable MS, no new disease activity.

## 2018-10-06 NOTE — Telephone Encounter (Signed)
Called LMOVM for Pt to return my call 

## 2018-10-08 NOTE — Telephone Encounter (Signed)
Called and LMOVM for Pt to return my call 

## 2018-10-09 ENCOUNTER — Telehealth: Payer: Self-pay | Admitting: Neurology

## 2018-10-09 NOTE — Telephone Encounter (Signed)
Stephanie Zhang was returning a phone call and left a msg with after hours to speak with you. Please call her back at [phone]. Thanks!

## 2018-10-09 NOTE — Telephone Encounter (Signed)
Called and spoke wth Laurence Aly, she was calling to let me know she rcvd my message concerning the MRI results

## 2018-10-13 NOTE — Progress Notes (Signed)
NEUROLOGY FOLLOW UP OFFICE NOTE  Stephanie Zhang [id number]  HISTORY OF PRESENT ILLNESS: Stephanie Zhang is a 57 year old female with chronic back pain, osteoporosis, GERD, thrombocytopenia and history of breast cancer who follows up for multiple sclerosis.  She is accompanied by her sister who supplements history.  UPDATE: Repeat MRI of brain with and without contrast from 10/07/18 was personally reviewed and stable compared to prior MRI from 10/22/17.  Current DMT:  None.   Other medications: Bupropion 75 mg daily for depression, oxybutynin for neurogenic bladder, lorazepam 0.5 mg as needed for anxiety, D3 2000 IU daily  Since last year, she reports she is physically stable.  Vision: No issues Motor: No issues Sensory: No issues Pain: No issues Gait: Able to ambulate with walker Bowel/Bladder: Stress incontinence.   Fatigue: Yes Cognition: Short-term memory deficits.  Does not remember certain family members are dead.  Continues to be delusional (still does not believe that her ex-husband is dead.  No recent visual hallucinations such as involving animals.   Mood: She is short-tempered.    HISTORY: She was diagnosed with multiple sclerosis at around age 48.  She reports left sided numbness, ataxia, and visual disturbance.  She had bilateral INO.  She did undergo a lumbar puncture at the time.[name]  She was initially on Avonex, which was switched to Rebif in 2004 due to change in insurance.  Rebif was subsequently denied due to diagnosis of SPMS.  She has required treatment of flare-ups with Solu-Medrol, usually presenting as worsening gait with falls.  As per prior neurologist's notes, her last steroid treatment was in 2010.  She has had a progressive physical decline since then.  Her ex-husband passed away on 2017/01/06.  Since then, she reports visual and auditory hallucinations.  In the middle of the night, she may see and hear her ex-husband outside the window.  On at least one  occasion, she woke up in the middle of the night and heard knocking on her window.  She saw somebody outside speaking [place].  She told the nurses and they didn't see anyone.  One afternoon, she was walking in the courtyard and saw a [place].  Her friend didn't see anything.  Later, when she was in the dining room, she saw the [place] outside the window.  Sometimes, these hallucinations are frighthening.  They occur 2 to 3 times a week.   She currently lives in assisted living at [facility].  At baseline, she exhibits extra-pyramidal symptoms such as tremor.  She has memory deficits.  She requires use of a walker since 2005.  She has neurogenic bladder.  She needs to push down on her stomach in order to help void.  She sometimes has constipation or diarrhea.  MRI of brain with and without contrast from 09/08/16 reports "no change in the appearance of numerous subcortical, periventricular, and deep white matter T2 hyperintense lesions" when compared to prior imaging from 10/23/08.  However, it also reports mild interval enlargement of a meningioma "in the left frontal dura which measures 5 x 11 mm, previously 2 x 10 mm."  She was referred to neurosurgery for evaluation of meningioma.  Continued monitoring was recommended.  MRI of brain with and without contrast from 10/22/17 was stable compared to prior study from 09/08/16 with no evidence of progression or contrast enhancing lesions.  Left frontal convexity meningioma is stable at 8 by 7 mm.  PAST MEDICAL HISTORY: Past Medical History:  Diagnosis Date  . Anxiety   .  Blood transfusion    "when I was born"  . Breast cancer ([place]) 10/2001   Stage II (right side)  . Chronic back pain   . Confusion   . Depression   . Gastroparesis   . GERD (gastroesophageal reflux disease)   . Headache(784.0)    "lots"  . Hypoglycemia   . MS (multiple sclerosis) ([place]) 1987  . Multiple sclerosis   . Osteoporosis   . Short-term memory loss      MEDICATIONS: Current Outpatient Medications on File Prior to Visit  Medication Sig Dispense Refill  . acetaminophen (TYLENOL) 500 MG tablet Take 1,000 mg by mouth every 6 (six) hours as needed for mild pain.    [name] buPROPion (WELLBUTRIN) 75 MG tablet Take 2 tablets (150 mg total) by mouth daily. 60 tablet 5  . Calcium Carbonate-Vitamin D 600-400 MG-UNIT tablet Take 1 tablet by mouth daily. 30 tablet 5  . Cholecalciferol (VITAMIN D) 2000 units tablet GIVE 1 TAB BY MOUTH EVERY DAY 30 tablet 10  . ciprofloxacin (CIPRO) 500 MG tablet Take 1 tablet (500 mg total) by mouth 2 (two) times daily. 6 tablet 0  . docusate sodium (COLACE) 100 MG capsule Take 1 capsule (100 mg total) by mouth 2 (two) times daily. 180 capsule 1  . famotidine (PEPCID) 20 MG tablet Take 1 tablet (20 mg total) by mouth at bedtime. 30 tablet 11  . fluticasone (FLONASE) 50 MCG/ACT nasal spray Place 2 sprays into both nostrils daily. 16 g 2  . guaiFENesin (ROBITUSSIN) 100 MG/5ML liquid Take 200 mg by mouth every 6 (six) hours as needed for cough.    . hydrocortisone cream 1 % Apply twice daily to affected areas 45 g 0  . lisinopril (PRINIVIL,ZESTRIL) 5 MG tablet Take 1 tablet (5 mg total) by mouth daily. 30 tablet 3  . loperamide (IMODIUM) 2 MG capsule Take 2 mg by mouth every 4 (four) hours as needed for diarrhea or loose stools.     [name] loratadine (CLARITIN) 10 MG tablet Take 1 tablet (10 mg total) by mouth daily. 30 tablet 2  . ondansetron (ZOFRAN) 4 MG tablet Take 1 tablet (4 mg total) by mouth every 8 (eight) hours as needed for nausea or vomiting. 20 tablet 0  . polyethylene glycol (MIRALAX / GLYCOLAX) packet Take 17 g by mouth daily. 100 each 11  . promethazine (PHENERGAN) 25 MG tablet Take 25 mg by mouth every 6 (six) hours as needed for nausea or vomiting.    . senna (SENOKOT) 8.6 MG tablet Take by mouth.    . Skin Protectants, Misc. (EUCERIN) cream Apply 1 application topically 2 (two) times daily as needed for dry skin.  454 g 0   No current facility-administered medications on file prior to visit.     ALLERGIES: Allergies  Allergen Reactions  . Amantadines   . Sulfa Antibiotics Itching and Rash  . Sulfacetamide Sodium Itching and Rash    FAMILY HISTORY: Family History  Problem Relation Age of Onset  . Diabetes Mother   . Cancer Mother        cervical cancer  . Heart disease Mother   . Diabetes Father    SOCIAL HISTORY: Social History   Socioeconomic History  . Marital status: Widowed    Spouse name: Not on file  . Number of children: Not on file  . Years of education: Not on file  . Highest education level: Not on file  Occupational History  . Not on file  Social  Needs  . Financial resource strain: Not on file  . Food insecurity:    Worry: Not on file    Inability: Not on file  . Transportation needs:    Medical: Not on file    Non-medical: Not on file  Tobacco Use  . Smoking status: Former Smoker    Last attempt to quit: 05/11/2011    Years since quitting: 7.4  . Smokeless tobacco: Never Used  Substance and Sexual Activity  . Alcohol use: No    Comment: "last alcohol 12/04/11"  . Drug use: No    Types: LSD, Marijuana, "Crack" cocaine    Comment: "last drug use ~ 1980"  . Sexual activity: Never  Lifestyle  . Physical activity:    Days per week: Not on file    Minutes per session: Not on file  . Stress: Not on file  Relationships  . Social connections:    Talks on phone: Not on file    Gets together: Not on file    Attends religious service: Not on file    Active member of club or organization: Not on file    Attends meetings of clubs or organizations: Not on file    Relationship status: Not on file  . Intimate partner violence:    Fear of current or ex partner: Not on file    Emotionally abused: Not on file    Physically abused: Not on file    Forced sexual activity: Not on file  Other Topics Concern  . Not on file  Social History Narrative  . Not on file     REVIEW OF SYSTEMS: Constitutional: No fevers, chills, or sweats, no generalized fatigue, change in appetite Eyes: No visual changes, double vision, eye pain Ear, nose and throat: No hearing loss, ear pain, nasal congestion, sore throat Cardiovascular: No chest pain, palpitations Respiratory:  No shortness of breath at rest or with exertion, wheezes GastrointestinaI: No nausea, vomiting, diarrhea, abdominal pain, fecal incontinence Genitourinary:  Neurogenic bladder Musculoskeletal:  No neck pain, back pain Integumentary: No rash, pruritus, skin lesions Neurological: as above Psychiatric: depression, delusions Endocrine: No palpitations, fatigue, diaphoresis, mood swings, change in appetite, change in weight, increased thirst Hematologic/Lymphatic:  No purpura, petechiae. Allergic/Immunologic: no itchy/runny eyes, nasal congestion, recent allergic reactions, rashes  PHYSICAL EXAM: Blood pressure 110/74, pulse 89, height 5' 4.5" (1.638 m), weight 137 lb (62.1 kg), last menstrual period 08/09/2008, SpO2 99 %. General: No acute distress.  Patient appears well-groomed.   Head:  Normocephalic/atraumatic Eyes:  Fundi examined but not visualized Neck: supple, no paraspinal tenderness, full range of motion Heart:  Regular rate and rhythm Lungs:  Clear to auscultation bilaterally Back: No paraspinal tenderness Neurological Exam: alert and oriented to person, place, and time. Attention span and concentration intact, recent and remote memory intact, fund of knowledge intact.  Speech fluent and not dysarthric, language intact.  Bilateral internuclear ophthalmoplegia.  Otherwise, CN II-XII intact. Bulk and tone normal, muscle strength 5/5 throughout.  Sensation to light touch intact.  Deep tendon reflexes 2+ throughout, toes upgoing.  Finger-to-nose testing with bilateral tremor and dysmetria, heel-to-shin testing with dysmetria.  In wheelchair.  Unable to ambulate without assistance.  [place] not  tested.  IMPRESSION: 1.  Multiple sclerosis.  Overall stable 2.  Depression.  Depression seems to be main issue at this time. 3.  Cerebral meningioma, stable  PLAN: 1.  Check vitamin D level today and adjust D3 dosing accordingly 2.  Refer to psychiatry regarding depression 3.  Follow up in 6 months.  20 minutes spent face to face with patient, over 50% spent discussing management.  Metta Clines, DO  CC: Grier Mitts, MD

## 2018-10-14 ENCOUNTER — Other Ambulatory Visit: Payer: Medicare Other

## 2018-10-14 ENCOUNTER — Ambulatory Visit (INDEPENDENT_AMBULATORY_CARE_PROVIDER_SITE_OTHER): Payer: Medicare Other | Admitting: Neurology

## 2018-10-14 ENCOUNTER — Other Ambulatory Visit: Payer: Self-pay

## 2018-10-14 ENCOUNTER — Encounter: Payer: Self-pay | Admitting: Neurology

## 2018-10-14 VITALS — BP 110/74 | HR 89 | Ht 64.5 in | Wt 137.0 lb

## 2018-10-14 DIAGNOSIS — R6889 Other general symptoms and signs: Secondary | ICD-10-CM | POA: Diagnosis not present

## 2018-10-14 DIAGNOSIS — D320 Benign neoplasm of cerebral meninges: Secondary | ICD-10-CM

## 2018-10-14 DIAGNOSIS — G35 Multiple sclerosis: Secondary | ICD-10-CM | POA: Diagnosis not present

## 2018-10-14 DIAGNOSIS — F32A Depression, unspecified: Secondary | ICD-10-CM

## 2018-10-14 DIAGNOSIS — F329 Major depressive disorder, single episode, unspecified: Secondary | ICD-10-CM | POA: Diagnosis not present

## 2018-10-14 NOTE — Patient Instructions (Signed)
1.  Continue D3 2000 IU daily.  We will recheck vitamin D level today 2.  I will refer you to psychiatry to address your depression. 3.  Follow up in 6 months.

## 2018-10-15 ENCOUNTER — Other Ambulatory Visit (INDEPENDENT_AMBULATORY_CARE_PROVIDER_SITE_OTHER): Payer: Medicare Other

## 2018-10-15 DIAGNOSIS — R6889 Other general symptoms and signs: Secondary | ICD-10-CM | POA: Diagnosis not present

## 2018-10-15 LAB — VITAMIN D 25 HYDROXY (VIT D DEFICIENCY, FRACTURES): VITD: 41.02 ng/mL (ref 30.00–100.00)

## 2018-10-20 DIAGNOSIS — Z853 Personal history of malignant neoplasm of breast: Secondary | ICD-10-CM | POA: Diagnosis not present

## 2018-10-20 DIAGNOSIS — Z1231 Encounter for screening mammogram for malignant neoplasm of breast: Secondary | ICD-10-CM | POA: Diagnosis not present

## 2018-10-20 LAB — HM MAMMOGRAPHY

## 2018-10-22 ENCOUNTER — Telehealth: Payer: Self-pay

## 2018-10-22 NOTE — Telephone Encounter (Signed)
-----   Message from Pieter Partridge, DO sent at 10/16/2018  9:01 AM EST ----- Vitamin D level a little low.  I would like her to increase D3 from 2000 IU daily to 3000 IU daily.

## 2018-10-22 NOTE — Telephone Encounter (Signed)
Called and LMOVM for Pt to return my call 

## 2018-11-02 ENCOUNTER — Other Ambulatory Visit: Payer: Self-pay

## 2018-11-02 ENCOUNTER — Emergency Department (HOSPITAL_COMMUNITY): Payer: Medicare Other

## 2018-11-02 ENCOUNTER — Encounter (HOSPITAL_COMMUNITY): Payer: Self-pay

## 2018-11-02 ENCOUNTER — Emergency Department (HOSPITAL_COMMUNITY)
Admission: EM | Admit: 2018-11-02 | Discharge: 2018-11-02 | Disposition: A | Discharge status: Home / Self Care | Payer: Medicare Other | Attending: Emergency Medicine | Admitting: Emergency Medicine

## 2018-11-02 DIAGNOSIS — R42 Dizziness and giddiness: Secondary | ICD-10-CM | POA: Diagnosis present

## 2018-11-02 DIAGNOSIS — Z87891 Personal history of nicotine dependence: Secondary | ICD-10-CM | POA: Diagnosis not present

## 2018-11-02 DIAGNOSIS — Z79899 Other long term (current) drug therapy: Secondary | ICD-10-CM | POA: Insufficient documentation

## 2018-11-02 DIAGNOSIS — N3090 Cystitis, unspecified without hematuria: Secondary | ICD-10-CM | POA: Diagnosis not present

## 2018-11-02 DIAGNOSIS — R221 Localized swelling, mass and lump, neck: Secondary | ICD-10-CM | POA: Diagnosis not present

## 2018-11-02 DIAGNOSIS — Y92129 Unspecified place in nursing home as the place of occurrence of the external cause: Secondary | ICD-10-CM | POA: Diagnosis not present

## 2018-11-02 DIAGNOSIS — R51 Headache: Secondary | ICD-10-CM | POA: Diagnosis not present

## 2018-11-02 DIAGNOSIS — N3000 Acute cystitis without hematuria: Secondary | ICD-10-CM | POA: Diagnosis not present

## 2018-11-02 DIAGNOSIS — Y999 Unspecified external cause status: Secondary | ICD-10-CM | POA: Insufficient documentation

## 2018-11-02 DIAGNOSIS — Y939 Activity, unspecified: Secondary | ICD-10-CM | POA: Diagnosis not present

## 2018-11-02 DIAGNOSIS — W19XXXA Unspecified fall, initial encounter: Secondary | ICD-10-CM | POA: Diagnosis not present

## 2018-11-02 DIAGNOSIS — I10 Essential (primary) hypertension: Secondary | ICD-10-CM | POA: Insufficient documentation

## 2018-11-02 DIAGNOSIS — S0993XA Unspecified injury of face, initial encounter: Secondary | ICD-10-CM | POA: Diagnosis not present

## 2018-11-02 DIAGNOSIS — S0990XA Unspecified injury of head, initial encounter: Secondary | ICD-10-CM | POA: Diagnosis not present

## 2018-11-02 LAB — URINALYSIS, ROUTINE W REFLEX MICROSCOPIC
Bilirubin Urine: NEGATIVE
Glucose, UA: NEGATIVE mg/dL
Hgb urine dipstick: NEGATIVE
KETONES UR: NEGATIVE mg/dL
Nitrite: NEGATIVE
PH: 7 (ref 5.0–8.0)
Protein, ur: NEGATIVE mg/dL
SPECIFIC GRAVITY, URINE: 1.015 (ref 1.005–1.030)

## 2018-11-02 LAB — BASIC METABOLIC PANEL
ANION GAP: 8 (ref 5–15)
BUN: 13 mg/dL (ref 6–20)
CALCIUM: 9.6 mg/dL (ref 8.9–10.3)
CO2: 27 mmol/L (ref 22–32)
Chloride: 104 mmol/L (ref 98–111)
Creatinine, Ser: 0.88 mg/dL (ref 0.44–1.00)
GLUCOSE: 80 mg/dL (ref 70–99)
Potassium: 3.5 mmol/L (ref 3.5–5.1)
Sodium: 139 mmol/L (ref 135–145)

## 2018-11-02 LAB — CBC WITH DIFFERENTIAL/PLATELET
Abs Immature Granulocytes: 0.02 10*3/uL (ref 0.00–0.07)
BASOS PCT: 1 %
Basophils Absolute: 0 10*3/uL (ref 0.0–0.1)
EOS ABS: 0.2 10*3/uL (ref 0.0–0.5)
Eosinophils Relative: 3 %
HCT: 42.6 % (ref 36.0–46.0)
Hemoglobin: 13.1 g/dL (ref 12.0–15.0)
IMMATURE GRANULOCYTES: 0 %
Lymphocytes Relative: 21 %
Lymphs Abs: 1.4 10*3/uL (ref 0.7–4.0)
MCH: 28.5 pg (ref 26.0–34.0)
MCHC: 30.8 g/dL (ref 30.0–36.0)
MCV: 92.6 fL (ref 80.0–100.0)
MONOS PCT: 14 %
Monocytes Absolute: 0.9 10*3/uL (ref 0.1–1.0)
NEUTROS PCT: 61 %
NRBC: 0 % (ref 0.0–0.2)
Neutro Abs: 4.2 10*3/uL (ref 1.7–7.7)
PLATELETS: 193 10*3/uL (ref 150–400)
RBC: 4.6 MIL/uL (ref 3.87–5.11)
RDW: 12.9 % (ref 11.5–15.5)
WBC: 6.8 10*3/uL (ref 4.0–10.5)

## 2018-11-02 LAB — CBG MONITORING, ED: GLUCOSE-CAPILLARY: 82 mg/dL (ref 70–99)

## 2018-11-02 MED ORDER — FOSFOMYCIN TROMETHAMINE 3 G PO PACK
3.0000 g | PACK | Freq: Once | ORAL | Status: AC
  Administered 2018-11-02: 3 g via ORAL
  Filled 2018-11-02: qty 3

## 2018-11-02 MED ORDER — SODIUM CHLORIDE 0.9 % IV BOLUS
1000.0000 mL | Freq: Once | INTRAVENOUS | Status: AC
  Administered 2018-11-02: 1000 mL via INTRAVENOUS

## 2018-11-02 NOTE — ED Provider Notes (Signed)
[facility] EMERGENCY DEPARTMENT Provider Note   CSN: [id number] Arrival date & time: 11/02/18  1605     History   Chief Complaint Chief Complaint  Patient presents with  . Fall    HPI Stephanie Zhang is a 57 y.o. female with a history of hypertension, hypoglycemia, MS currently residing at [facility] presenting for fall.  History obtained by patient and family who is at bedside including 2 sisters.  Patient reports that yesterday she was walking with her walker and felt dizzy and lightheaded causing her to slip and fall onto her knees.  She denies any head trauma.  Today she reportedly was sitting watching TV when she saw a coloring book and went to reach for this, signing off the chair and falling onto her right side with noted head trauma.  She denied any loss of consciousness.  She reports since that time she has had headache and some nausea.  She also reports right-sided facial pain over her cheek & midline neck pain she denies any preceding visual changes, facial droop, vertigo, difficulty with speech, focal weakness, numbness/tingling of the face or extremities.  However sister states that the nursing facility did call her and states that she was complaining of a headache and diplopia earlier in the morning.  Patient states she does not remember this.  She is alert and oriented x4. She is not on blood thinners.   I contacted [place] facility to clarify these episodes of diplopia this morning which they state is new for her and ha that this sisters disclosed. No other neurologic symptoms. The Lelan Pons, the nurse - slid out of her chair like patient described. No LOC. She received 1000mg  of tylenol prior to arrival. She did complain of a HA after fall. No ne.  Brought in by family. They are unsure of last known normal.   HPI  Past Medical History:  Diagnosis Date  . Anxiety   . Blood transfusion    "when I was born"  . Breast cancer  ([place]) 10/2001   Stage II (right side)  . Chronic back pain   . Confusion   . Depression   . Gastroparesis   . GERD (gastroesophageal reflux disease)   . Headache(784.0)    "lots"  . Hypoglycemia   . MS (multiple sclerosis) ([place]) 1987  . Multiple sclerosis   . Osteoporosis   . Short-term memory loss     Patient Active Problem List   Diagnosis Date Noted  . Essential hypertension 07/22/2018  . Special screening for malignant neoplasms, colon 04/21/2013  . Gastroparesis 04/18/2012  . Weight loss, unintentional 04/16/2012  . Malnutrition ([place]) 04/16/2012  . MS (multiple sclerosis) ([place])   . Cancer ([place])   . Hypoglycemia   . MULTIPLE SCLEROSIS, RELAPSING/REMITTING 02/16/2010  . WEAKNESS 02/16/2010  . GAIT ATAXIA 02/16/2010  . RECTAL BLEEDING, HX OF 02/13/2010  . BREAST CANCER, HX OF 08/06/2007    Past Surgical History:  Procedure Laterality Date  . BREAST BIOPSY  2002   right  . BREAST LUMPECTOMY  2002   right  . COLONOSCOPY N/A 04/21/2013   Procedure: COLONOSCOPY;  Surgeon: Ladene Artist, MD;  Location: [facility];  Service: Endoscopy;  Laterality: N/A;     OB History    Gravida  1   Para  0   Term      Preterm      AB  1   Living  0  SAB      TAB  1   Ectopic      Multiple      Live Births               Home Medications    Prior to Admission medications   Medication Sig Start Date End Date Taking? Authorizing Provider  acetaminophen (TYLENOL) 500 MG tablet Take 1,000 mg by mouth every 6 (six) hours as needed for mild pain.    [provider]  buPROPion (WELLBUTRIN) 75 MG tablet Take 2 tablets (150 mg total) by mouth daily. 11/09/16   Marletta Lor, MD  Calcium Carbonate-Vitamin D 600-400 MG-UNIT tablet Take 1 tablet by mouth daily. 06/06/17   Marletta Lor, MD  Cholecalciferol (VITAMIN D) 2000 units tablet GIVE 1 TAB BY MOUTH EVERY DAY 11/14/17   Pieter Partridge, DO  ciprofloxacin (CIPRO) 500 MG tablet Take 1 tablet  (500 mg total) by mouth 2 (two) times daily. 10/01/18   Nafziger, Tommi Rumps, NP  docusate sodium (COLACE) 100 MG capsule Take 1 capsule (100 mg total) by mouth 2 (two) times daily. 01/03/17   Marletta Lor, MD  famotidine (PEPCID) 20 MG tablet Take 1 tablet (20 mg total) by mouth at bedtime. 10/09/17   Marletta Lor, MD  fluticasone (FLONASE) 50 MCG/ACT nasal spray Place 2 sprays into both nostrils daily. 04/11/18   Nafziger, Tommi Rumps, NP  guaiFENesin (ROBITUSSIN) 100 MG/5ML liquid Take 200 mg by mouth every 6 (six) hours as needed for cough.    [provider]  hydrocortisone cream 1 % Apply twice daily to affected areas 01/20/18   [name], MD  lisinopril (PRINIVIL,ZESTRIL) 5 MG tablet Take 1 tablet (5 mg total) by mouth daily. 01/20/18   [name], MD  loperamide (IMODIUM) 2 MG capsule Take 2 mg by mouth every 4 (four) hours as needed for diarrhea or loose stools.     [provider]  loratadine (CLARITIN) 10 MG tablet Take 1 tablet (10 mg total) by mouth daily. 04/11/18   Nafziger, Tommi Rumps, NP  ondansetron (ZOFRAN) 4 MG tablet Take 1 tablet (4 mg total) by mouth every 8 (eight) hours as needed for nausea or vomiting. 09/23/18   Nafziger, Tommi Rumps, NP  polyethylene glycol (MIRALAX / GLYCOLAX) packet Take 17 g by mouth daily. 07/17/17   Marletta Lor, MD  promethazine (PHENERGAN) 25 MG tablet Take 25 mg by mouth every 6 (six) hours as needed for nausea or vomiting.    [provider]  senna (SENOKOT) 8.6 MG tablet Take by mouth.    [provider]  Skin Protectants, Misc. (EUCERIN) cream Apply 1 application topically 2 (two) times daily as needed for dry skin. 04/07/18   Marletta Lor, MD    Family History Family History  Problem Relation Age of Onset  . Diabetes Mother   . Cancer Mother        cervical cancer  . Heart disease Mother   . Diabetes Father     Social History Social History   Tobacco Use  . Smoking status: Former Smoker     Last attempt to quit: 05/11/2011    Years since quitting: 7.4  . Smokeless tobacco: Never Used  Substance Use Topics  . Alcohol use: No    Comment: "last alcohol 12/04/11"  . Drug use: No    Types: LSD, Marijuana, "Crack" cocaine    Comment: "last drug use ~ 1980"     Allergies  Amantadines; Sulfa antibiotics; and Sulfacetamide sodium   Review of Systems Review of Systems  All other systems reviewed and are negative.    Physical Exam Updated Vital Signs BP 111/76   Pulse 73   Temp 98.1 F (36.7 C)   Resp 16   Ht 5\' 4"  (1.626 m)   Wt 59 kg   LMP 08/09/2008   SpO2 100%   BMI 22.31 kg/m   Physical Exam  Constitutional: She appears well-developed and well-nourished.  HENT:  Head: Normocephalic. Head is without raccoon's eyes, without Battle's sign and without abrasion.    Right Ear: Tympanic membrane and external ear normal. No hemotympanum.  Left Ear: Tympanic membrane and external ear normal. No hemotympanum.  Nose: Nose normal. No nasal septal hematoma.  Mouth/Throat: Uvula is midline, oropharynx is clear and moist and mucous membranes are normal. No tonsillar exudate.  No raccoon eyes or battle signs.  No hemotympanum.  No CSF otorrhea.  No palpable open or depressed skull fracture.  Patient does have tenderness palpation over the right cheek.  No open wounds or abrasions noted.  Eyes: Pupils are equal, round, and reactive to light. Right eye exhibits no discharge. Left eye exhibits no discharge. No scleral icterus.  Neck: Trachea normal. Neck supple. No spinous process tenderness and no muscular tenderness present. No neck rigidity. Normal range of motion present.  Cardiovascular: Normal rate, regular rhythm and intact distal pulses.  No murmur heard. Pulses:      Radial pulses are 2+ on the right side, and 2+ on the left side.       Dorsalis pedis pulses are 2+ on the right side, and 2+ on the left side.       Posterior tibial pulses are 2+ on the right  side, and 2+ on the left side.  No lower extremity swelling or edema. Calves symmetric in size bilaterally.  Pulmonary/Chest: Effort normal and breath sounds normal. She exhibits no tenderness.  Abdominal: Soft. Bowel sounds are normal. She exhibits no distension. There is no tenderness. There is no rigidity, no rebound, no guarding and no CVA tenderness.  Musculoskeletal: She exhibits no edema.  No cervical, thoracic or lumbar spinous tenderness palpation or step-offs.  Passive range of motion of upper and lower extremities without pain or difficulty.  Negative logroll test bilaterally.  No sacral crepitus.  Upper and lower extremity compartments soft.  She is neurovascular intact upper and lower extremities.  Lymphadenopathy:    She has no cervical adenopathy.  Neurological: She is alert.  Speech clear. Follows commands. No facial droop. PERRLA. EOMI. Normal peripheral fields. CN III-XII intact.  Grossly moves all extremities 4 without ataxia. Coordination intact. Able and appropriate strength for age to upper and lower extremities bilaterally including grip strength & plantar flexion/dorsiflexion. Sensation to light touch intact bilaterally for upper and lower. Normal finger to nose. No pronator drift. Able transfer from wheelchair to bed.   Skin: Skin is warm and dry. No rash noted. She is not diaphoretic.  Psychiatric: She has a normal mood and affect.  Nursing note and vitals reviewed.    ED Treatments / Results  Labs (all labs ordered are listed, but only abnormal results are displayed) Labs Reviewed  URINALYSIS, ROUTINE W REFLEX MICROSCOPIC - Abnormal; Notable for the following components:      Result Value   APPearance CLOUDY (*)    Leukocytes, UA MODERATE (*)    Bacteria, UA RARE (*)    All other components within normal limits  URINE CULTURE  BASIC METABOLIC PANEL  CBC WITH DIFFERENTIAL/PLATELET  CBG MONITORING, ED    EKG None  Radiology Ct Head Wo Contrast  Result  Date: 11/02/2018 CLINICAL DATA:  Fall from chair, struck RIGHT head. No loss of consciousness. Pain and headache. History of multiple sclerosis, breast cancer. EXAM: CT HEAD WITHOUT CONTRAST CT MAXILLOFACIAL WITHOUT CONTRAST CT CERVICAL SPINE WITHOUT CONTRAST TECHNIQUE: Multidetector CT imaging of the head, cervical spine, and maxillofacial structures were performed using the standard protocol without intravenous contrast. Multiplanar CT image reconstructions of the cervical spine and maxillofacial structures were also generated. COMPARISON:  MRI head October 06, 2018 and CT cervical spine May 12, 2017. FINDINGS: CT HEAD FINDINGS BRAIN: No intraparenchymal hemorrhage, mass effect, midline shift or acute large vascular territory infarcts. Mild parenchymal brain volume loss. Patchy to confluent supratentorial white matter hypodensities similar to prior MRI given differences in imaging technique. No abnormal extra-axial fluid collections. Basal cisterns are patent. VASCULAR: Trace calcific atherosclerosis carotid siphon. SKULL/SOFT TISSUES: No skull fracture. No significant soft tissue swelling. OTHER: None. CT MAXILLOFACIAL FINDINGS OSSEOUS: No acute facial fracture. The mandible is intact, the condyles are located. No destructive bony lesions. LEFT mandible ramus bone island, potential unremarkable erupted small molar. Moderate RIGHT temporomandibular osteoarthrosis. ORBITS: Ocular globes and orbital contents are normal. SINUSES: Small mucosal retention cysts. No paranasal sinus air-fluid levels. Mastoid air cells are well aerated. Intact nasal septum is deviated to the LEFT with moderate bony spur. RIGHT concha bullosa. SOFT TISSUES: RIGHT face soft tissue swelling and subcutaneous fat stranding compatible with contusion. No subcutaneous gas or radiopaque foreign bodies. CT CERVICAL SPINE FINDINGS ALIGNMENT: Maintenance of cervical lordosis. Minimal grade 1 C4-5 anterolisthesis. SKULL BASE AND VERTEBRAE: Cervical  vertebral bodies and posterior elements are intact. Moderate C5-6 with bulky ventral and C6-7 disc height loss endplate spurring. C2-3 through C5-6 severe facet arthropathy. Atlantodental interval maintained with severe arthrosis and contiguous longus coli insertional enthesopathy. SOFT TISSUES AND SPINAL CANAL: Nonacute. Mild calcific atherosclerosis LEFT carotid bifurcation. DISC LEVELS: No significant osseous canal stenosis or neural foraminal narrowing. UPPER CHEST: Lung apices are clear. OTHER: None. IMPRESSION: CT HEAD: 1. No acute intracranial process. 2. Advanced white matter changes consistent with history of demyelination, relatively stable given different imaging technique. 3. Mild parenchymal brain volume loss. CT MAXILLOFACIAL: 1. Mild RIGHT facial soft tissue swelling/contusion. No facial fracture. CT CERVICAL SPINE: 1. No fracture or malalignment. 2. Degenerative change of the cervical spine including multilevel severe facet arthropathy. Electronically Signed   By: Elon Alas M.D.   On: 11/02/2018 17:55   Ct Cervical Spine Wo Contrast  Result Date: 11/02/2018 CLINICAL DATA:  Fall from chair, struck RIGHT head. No loss of consciousness. Pain and headache. History of multiple sclerosis, breast cancer. EXAM: CT HEAD WITHOUT CONTRAST CT MAXILLOFACIAL WITHOUT CONTRAST CT CERVICAL SPINE WITHOUT CONTRAST TECHNIQUE: Multidetector CT imaging of the head, cervical spine, and maxillofacial structures were performed using the standard protocol without intravenous contrast. Multiplanar CT image reconstructions of the cervical spine and maxillofacial structures were also generated. COMPARISON:  MRI head October 06, 2018 and CT cervical spine May 12, 2017. FINDINGS: CT HEAD FINDINGS BRAIN: No intraparenchymal hemorrhage, mass effect, midline shift or acute large vascular territory infarcts. Mild parenchymal brain volume loss. Patchy to confluent supratentorial white matter hypodensities similar to prior  MRI given differences in imaging technique. No abnormal extra-axial fluid collections. Basal cisterns are patent. VASCULAR: Trace calcific atherosclerosis carotid siphon. SKULL/SOFT TISSUES: No skull fracture. No significant soft tissue swelling.  OTHER: None. CT MAXILLOFACIAL FINDINGS OSSEOUS: No acute facial fracture. The mandible is intact, the condyles are located. No destructive bony lesions. LEFT mandible ramus bone island, potential unremarkable erupted small molar. Moderate RIGHT temporomandibular osteoarthrosis. ORBITS: Ocular globes and orbital contents are normal. SINUSES: Small mucosal retention cysts. No paranasal sinus air-fluid levels. Mastoid air cells are well aerated. Intact nasal septum is deviated to the LEFT with moderate bony spur. RIGHT concha bullosa. SOFT TISSUES: RIGHT face soft tissue swelling and subcutaneous fat stranding compatible with contusion. No subcutaneous gas or radiopaque foreign bodies. CT CERVICAL SPINE FINDINGS ALIGNMENT: Maintenance of cervical lordosis. Minimal grade 1 C4-5 anterolisthesis. SKULL BASE AND VERTEBRAE: Cervical vertebral bodies and posterior elements are intact. Moderate C5-6 with bulky ventral and C6-7 disc height loss endplate spurring. C2-3 through C5-6 severe facet arthropathy. Atlantodental interval maintained with severe arthrosis and contiguous longus coli insertional enthesopathy. SOFT TISSUES AND SPINAL CANAL: Nonacute. Mild calcific atherosclerosis LEFT carotid bifurcation. DISC LEVELS: No significant osseous canal stenosis or neural foraminal narrowing. UPPER CHEST: Lung apices are clear. OTHER: None. IMPRESSION: CT HEAD: 1. No acute intracranial process. 2. Advanced white matter changes consistent with history of demyelination, relatively stable given different imaging technique. 3. Mild parenchymal brain volume loss. CT MAXILLOFACIAL: 1. Mild RIGHT facial soft tissue swelling/contusion. No facial fracture. CT CERVICAL SPINE: 1. No fracture or  malalignment. 2. Degenerative change of the cervical spine including multilevel severe facet arthropathy. Electronically Signed   By: Elon Alas M.D.   On: 11/02/2018 17:55   Ct Maxillofacial Wo Cm  Result Date: 11/02/2018 CLINICAL DATA:  Fall from chair, struck RIGHT head. No loss of consciousness. Pain and headache. History of multiple sclerosis, breast cancer. EXAM: CT HEAD WITHOUT CONTRAST CT MAXILLOFACIAL WITHOUT CONTRAST CT CERVICAL SPINE WITHOUT CONTRAST TECHNIQUE: Multidetector CT imaging of the head, cervical spine, and maxillofacial structures were performed using the standard protocol without intravenous contrast. Multiplanar CT image reconstructions of the cervical spine and maxillofacial structures were also generated. COMPARISON:  MRI head October 06, 2018 and CT cervical spine May 12, 2017. FINDINGS: CT HEAD FINDINGS BRAIN: No intraparenchymal hemorrhage, mass effect, midline shift or acute large vascular territory infarcts. Mild parenchymal brain volume loss. Patchy to confluent supratentorial white matter hypodensities similar to prior MRI given differences in imaging technique. No abnormal extra-axial fluid collections. Basal cisterns are patent. VASCULAR: Trace calcific atherosclerosis carotid siphon. SKULL/SOFT TISSUES: No skull fracture. No significant soft tissue swelling. OTHER: None. CT MAXILLOFACIAL FINDINGS OSSEOUS: No acute facial fracture. The mandible is intact, the condyles are located. No destructive bony lesions. LEFT mandible ramus bone island, potential unremarkable erupted small molar. Moderate RIGHT temporomandibular osteoarthrosis. ORBITS: Ocular globes and orbital contents are normal. SINUSES: Small mucosal retention cysts. No paranasal sinus air-fluid levels. Mastoid air cells are well aerated. Intact nasal septum is deviated to the LEFT with moderate bony spur. RIGHT concha bullosa. SOFT TISSUES: RIGHT face soft tissue swelling and subcutaneous fat stranding  compatible with contusion. No subcutaneous gas or radiopaque foreign bodies. CT CERVICAL SPINE FINDINGS ALIGNMENT: Maintenance of cervical lordosis. Minimal grade 1 C4-5 anterolisthesis. SKULL BASE AND VERTEBRAE: Cervical vertebral bodies and posterior elements are intact. Moderate C5-6 with bulky ventral and C6-7 disc height loss endplate spurring. C2-3 through C5-6 severe facet arthropathy. Atlantodental interval maintained with severe arthrosis and contiguous longus coli insertional enthesopathy. SOFT TISSUES AND SPINAL CANAL: Nonacute. Mild calcific atherosclerosis LEFT carotid bifurcation. DISC LEVELS: No significant osseous canal stenosis or neural foraminal narrowing. UPPER CHEST: Lung apices are  clear. OTHER: None. IMPRESSION: CT HEAD: 1. No acute intracranial process. 2. Advanced white matter changes consistent with history of demyelination, relatively stable given different imaging technique. 3. Mild parenchymal brain volume loss. CT MAXILLOFACIAL: 1. Mild RIGHT facial soft tissue swelling/contusion. No facial fracture. CT CERVICAL SPINE: 1. No fracture or malalignment. 2. Degenerative change of the cervical spine including multilevel severe facet arthropathy. Electronically Signed   By: Elon Alas M.D.   On: 11/02/2018 17:55    Procedures Procedures (including critical care time)  Medications Ordered in ED Medications  sodium chloride 0.9 % bolus 1,000 mL (1,000 mLs Intravenous New Bag/Given 11/02/18 1831)  fosfomycin (MONUROL) packet 3 g (has no administration in time range)     Initial Impression / Assessment and Plan / ED Course  I have reviewed the triage vital signs and the nursing notes.  Pertinent labs & imaging results that were available during my care of the patient were reviewed by me and considered in my medical decision making (see chart for details).     57 y.o. female with a history of hypertension, hypoglycemia, MS currently residing at [facility] presenting for fall.  History provided by patient, family and nurse at patient's nursing facility.  Patient reportedly had an episode of dizziness and lightheadedness yesterday which caused her to fall to her knees.  No head trauma at that time.  Patient reportedly told staff earlier today that she had some diplopia.  No other neurologic symptoms.  Patient does not remember this.  She reportedly was reaching for a coloring book at the nursing facility when she slid of the chair and fell and hit her head.  She is now complaining of a headache as well as right-sided facial pain.  She was given Tylenol prior to arrival.  On arrival vital signs are reassuring.  There is no evidence of basilar skull fracture.  She is on any blood thinners.  CT of head, neck and maxillofacial ordered to evaluate.  No thoracic or lumbar spinous tenderness.  No chest deformity.  No crepitus or tenderness of chest wall.  Lungs clear to auscultation bilaterally.  Abdomen is soft and nontender.  Sacral crepitus.  Negative logroll test.  Passive range of motion of upper and lower extremities without pain or difficulty.  No open wounds noted.  She is without any focal deficits on exam.  She denies any visual changes at this time.  No visual field cuts or diplopia reported.  Patient evaluated with blood work and CT scans.  CT of the head without any acute intracranial process.  No skull fracture or ICH.  CT maxillofacial without any evidence of facial fracture.  CT cervical spine without any fracture or malalignment.  Blood work reviewed and reassuring.  UA with evidence UTI.  On symptom recheck patient does report some dysuria.  Urine culture was sent.  Patient treated with fosfomycin x1.  She was given fluid bolus. After discussion with my attending, no further workup warranted at this time. Will d/c back to nursing facility. Patient and patients family in agreement with plan and state understanding. Return precautions discussed.  Time was given for all questions to be answered. The patient verbalized understanding and agreement with plan. The patient appears safe for discharge home.  Final Clinical Impressions(s) / ED Diagnoses   Final diagnoses:  Fall, initial encounter  Injury of head, initial encounter  Cystitis    ED Discharge Orders    None  Jillyn Ledger, PA-C 11/02/18 1912    Malvin Johns, MD 11/02/18 [phone]

## 2018-11-02 NOTE — ED Triage Notes (Signed)
Pt states that she was watching TV and slide out of her chair. Pt reports she was leaning over to look at something and fell hitting her head on the right side. Denies LOC, denies blood thinners. Reports her neck has been hurting prior to her fall.

## 2018-11-02 NOTE — Discharge Instructions (Addendum)
You were treated for a UTI with a medication in the department. If additional medication is needed you will be called after the urine culture results.  If you develop severe headache, visual changes, blurring of your vision, double vision, difficulty with speech, facial droop, numbness/tingling of your face or extremities, weakness of any of your extremities, difficulty with walking, dizziness or fall again please return to the emergency department for evaluation. If you develop worsening or new concerning symptoms you can return to the emergency department for re-evaluation.  Please follow up with your pcp in the next 1 week.

## 2018-11-02 NOTE — ED Notes (Signed)
Family at bedside reports that patient also feel yesterday "her hands slipped off of her walker and she fell to her knees". Family also reporting that she has been c/o headaches and double vision.

## 2018-11-02 NOTE — ED Notes (Signed)
Patient verbalizes understanding of discharge instructions. Opportunity for questioning and answers were provided. Armband removed by staff, pt discharged from ED via wheelchair w/ family   

## 2018-11-02 NOTE — ED Notes (Signed)
ED Provider at bedside. 

## 2018-11-04 ENCOUNTER — Telehealth: Payer: Self-pay

## 2018-11-04 NOTE — Telephone Encounter (Signed)
Pt fall report was received today ( 11/04/2018) received from [facility], placed on Dr Volanda Napoleon binder for signiture

## 2018-11-04 NOTE — Telephone Encounter (Signed)
-----   Message from Pieter Partridge, DO sent at 10/16/2018  9:01 AM EST ----- Vitamin D level a little low.  I would like her to increase D3 from 2000 IU daily to 3000 IU daily.

## 2018-11-04 NOTE — Telephone Encounter (Signed)
Called Pt [facility] for rtrn call

## 2018-11-05 LAB — URINE CULTURE: Culture: >=100000 — AB

## 2018-11-06 ENCOUNTER — Telehealth: Payer: Self-pay | Admitting: *Deleted

## 2018-11-06 NOTE — Telephone Encounter (Signed)
Post ED Visit - Positive Culture Follow-up  Culture report reviewed by antimicrobial stewardship pharmacist:  []  Elenor Quinones, Pharm.D. []  Heide Guile, Pharm.D., BCPS AQ-ID [x]  Parks Neptune, Pharm.D., BCPS []  Alycia Rossetti, Pharm.D., BCPS []  [place], Pharm.D., BCPS, AAHIVP []  Legrand Como, Pharm.D., BCPS, AAHIVP []  Salome Arnt, PharmD, BCPS []  Johnnette Gourd, PharmD, BCPS []  Hughes Better, PharmD, BCPS []  Leeroy Cha, PharmD  Positive urine culture Treated with Fosfomycin, organism sensitive to the same and no further patient follow-up is required at this time.  Harlon Flor [facility] 11/06/2018, 9:47 AM

## 2018-11-11 ENCOUNTER — Ambulatory Visit (INDEPENDENT_AMBULATORY_CARE_PROVIDER_SITE_OTHER): Payer: Medicare Other | Admitting: Family Medicine

## 2018-11-11 ENCOUNTER — Encounter: Payer: Self-pay | Admitting: Family Medicine

## 2018-11-11 VITALS — BP 116/70 | HR 96 | Temp 98.1°F | Wt 141.0 lb

## 2018-11-11 DIAGNOSIS — Z09 Encounter for follow-up examination after completed treatment for conditions other than malignant neoplasm: Secondary | ICD-10-CM | POA: Diagnosis not present

## 2018-11-11 DIAGNOSIS — G35 Multiple sclerosis: Secondary | ICD-10-CM | POA: Diagnosis not present

## 2018-11-11 DIAGNOSIS — I10 Essential (primary) hypertension: Secondary | ICD-10-CM | POA: Diagnosis not present

## 2018-11-11 NOTE — Progress Notes (Signed)
Subjective:    Patient ID: Stephanie Zhang, female    DOB: September 11, 1961, 57 y.o.   MRN: [id number]  No chief complaint on file. Pt accompanied by her friend.  HPI Patient was seen today for ED f/u.  Pt was seen in ED on 11/25 s/p fall at [facility].  Pt was dx'd with a UTI.  She has completed a course of Cipro.  States she is feeling better.  Pt endorses trying to drink more water, but notes she often holds her urine.  MS-stable.  Using wheelchair this visit.  Pt notes her appetite has picked up.  HTN: bp controlled.  Recent readings 110/82 and 120/70.  Past Medical History:  Diagnosis Date  . Anxiety   . Blood transfusion    "when I was born"  . Breast cancer ([place]) 10/2001   Stage II (right side)  . Chronic back pain   . Confusion   . Depression   . Gastroparesis   . GERD (gastroesophageal reflux disease)   . Headache(784.0)    "lots"  . Hypoglycemia   . MS (multiple sclerosis) ([place]) 1987  . Multiple sclerosis   . Osteoporosis   . Short-term memory loss     Allergies  Allergen Reactions  . Amantadines   . Sulfa Antibiotics Itching and Rash  . Sulfacetamide Sodium Itching and Rash    ROS General: Denies fever, chills, night sweats, changes in weight, changes in appetite HEENT: Denies headaches, ear pain, changes in vision, rhinorrhea, sore throat CV: Denies CP, palpitations, SOB, orthopnea Pulm: Denies SOB, cough, wheezing GI: Denies abdominal pain, nausea, vomiting, diarrhea, constipation GU: Denies dysuria, hematuria, frequency, vaginal discharge Msk: Denies muscle cramps, joint pains  +h/o MS Neuro: Denies weakness, numbness, tingling Skin: Denies rashes, bruising Psych: Denies depression, anxiety, hallucinations      Objective:    Blood pressure 116/70, pulse 96, temperature 98.1 F (36.7 C), temperature source Oral, weight 141 lb (64 kg), last menstrual period 08/09/2008, SpO2 98 %.   Gen. Pleasant, well-nourished, in no distress, normal  affect   HEENT: [place]/AT, face symmetric, no scleral icterus, PERRLA, nares patent without drainage Lungs: no accessory muscle use, CTAB, no wheezes or rales Cardiovascular: RRR, no m/r/g, no peripheral edema Abdomen: BS present, soft, NT/ND Neuro:  A&Ox3, CN II-XII intact, sitting in wheelchair  Wt Readings from Last 3 Encounters:  11/02/18 130 lb (59 kg)  10/14/18 137 lb (62.1 kg)  10/03/18 136 lb (61.7 kg)    Lab Results  Component Value Date   WBC 6.8 11/02/2018   HGB 13.1 11/02/2018   HCT 42.6 11/02/2018   PLT 193 11/02/2018   GLUCOSE 80 11/02/2018   CHOL 153 12/14/2016   TRIG 92.0 12/14/2016   HDL 62.80 12/14/2016   LDLCALC 72 12/14/2016   ALT 12 10/17/2017   AST 16 10/17/2017   NA 139 11/02/2018   K 3.5 11/02/2018   CL 104 11/02/2018   CREATININE 0.88 11/02/2018   BUN 13 11/02/2018   CO2 27 11/02/2018   TSH 2.25 04/12/2017    Assessment/Plan:  Follow-up exam -ED f/u for UTI -improved since completing course of Cipro. -encouraged to increase po intake of water and not to hold urine.  MS (multiple sclerosis) (HCC) -stable  Essential hypertension -controlled -continue lisinopril 5 mg daily  F/u prn  Grier Mitts, MD

## 2018-11-11 NOTE — Patient Instructions (Signed)

## 2018-11-27 ENCOUNTER — Telehealth: Payer: Self-pay | Admitting: Neurology

## 2018-11-27 NOTE — Telephone Encounter (Signed)
Called and LMOVM for daughter to return my call

## 2018-11-27 NOTE — Telephone Encounter (Signed)
Yes, I would like to prepare a letter for her

## 2018-11-27 NOTE — Telephone Encounter (Signed)
Patient's daughter called regarding her getting summons for Jury Duty on 01/29/19. They are needing to pick up a letter from Dr. Tomi Likens stating she is not able to serve on [employer]. She will also bring the letter they received from the [place]. Thanks

## 2018-11-27 NOTE — Telephone Encounter (Signed)
Dr Tomi Likens, ok to prepare letter?

## 2018-11-28 NOTE — Telephone Encounter (Signed)
Called and LMOVM advising letter is ready for p/u

## 2018-12-11 ENCOUNTER — Encounter: Payer: Self-pay | Admitting: Family Medicine

## 2018-12-18 ENCOUNTER — Telehealth: Payer: Self-pay

## 2018-12-18 NOTE — Telephone Encounter (Signed)
Copied from [place] [id number]. Topic: General - Other >> Dec 16, 2018  4:09 PM Alanda Slim E wrote: Reason for CRM: [facility] called with a follow up for Pt/ please call >> Dec 18, 2018 10:18 AM Ivar Drape wrote: [facility] ph: [phone] Fax [phone] faxed a health form this morning on the patient and they want to confirm that the practice received it.

## 2018-12-24 ENCOUNTER — Telehealth: Payer: Self-pay

## 2018-12-24 NOTE — Telephone Encounter (Signed)
Spoke with the nurse at the facility states that pt feels better and does not need to be seen in the office.

## 2018-12-30 NOTE — Telephone Encounter (Signed)
Spoke with the nurse at the facility states that pt feels better and does not need to be seen in the office.

## 2018-12-31 ENCOUNTER — Telehealth: Payer: Self-pay

## 2018-12-31 NOTE — Telephone Encounter (Signed)
Pt. Phoned to set up subsequent AWV. Sister answered, and awv scheduled for 2/25 prior to OV with PCP.

## 2019-01-14 ENCOUNTER — Other Ambulatory Visit: Payer: Self-pay

## 2019-01-14 MED ORDER — VITAMIN D 50 MCG (2000 UT) PO TABS: ORAL_TABLET | ORAL | 6 refills | Status: DC

## 2019-01-19 ENCOUNTER — Encounter: Payer: Self-pay | Admitting: Family Medicine

## 2019-01-19 ENCOUNTER — Ambulatory Visit (INDEPENDENT_AMBULATORY_CARE_PROVIDER_SITE_OTHER): Payer: Medicare Other | Admitting: Family Medicine

## 2019-01-19 ENCOUNTER — Telehealth: Payer: Self-pay | Admitting: *Deleted

## 2019-01-19 VITALS — BP 116/72 | HR 103 | Temp 101.3°F | Resp 16

## 2019-01-19 DIAGNOSIS — R112 Nausea with vomiting, unspecified: Secondary | ICD-10-CM | POA: Diagnosis not present

## 2019-01-19 DIAGNOSIS — K529 Noninfective gastroenteritis and colitis, unspecified: Secondary | ICD-10-CM

## 2019-01-19 LAB — POCT INFLUENZA A/B
INFLUENZA A, POC: NEGATIVE
INFLUENZA B, POC: NEGATIVE

## 2019-01-19 MED ORDER — ONDANSETRON HCL 4 MG PO TABS: 4.0000 mg | ORAL_TABLET | Freq: Three times a day (TID) | ORAL | 0 refills | Status: AC | PRN

## 2019-01-19 NOTE — Telephone Encounter (Signed)
Spoke with Lelan Pons with [facility] regarding dr Volanda Napoleon recommendations for pt to be seen at our office today, Lelan Pons from [facility] states that she will call pt family and call our office back to schedule pt appointment

## 2019-01-19 NOTE — Telephone Encounter (Signed)
I called [facility] at the number below to follow up on this call and spoke with Estill Bamberg. She stated Stephanie Zhang spoke with the family and she is on her way here now (has appt with Dr Martinique at 4:30pm).

## 2019-01-19 NOTE — Telephone Encounter (Signed)
Copied from [place] [id number]. Topic: General - Other >> Jan 19, 2019 10:54 AM Yvette Rack wrote: Reason for CRM: Lelan Pons with [facility] stated patient is vomiting and having loose stool and she would like to speak with provider to advise. Lelan Pons stated she has administered PRN for the symptoms and she would just like a call back. Cb# [phone]

## 2019-01-19 NOTE — Telephone Encounter (Signed)
Spoke with Stephanie Zhang, pt is now having a fever of 101 Has not felt well over the weekend - vomiting and Diarrhea - trying to stay hydrated.  Pt has been given Zofran and Imodium PRN for sx's  Please advise if any further recommendations. Thanks.

## 2019-01-19 NOTE — Patient Instructions (Addendum)
  [facility] I have seen you today for an acute visit.  A few things to remember from today's visit:   Acute gastroenteritis  Nausea and vomiting in adult - Plan: ondansetron (ZOFRAN) 4 MG tablet, POC Influenza A/B   Symptoms are most likely viral and self-limited. Good hand hygiene is important, decrease risk of transmission. Small and frequent sips of clear fluids, Pedialyte or Gatorade age good options.  BRAT diet: bananas, rice, applesauce, and toast.  If persistent vomiting stop solids for 24 hours then resume liquid/soft diet and advance as tolerated.  Acetaminophen 500 mg qid as needed for fever.  Notified immediately or seek medical attention if you cannot keep fluids down, decreased urine output, severe abdominal pain, red bright blood per rectum, or changes in mental status.  Follow if not full recovery in 2-3 weeks or not better in a week.   In general please monitor for signs of worsening symptoms and seek immediate medical attention if any concerning.   I hope you get better soon!

## 2019-01-19 NOTE — Progress Notes (Signed)
ACUTE VISIT   HPI:  Chief Complaint  Patient presents with  . Fever    started today  . Diarrhea    started today  . Emesis    started last night    [name] is a 58 y.o. female with Hx of MS,gastroparesis, unstable gait is here today with caregiver complaining of above GI symptoms. Caregiver helps with history.  She was feeling well when she went to bed last night, early morning today she started with nausea, vomiting, diarrhea, and fever. An episode of vomiting and 3 episodes of diarrhea. One episode of stool incontinence.  She lives in assisted living, she is not sure about sick contact.  States that last night she was playing cards with "somebody", she thinks he may have been sick. No recent travel.  Last stool was this morning. Symptoms are aggravated by food intake.  She denies abdominal pain or urinary symptoms. She has taking Imodium.  She has not received antipyretic medication.  Symptoms have been stable.   Review of Systems  Constitutional: Positive for activity change, appetite change, chills, fatigue and fever.  HENT: Negative for congestion, mouth sores, rhinorrhea and sore throat.   Respiratory: Negative for cough, shortness of breath and wheezing.   Gastrointestinal: Positive for abdominal pain, diarrhea, nausea and vomiting. Negative for blood in stool.  Genitourinary: Negative for decreased urine volume, dysuria and hematuria.  Musculoskeletal: Positive for gait problem and myalgias.  Skin: Negative for rash.  Neurological: Negative for syncope and headaches.  Hematological: Negative for adenopathy. Does not bruise/bleed easily.  Psychiatric/Behavioral: Negative for confusion. The patient is nervous/anxious.       Current Outpatient Medications on File Prior to Visit  Medication Sig Dispense Refill  . acetaminophen (TYLENOL) 500 MG tablet Take 1,000 mg by mouth every 6 (six) hours as needed for mild pain.    [name] buPROPion  (WELLBUTRIN) 75 MG tablet Take 2 tablets (150 mg total) by mouth daily. 60 tablet 5  . Calcium Carbonate-Vitamin D 600-400 MG-UNIT tablet Take 1 tablet by mouth daily. 30 tablet 5  . Cholecalciferol (VITAMIN D) 50 MCG (2000 UT) tablet Take 1 TAB BY MOUTH EVERY DAY 30 tablet 6  . ciprofloxacin (CIPRO) 500 MG tablet Take 1 tablet (500 mg total) by mouth 2 (two) times daily. 6 tablet 0  . docusate sodium (COLACE) 100 MG capsule Take 1 capsule (100 mg total) by mouth 2 (two) times daily. 180 capsule 1  . famotidine (PEPCID) 20 MG tablet Take 1 tablet (20 mg total) by mouth at bedtime. 30 tablet 11  . fluticasone (FLONASE) 50 MCG/ACT nasal spray Place 2 sprays into both nostrils daily. 16 g 2  . guaiFENesin (ROBITUSSIN) 100 MG/5ML liquid Take 200 mg by mouth every 6 (six) hours as needed for cough.    . hydrocortisone cream 1 % Apply twice daily to affected areas 45 g 0  . lisinopril (PRINIVIL,ZESTRIL) 5 MG tablet Take 1 tablet (5 mg total) by mouth daily. 30 tablet 3  . loperamide (IMODIUM) 2 MG capsule Take 2 mg by mouth every 4 (four) hours as needed for diarrhea or loose stools.     [name] loratadine (CLARITIN) 10 MG tablet Take 1 tablet (10 mg total) by mouth daily. 30 tablet 2  . polyethylene glycol (MIRALAX / GLYCOLAX) packet Take 17 g by mouth daily. 100 each 11  . promethazine (PHENERGAN) 25 MG tablet Take 25 mg by mouth every 6 (six) hours as needed  for nausea or vomiting.    . senna (SENOKOT) 8.6 MG tablet Take by mouth.    . Skin Protectants, Misc. (EUCERIN) cream Apply 1 application topically 2 (two) times daily as needed for dry skin. 454 g 0   No current facility-administered medications on file prior to visit.      Past Medical History:  Diagnosis Date  . Anxiety   . Blood transfusion    "when I was born"  . Breast cancer ([place]) 10/2001   Stage II (right side)  . Chronic back pain   . Confusion   . Depression   . Gastroparesis   . GERD (gastroesophageal reflux disease)   .  Headache(784.0)    "lots"  . Hypoglycemia   . MS (multiple sclerosis) ([place]) 1987  . Multiple sclerosis   . Osteoporosis   . Short-term memory loss    Allergies  Allergen Reactions  . Amantadines   . Sulfa Antibiotics Itching and Rash  . Sulfacetamide Sodium Itching and Rash    Social History   Socioeconomic History  . Marital status: Widowed    Spouse name: Not on file  . Number of children: Not on file  . Years of education: Not on file  . Highest education level: Not on file  Occupational History  . Not on file  Social Needs  . Financial resource strain: Not on file  . Food insecurity:    Worry: Not on file    Inability: Not on file  . Transportation needs:    Medical: Not on file    Non-medical: Not on file  Tobacco Use  . Smoking status: Former Smoker    Last attempt to quit: 05/11/2011    Years since quitting: 7.6  . Smokeless tobacco: Never Used  Substance and Sexual Activity  . Alcohol use: No    Comment: "last alcohol 12/04/11"  . Drug use: No    Types: LSD, Marijuana, "Crack" cocaine    Comment: "last drug use ~ 1980"  . Sexual activity: Never  Lifestyle  . Physical activity:    Days per week: Not on file    Minutes per session: Not on file  . Stress: Not on file  Relationships  . Social connections:    Talks on phone: Not on file    Gets together: Not on file    Attends religious service: Not on file    Active member of club or organization: Not on file    Attends meetings of clubs or organizations: Not on file    Relationship status: Not on file  Other Topics Concern  . Not on file  Social History Narrative  . Not on file    Vitals:   01/19/19 1636  BP: 116/72  Pulse: (!) 103  Resp: 16  Temp: (!) 101.3 F (38.5 C)   There is no height or weight on file to calculate BMI.  Physical Exam  Nursing note and vitals reviewed. Constitutional: She is oriented to person, place, and time. She appears well-developed. She appears ill. No  distress.  HENT:  Head: Normocephalic and atraumatic.  Mouth/Throat: Oropharynx is clear and moist and mucous membranes are normal.  Eyes: Conjunctivae are normal. No scleral icterus.  Cardiovascular: Regular rhythm. Tachycardia present.  No murmur heard. Respiratory: Effort normal and breath sounds normal. No respiratory distress.  GI: Soft. She exhibits no distension and no mass. There is no abdominal tenderness.  Musculoskeletal:        General: No edema.  Lymphadenopathy:    She has no cervical adenopathy.  Neurological: She is alert and oriented to person, place, and time.  No focal deficit appreciated. In a wheel chair.  Skin: Skin is warm. No rash noted. No erythema.  Psychiatric: She has a normal mood and affect.  Fairly groomed, poor eye contact.    ASSESSMENT AND PLAN:  Ms. Khady was seen today for fever, diarrhea and emesis.  Diagnoses and all orders for this visit:  Acute gastroenteritis Rapid flu negative. Most likely viral etiology, so symptomatic treatment recommended for now.Other possible causes discussed but at this time I do not think further work-up is necessary. She is reporting normal urine output and oral mucosa is moist,so I do not think she needs to go to ER for IVF.  OTC Imodium could be used but I do not recommend unless 6 or more stools daily.  Some side effects discussed.  Oral hydration, Gatorade or Pedialyte are good options. Bland and light diet if tolerated. Clearly instructed about warning signs. F/U in a week if not any better , before if symptoms get worse.   Nausea and vomiting in adult Small and frequent sips of clear fluid recommended. Recommend Zofran, which she has taken before.  Clearly instructed about warning signs.  -     ondansetron (ZOFRAN) 4 MG tablet; Take 1 tablet (4 mg total) by mouth every 8 (eight) hours as needed for up to 5 days for nausea or vomiting. -     POC Influenza A/B     Return in about 1 week  (around 01/26/2019), or if symptoms worsen or fail to improve.       Betty G. Martinique, MD  [facility]. [place] office.

## 2019-01-27 ENCOUNTER — Telehealth: Payer: Self-pay

## 2019-01-27 NOTE — Telephone Encounter (Signed)
This problem was resolved

## 2019-02-02 NOTE — Progress Notes (Signed)
Subjective:   Stephanie Zhang is a 58 y.o. female who presents for Medicare Annual (Subsequent) preventive examination.  Review of Systems:  No ROS.  Medicare Wellness Visit. Additional risk factors are reflected in the social history.  Cardiac Risk Factors include: sedentary lifestyle Sleep patterns: falls asleep easily, gets up 3-4 times nightly to void and overall feels rested during the day. Goes to bed late usually.   Home Safety/Smoke Alarms: Feels safe in home. Smoke alarms in place.  Living environment; residence and Firearm Safety: 1-story house/ trailer, equipment: Walkers, Type: Conservation officer, nature. Unsteady gait, hx falls. Author reviewed importance of keeping environment safe, drinking enough water, eating throughout the day, and following up with eye Dr. For vision check so as to avoid future falls.  Seat Belt Safety/Bike Helmet: Wears seat belt.   Female:   Pap- 07/2011, overdue. Dr. Volanda Zhang made aware  Mammo- 10/2018, due 10/2019      Dexa scan- none of file, due. Order placed.      CCS- 04/2013, due 04/2023      Objective:     Vitals: BP 128/88 (BP Location: Left Arm, Patient Position: Sitting, Cuff Size: Normal)   Pulse 76   Temp 97.9 F (36.6 C) (Oral)   Ht 5\' 5"  (1.651 m)   Wt 143 lb (64.9 kg)   LMP 08/09/2008   SpO2 97%   BMI 23.80 kg/m   Body mass index is 23.8 kg/m.  Advanced Directives 02/03/2019 11/02/2018 06/22/2017 05/11/2017 02/26/2015 08/31/2014 04/21/2013  Does Patient Have a Medical Advance Directive? Yes Yes No No Yes Yes Patient does not have advance directive;Patient has advance directive, copy not in chart  Type of Advance Directive Stephanie Zhang;Living will Healthcare Power of [name];Living will Living will  Does patient want to make changes to medical advance directive? No - Patient declined - - - - - -  Copy of Stephanie Zhang in Chart? Yes - validated most  recent copy scanned in chart (See row information) - - - No - copy requested No - copy requested -  Would patient like information on creating a medical advance directive? - - - No - Patient declined - - -    Tobacco Social History   Tobacco Use  Smoking Status Former Smoker  . Last attempt to quit: 05/11/2011  . Years since quitting: 7.7  Smokeless Tobacco Never Used     Counseling given: Not Answered    Past Medical History:  Diagnosis Date  . Anxiety   . Blood transfusion    "when I was born"  . Breast cancer (Stephanie Zhang) 10/2001   Stage II (right side)  . Chronic back pain   . Confusion   . Depression   . Gastroparesis   . GERD (gastroesophageal reflux disease)   . Headache(784.0)    "lots"  . Hypoglycemia   . MS (multiple sclerosis) (Stephanie Zhang) 1987  . Multiple sclerosis   . Osteoporosis   . Short-term memory loss    Past Surgical History:  Procedure Laterality Date  . BREAST BIOPSY  2002   right  . BREAST LUMPECTOMY  2002   right  . COLONOSCOPY N/A 04/21/2013   Procedure: COLONOSCOPY;  Surgeon: Ladene Artist, MD;  Location: [facility];  Service: Endoscopy;  Laterality: N/A;   Family History  Problem Relation Age of Onset  . Diabetes Mother   . Cancer Mother  cervical cancer  . Heart disease Mother   . Diabetes Father    Social History   Socioeconomic History  . Marital status: Widowed    Spouse name: Not on file  . Number of children: 0  . Years of education: Not on file  . Highest education level: Not on file  Occupational History  . Occupation: Retired Astronomer    Comment: Teacher, music shows  Social Needs  . Financial resource strain: Not hard at all  . Food insecurity:    Worry: Never true    Inability: Never true  . Transportation needs:    Medical: No    Non-medical: No  Tobacco Use  . Smoking status: Former Smoker    Last attempt to quit: 05/11/2011    Years since quitting: 7.7  . Smokeless tobacco: Never Used    Substance and Sexual Activity  . Alcohol use: No    Comment: "last alcohol 12/04/11"  . Drug use: No    Types: LSD, Marijuana, "Crack" cocaine    Comment: "last drug use ~ 1980"  . Sexual activity: Never  Lifestyle  . Physical activity:    Days per week: 0 days    Minutes per session: 0 min  . Stress: Not at all  Relationships  . Social connections:    Talks on phone: More than three times a week    Gets together: Once a week    Attends religious service: Never    Active member of club or organization: No    Attends meetings of clubs or organizations: Never    Relationship status: Widowed  Other Topics Concern  . Not on file  Social History Narrative   Lives alone at [facility] assisted living; nursing manages medications.    Has two sisters, one brother; Stephanie Zhang, manages finances; nursing at home manages medications   Enjoys playing cards with friends at home.     Outpatient Encounter Medications as of 02/03/2019  Medication Sig  . acetaminophen (TYLENOL) 500 MG tablet Take 1,000 mg by mouth every 6 (six) hours as needed for mild pain.  [name] buPROPion (WELLBUTRIN) 75 MG tablet Take 2 tablets (150 mg total) by mouth daily.  . Calcium Carbonate-Vitamin D 600-400 MG-UNIT tablet Take 1 tablet by mouth daily.  . Cholecalciferol (VITAMIN D) 50 MCG (2000 UT) tablet Take 1 TAB BY MOUTH EVERY DAY  . docusate sodium (COLACE) 100 MG capsule Take 1 capsule (100 mg total) by mouth 2 (two) times daily.  . famotidine (PEPCID) 20 MG tablet Take 1 tablet (20 mg total) by mouth at bedtime.  . fluticasone (FLONASE) 50 MCG/ACT nasal spray Place 2 sprays into both nostrils daily.  [name] guaiFENesin (ROBITUSSIN) 100 MG/5ML liquid Take 200 mg by mouth every 6 (six) hours as needed for cough.  . hydrocortisone cream 1 % Apply twice daily to affected areas  . lisinopril (PRINIVIL,ZESTRIL) 5 MG tablet Take 1 tablet (5 mg total) by mouth daily.  [name] loperamide (IMODIUM) 2 MG capsule Take 2 mg by mouth  every 4 (four) hours as needed for diarrhea or loose stools.   [name] loratadine (CLARITIN) 10 MG tablet Take 1 tablet (10 mg total) by mouth daily.  . polyethylene glycol (MIRALAX / GLYCOLAX) packet Take 17 g by mouth daily.  . promethazine (PHENERGAN) 25 MG tablet Take 25 mg by mouth every 6 (six) hours as needed for nausea or vomiting.  . senna (SENOKOT) 8.6 MG tablet Take by mouth.  . Skin Protectants, Misc. (  EUCERIN) cream Apply 1 application topically 2 (two) times daily as needed for dry skin.  . [DISCONTINUED] ciprofloxacin (CIPRO) 500 MG tablet Take 1 tablet (500 mg total) by mouth 2 (two) times daily.   No facility-administered encounter medications on file as of 02/03/2019.     Activities of Daily Living In your present state of health, do you have any difficulty performing the following activities: 02/03/2019  Hearing? N  Vision? Y  Difficulty concentrating or making decisions? Y  Walking or climbing stairs? Y  Dressing or bathing? N  Doing errands, shopping? Y  Comment sister Newman Nip and eating ? N  Using the Toilet? N  In the past six months, have you accidently leaked urine? Y  Do you have problems with loss of bowel control? N  Managing your Medications? Y  Managing your Finances? N  Comment sister and brother Engineer, production or managing your Housekeeping? N  Some recent data might be hidden    Patient Care Team: [name], MD as PCP - General (Family Medicine) Fontaine, Belinda Block, MD as Consulting Physician (Gynecology)    Assessment:   This is a routine wellness examination for Stephanie Zhang. Physical assessment deferred to PCP.   Exercise Activities and Dietary recommendations Current Exercise Habits: The patient does not participate in regular exercise at present, Exercise limited by: neurologic condition(s);psychological condition(s);orthopedic condition(s);cardiac condition(s)  Diet (meal preparation, eat out, water intake,  caffeinated beverages, dairy products, fruits and vegetables): in general, an "unhealthy" diet.  Chooses leafy green foods, pt. States, although after re-visiting diet conversation later in visit, pt. admitted to not eating scheduled meals, often skipping breakfast. Pt. was very concerned about her weight, stating she was too fat and needed to lose. Author reassured pt. that her BMI is normal and that there is no acute concern with weight gain; Hydration encouraged d/t hx orthostatic hypotension. Dr. Volanda Zhang made aware of conversation.       Goals    . Patient Stated     Lose 10 pounds by drinking more water!        Fall Risk Fall Risk  02/03/2019 10/14/2018 10/17/2017 12/14/2016 04/13/2016  Falls in the past year? 1 1 No No No  Number falls in past yr: 1 0 - - -  Injury with Fall? 1 1 - - -  Comment - pt fell in shower - hit her head - - -  Risk Factor Category  - - - - -  Risk for fall due to : History of fall(s);Impaired balance/gait;Impaired vision;Impaired mobility;Medication side effect - - - -  Follow up Falls prevention discussed;Education provided - - - -    Depression Screen PHQ 2/9 Scores 02/03/2019 04/13/2016  PHQ - 2 Score 0 0  PHQ- 9 Score 0 -     Cognitive Function       Ad8 score reviewed for issues:  Issues making decisions: yes  Less interest in hobbies / activities: no  Repeats questions, stories (family complaining): no  Trouble using ordinary gadgets (microwave, computer, phone):no  Forgets the month or year: no  Mismanaging finances: yes  Remembering appts: yes  Daily problems with thinking and/or memory: yes Ad8 score is= 4  Pt.has MS, under care of nursing at assisted living. Sisters active in care.   Immunization History  Administered Date(s) Administered  . Influenza Split 10/10/2013  . Influenza-Unspecified 09/10/2016, 09/09/2018    Qualifies for Shingles Vaccine? Yes, advised to receive at local pharmacy, although  pt. not interested at this  time.   Screening Tests Health Maintenance  Topic Date Due  . PAP SMEAR-Modifier  08/09/2014  . TETANUS/TDAP  02/04/2020 (Originally 11/16/1980)  . MAMMOGRAM  10/21/2019  . COLONOSCOPY  04/22/2023  . INFLUENZA VACCINE  Completed  . Hepatitis C Screening  Completed  . HIV Screening  Completed        Plan:    Bone density scan order placed. GI breast will call to schedule at your earliest convenience.  Follow up with Dr. Volanda Zhang or Gyn about pap smear and L knee pain.  Kegel exercise information provided.  See chair exercises provided.  Consider increasing water intake and focus on protein, 3 meals/day to prevent fatigue, and falls especially. I have personally reviewed and noted the following in the patient's chart:   . Medical and social history . Use of alcohol, tobacco or illicit drugs  . Current medications and supplements . Functional ability and status . Nutritional status . Physical activity . Advanced directives . List of other physicians . Vitals . Screenings to include cognitive, depression, and falls . Referrals and appointments  In addition, I have reviewed and discussed with patient certain preventive protocols, quality metrics, and best practice recommendations. A written personalized care plan for preventive services as well as general preventive health recommendations were provided to patient.     Alphia Moh, RN  02/03/2019

## 2019-02-03 ENCOUNTER — Ambulatory Visit (INDEPENDENT_AMBULATORY_CARE_PROVIDER_SITE_OTHER): Payer: Medicare Other

## 2019-02-03 ENCOUNTER — Ambulatory Visit (INDEPENDENT_AMBULATORY_CARE_PROVIDER_SITE_OTHER): Payer: Medicare Other | Admitting: Family Medicine

## 2019-02-03 ENCOUNTER — Encounter: Payer: Self-pay | Admitting: Family Medicine

## 2019-02-03 VITALS — BP 128/88 | HR 76 | Temp 97.9°F | Ht 65.0 in | Wt 143.0 lb

## 2019-02-03 VITALS — BP 128/88 | HR 76 | Temp 97.9°F | Wt 143.0 lb

## 2019-02-03 DIAGNOSIS — Z0000 Encounter for general adult medical examination without abnormal findings: Secondary | ICD-10-CM | POA: Diagnosis not present

## 2019-02-03 DIAGNOSIS — I10 Essential (primary) hypertension: Secondary | ICD-10-CM

## 2019-02-03 DIAGNOSIS — Z13820 Encounter for screening for osteoporosis: Secondary | ICD-10-CM

## 2019-02-03 DIAGNOSIS — G35 Multiple sclerosis: Secondary | ICD-10-CM

## 2019-02-03 NOTE — Patient Instructions (Addendum)
Bone density scan order placed. GI breast will call to schedule at your earliest convenience.  Bring a copy of your living will and/or healthcare power of attorney to your next office visit.  Follow up with Dr. Volanda Napoleon or Gyn about pap smear and L knee pain.  Kegel exercise information provided.  See chair exercises provided.  Consider increasing water intake and focus on protein, 3 meals/day to prevent fatigue, and falls especially.    Ms. Stephanie Zhang , Thank you for taking time to come for your Medicare Wellness Visit. I appreciate your ongoing commitment to your health goals. Please review the following plan we discussed and let me know if I can assist you in the future.   These are the goals we discussed: Goals    . Patient Stated     Lose 10 pounds by drinking more water!        This is a list of the screening recommended for you and due dates:  Health Maintenance  Topic Date Due  . Tetanus Vaccine  11/16/1980  . Pap Smear  08/09/2014  . Flu Shot  07/10/2018  . Mammogram  10/21/2019  . Colon Cancer Screening  04/22/2023  .  Hepatitis C: One time screening is recommended by Center for Disease Control  (CDC) for  adults born from [birth year] through 1965.   Completed  . HIV Screening  Completed    Kegel Exercises Kegel exercises help strengthen the muscles that support the rectum, vagina, small intestine, bladder, and uterus. Doing Kegel exercises can help:  Improve bladder and bowel control.  Improve sexual response.  Reduce problems and discomfort during pregnancy. Kegel exercises involve squeezing your pelvic floor muscles, which are the same muscles you squeeze when you try to stop the flow of urine. The exercises can be done while sitting, standing, or lying down, but it is best to vary your position. Exercises 1. Squeeze your pelvic floor muscles tight. You should feel a tight lift in your rectal area. If you are a female, you should also feel a tightness in your vaginal area.  Keep your stomach, buttocks, and legs relaxed. 2. Hold the muscles tight for up to 10 seconds. 3. Relax your muscles. Repeat this exercise 50 times a day or as many times as told by your health care provider. Continue to do this exercise for at least 4-6 weeks or for as long as told by your health care provider. This information is not intended to replace advice given to you by your health care provider. Make sure you discuss any questions you have with your health care provider. Document Released: 11/12/2012 Document Revised: 04/08/2017 Document Reviewed: 10/16/2015 Elsevier Interactive Patient Education  2019 [employer].  Exercises To Do While Sitting  Exercises that you do while sitting (chair exercises) can give you many of the same benefits as full exercise. Benefits include strengthening your heart, burning calories, and keeping muscles and joints healthy. Exercise can also improve your mood and help with depression and anxiety. You may benefit from chair exercises if you are unable to do standing exercises because of:  Diabetic foot pain.  Obesity.  Illness.  Arthritis.  Recovery from surgery or injury.  Breathing problems.  Balance problems.  Another type of disability. Before starting chair exercises, check with your health care provider or a physical therapist to find out how much exercise you can tolerate and which exercises are safe for you. If your health care provider approves:  Start out slowly and build  up over time. Aim to work up to about 10-20 minutes for each exercise session.  Make exercise part of your daily routine.  Drink water when you exercise. Do not wait until you are thirsty. Drink every 10-15 minutes.  Stop exercising right away if you have pain, nausea, shortness of breath, or dizziness.  If you are exercising in a wheelchair, make sure to lock the wheels.  Ask your health care provider whether you can do tai chi or yoga. Many positions in  these mind-body exercises can be modified to do while seated. Warm-up Before starting other exercises: 1. Sit up as straight as you can. Have your knees bent at 90 degrees, which is the shape of the capital letter "L." Keep your feet flat on the floor. 2. Sit at the front edge of your chair, if you can. 3. Pull in (tighten) the muscles in your abdomen and stretch your spine and neck as straight as you can. Hold this position for a few minutes. 4. Breathe in and out evenly. Try to concentrate on your breathing, and relax your mind. Stretching Exercise A: Arm stretch 1. Hold your arms out straight in front of your body. 2. Bend your hands at the wrist with your fingers pointing up, as if signaling someone to stop. Notice the slight tension in your forearms as you hold the position. 3. Keeping your arms out and your hands bent, rotate your hands outward as far as you can and hold this stretch. Aim to have your thumbs pointing up and your pinkie fingers pointing down. Slowly repeat arm stretches for one minute as tolerated. Exercise B: Leg stretch 1. If you can move your legs, try to "draw" letters on the floor with the toes of your foot. Write your name with one foot. 2. Write your name with the toes of your other foot. Slowly repeat the movements for one minute as tolerated. Exercise C: Reach for the sky 1. Reach your hands as far over your head as you can to stretch your spine. 2. Move your hands and arms as if you are climbing a rope. Slowly repeat the movements for one minute as tolerated. Range of motion exercises Exercise A: Shoulder roll 1. Let your arms hang loosely at your sides. 2. Lift just your shoulders up toward your ears, then let them relax back down. 3. When your shoulders feel loose, rotate your shoulders in backward and forward circles. Do shoulder rolls slowly for one minute as tolerated. Exercise B: March in place 1. As if you are marching, pump your arms and lift your  legs up and down. Lift your knees as high as you can. ? If you are unable to lift your knees, just pump your arms and move your ankles and feet up and down. March in place for one minute as tolerated. Exercise C: Seated jumping jacks 1. Let your arms hang down straight. 2. Keeping your arms straight, lift them up over your head. Aim to point your fingers to the ceiling. 3. While you lift your arms, straighten your legs and slide your heels along the floor to your sides, as wide as you can. 4. As you bring your arms back down to your sides, slide your legs back together. ? If you are unable to use your legs, just move your arms. Slowly repeat seated jumping jacks for one minute as tolerated. Strengthening exercises Exercise A: Shoulder squeeze 1. Hold your arms straight out from your body to your sides, with  your elbows bent and your fists pointed at the ceiling. 2. Keeping your arms in the bent position, move them forward so your elbows and forearms meet in front of your face. 3. Open your arms back out as wide as you can with your elbows still bent, until you feel your shoulder blades squeezing together. Hold for 5 seconds. Slowly repeat the movements forward and backward for one minute as tolerated. Contact a health care provider if you:  Had to stop exercising due to any of the following: ? Pain. ? Nausea. ? Shortness of breath. ? Dizziness. ? Fatigue.  Have significant pain or soreness after exercising. Get help right away if you have:  Chest pain.  Difficulty breathing. These symptoms may represent a serious problem that is an emergency. Do not wait to see if the symptoms will go away. Get medical help right away. Call your local emergency services (911 in the [place] Do not drive yourself to the hospital. This information is not intended to replace advice given to you by your health care provider. Make sure you discuss any questions you have with your health care  provider. Document Released: 10/09/2017 Document Revised: 10/09/2017 Document Reviewed: 10/09/2017 Elsevier Interactive Patient Education  2019 [place] Maintenance, Female Adopting a healthy lifestyle and getting preventive care can go a long way to promote health and wellness. Talk with your health care provider about what schedule of regular examinations is right for you. This is a good chance for you to check in with your provider about disease prevention and staying healthy. In between checkups, there are plenty of things you can do on your own. Experts have done a lot of research about which lifestyle changes and preventive measures are most likely to keep you healthy. Ask your health care provider for more information. Weight and diet Eat a healthy diet  Be sure to include plenty of vegetables, fruits, low-fat dairy products, and lean protein.  Do not eat a lot of foods high in solid fats, added sugars, or salt.  Get regular exercise. This is one of the most important things you can do for your health. ? Most adults should exercise for at least 150 minutes each week. The exercise should increase your heart rate and make you sweat (moderate-intensity exercise). ? Most adults should also do strengthening exercises at least twice a week. This is in addition to the moderate-intensity exercise. Maintain a healthy weight  Body mass index (BMI) is a measurement that can be used to identify possible weight problems. It estimates body fat based on height and weight. Your health care provider can help determine your BMI and help you achieve or maintain a healthy weight.  For females 84 years of age and older: ? A BMI below 18.5 is considered underweight. ? A BMI of 18.5 to 24.9 is normal. ? A BMI of 25 to 29.9 is considered overweight. ? A BMI of 30 and above is considered obese. Watch levels of cholesterol and blood lipids  You should start having your blood tested for lipids  and cholesterol at 58 years of age, then have this test every 5 years.  You may need to have your cholesterol levels checked more often if: ? Your lipid or cholesterol levels are high. ? You are older than 58 years of age. ? You are at high risk for heart disease. Cancer screening Lung Cancer  Lung cancer screening is recommended for adults 80-72 years old who are at high risk  for lung cancer because of a history of smoking.  A yearly low-dose CT scan of the lungs is recommended for people who: ? Currently smoke. ? Have quit within the past 15 years. ? Have at least a 30-pack-year history of smoking. A pack year is smoking an average of one pack of cigarettes a day for 1 year.  Yearly screening should continue until it has been 15 years since you quit.  Yearly screening should stop if you develop a health problem that would prevent you from having lung cancer treatment. Breast Cancer  Practice breast self-awareness. This means understanding how your breasts normally appear and feel.  It also means doing regular breast self-exams. Let your health care provider know about any changes, no matter how small.  If you are in your 20s or 30s, you should have a clinical breast exam (CBE) by a health care provider every 1-3 years as part of a regular health exam.  If you are 45 or older, have a CBE every year. Also consider having a breast X-ray (mammogram) every year.  If you have a family history of breast cancer, talk to your health care provider about genetic screening.  If you are at high risk for breast cancer, talk to your health care provider about having an MRI and a mammogram every year.  Breast cancer gene (BRCA) assessment is recommended for women who have family members with BRCA-related cancers. BRCA-related cancers include: ? Breast. ? Ovarian. ? Tubal. ? Peritoneal cancers.  Results of the assessment will determine the need for genetic counseling and BRCA1 and BRCA2  testing. Cervical Cancer Your health care provider may recommend that you be screened regularly for cancer of the pelvic organs (ovaries, uterus, and vagina). This screening involves a pelvic examination, including checking for microscopic changes to the surface of your cervix (Pap test). You may be encouraged to have this screening done every 3 years, beginning at age 34.  For women ages 35-65, health care providers may recommend pelvic exams and Pap testing every 3 years, or they may recommend the Pap and pelvic exam, combined with testing for human papilloma virus (HPV), every 5 years. Some types of HPV increase your risk of cervical cancer. Testing for HPV may also be done on women of any age with unclear Pap test results.  Other health care providers may not recommend any screening for nonpregnant women who are considered low risk for pelvic cancer and who do not have symptoms. Ask your health care provider if a screening pelvic exam is right for you.  If you have had past treatment for cervical cancer or a condition that could lead to cancer, you need Pap tests and screening for cancer for at least 20 years after your treatment. If Pap tests have been discontinued, your risk factors (such as having a new sexual partner) need to be reassessed to determine if screening should resume. Some women have medical problems that increase the chance of getting cervical cancer. In these cases, your health care provider may recommend more frequent screening and Pap tests. Colorectal Cancer  This type of cancer can be detected and often prevented.  Routine colorectal cancer screening usually begins at 58 years of age and continues through 58 years of age.  Your health care provider may recommend screening at an earlier age if you have risk factors for colon cancer.  Your health care provider may also recommend using home test kits to check for hidden blood in the stool.  A small camera at the end of a  tube can be used to examine your colon directly (sigmoidoscopy or colonoscopy). This is done to check for the earliest forms of colorectal cancer.  Routine screening usually begins at age 57.  Direct examination of the colon should be repeated every 5-10 years through 58 years of age. However, you may need to be screened more often if early forms of precancerous polyps or small growths are found. Skin Cancer  Check your skin from head to toe regularly.  Tell your health care provider about any new moles or changes in moles, especially if there is a change in a mole's shape or color.  Also tell your health care provider if you have a mole that is larger than the size of a pencil eraser.  Always use sunscreen. Apply sunscreen liberally and repeatedly throughout the day.  Protect yourself by wearing long sleeves, pants, a wide-brimmed hat, and sunglasses whenever you are outside. Heart disease, diabetes, and high blood pressure  High blood pressure causes heart disease and increases the risk of stroke. High blood pressure is more likely to develop in: ? People who have blood pressure in the high end of the normal range (130-139/85-89 mm Hg). ? People who are overweight or obese. ? People who are African American.  If you are 33-27 years of age, have your blood pressure checked every 3-5 years. If you are 79 years of age or older, have your blood pressure checked every year. You should have your blood pressure measured twice-once when you are at a hospital or clinic, and once when you are not at a hospital or clinic. Record the average of the two measurements. To check your blood pressure when you are not at a hospital or clinic, you can use: ? An automated blood pressure machine at a pharmacy. ? A home blood pressure monitor.  If you are between 28 years and 96 years old, ask your health care provider if you should take aspirin to prevent strokes.  Have regular diabetes screenings. This  involves taking a blood sample to check your fasting blood sugar level. ? If you are at a normal weight and have a low risk for diabetes, have this test once every three years after 58 years of age. ? If you are overweight and have a high risk for diabetes, consider being tested at a younger age or more often. Preventing infection Hepatitis B  If you have a higher risk for hepatitis B, you should be screened for this virus. You are considered at high risk for hepatitis B if: ? You were born in a country where hepatitis B is common. Ask your health care provider which countries are considered high risk. ? Your parents were born in a high-risk country, and you have not been immunized against hepatitis B (hepatitis B vaccine). ? You have HIV or AIDS. ? You use needles to inject street drugs. ? You live with someone who has hepatitis B. ? You have had sex with someone who has hepatitis B. ? You get hemodialysis treatment. ? You take certain medicines for conditions, including cancer, organ transplantation, and autoimmune conditions. Hepatitis C  Blood testing is recommended for: ? Everyone born from [birth year] through 1965. ? Anyone with known risk factors for hepatitis C. Sexually transmitted infections (STIs)  You should be screened for sexually transmitted infections (STIs) including gonorrhea and chlamydia if: ? You are sexually active and are younger than 58 years of age. ?  You are older than 58 years of age and your health care provider tells you that you are at risk for this type of infection. ? Your sexual activity has changed since you were last screened and you are at an increased risk for chlamydia or gonorrhea. Ask your health care provider if you are at risk.  If you do not have HIV, but are at risk, it may be recommended that you take a prescription medicine daily to prevent HIV infection. This is called pre-exposure prophylaxis (PrEP). You are considered at risk if: ? You are  sexually active and do not regularly use condoms or know the HIV status of your partner(s). ? You take drugs by injection. ? You are sexually active with a partner who has HIV. Talk with your health care provider about whether you are at high risk of being infected with HIV. If you choose to begin PrEP, you should first be tested for HIV. You should then be tested every 3 months for as long as you are taking PrEP. Pregnancy  If you are premenopausal and you may become pregnant, ask your health care provider about preconception counseling.  If you may become pregnant, take 400 to 800 micrograms (mcg) of folic acid every day.  If you want to prevent pregnancy, talk to your health care provider about birth control (contraception). Osteoporosis and menopause  Osteoporosis is a disease in which the bones lose minerals and strength with aging. This can result in serious bone fractures. Your risk for osteoporosis can be identified using a bone density scan.  If you are 61 years of age or older, or if you are at risk for osteoporosis and fractures, ask your health care provider if you should be screened.  Ask your health care provider whether you should take a calcium or vitamin D supplement to lower your risk for osteoporosis.  Menopause may have certain physical symptoms and risks.  Hormone replacement therapy may reduce some of these symptoms and risks. Talk to your health care provider about whether hormone replacement therapy is right for you. Follow these instructions at home:  Schedule regular health, dental, and eye exams.  Stay current with your immunizations.  Do not use any tobacco products including cigarettes, chewing tobacco, or electronic cigarettes.  If you are pregnant, do not drink alcohol.  If you are breastfeeding, limit how much and how often you drink alcohol.  Limit alcohol intake to no more than 1 drink per day for nonpregnant women. One drink equals 12 ounces of  beer, 5 ounces of wine, or 1 ounces of hard liquor.  Do not use street drugs.  Do not share needles.  Ask your health care provider for help if you need support or information about quitting drugs.  Tell your health care provider if you often feel depressed.  Tell your health care provider if you have ever been abused or do not feel safe at home. This information is not intended to replace advice given to you by your health care provider. Make sure you discuss any questions you have with your health care provider. Document Released: 06/11/2011 Document Revised: 05/03/2016 Document Reviewed: 08/30/2015 Elsevier Interactive Patient Education  2019 [employer].

## 2019-02-03 NOTE — Progress Notes (Signed)
Subjective:    Patient ID: Stephanie Zhang, female    DOB: 20-Jun-1961, 58 y.o.   MRN: [id number]  No chief complaint on file. Pt is accompanied by her sister.  HPI Patient was seen today for f/u.  She was also seen this am for wellness visit.  Pt states she has been doing well.  Pt is concerned about gaining 2 lbs since last OFV.  States she needs to eat less.  Pt resides at an ALF for h/o MS.  Pt notes she had a recent fall while calling [employer].  Pt states she is unsure how she fell.  Pt states she did not hurt herself as a result of the fall.  Pt's blood pressure being checked regularly.  Per log BP 122/88, 128/76, 136/88, 142/74, 129/81, 100/76.  Patient taking lisinopril 5 mg.  Denies headaches, chest pain, changes in vision.  Pt inquires about Pap.  Pt seen by OB/GYN.  Past Medical History:  Diagnosis Date  . Anxiety   . Blood transfusion    "when I was born"  . Breast cancer ([place]) 10/2001   Stage II (right side)  . Chronic back pain   . Confusion   . Depression   . Gastroparesis   . GERD (gastroesophageal reflux disease)   . Headache(784.0)    "lots"  . Hypoglycemia   . MS (multiple sclerosis) ([place]) 1987  . Multiple sclerosis   . Osteoporosis   . Short-term memory loss     Allergies  Allergen Reactions  . Amantadines   . Sulfa Antibiotics Itching and Rash  . Sulfacetamide Sodium Itching and Rash    ROS General: Denies fever, chills, night sweats, changes in weight, changes in appetite HEENT: Denies headaches, ear pain, changes in vision, rhinorrhea, sore throat CV: Denies CP, palpitations, SOB, orthopnea Pulm: Denies SOB, cough, wheezing GI: Denies abdominal pain, nausea, vomiting, diarrhea, constipation GU: Denies dysuria, hematuria, frequency, vaginal discharge Msk: Denies muscle cramps, joint pains Neuro: Denies weakness, numbness, tingling Skin: Denies rashes, bruising Psych: Denies depression, anxiety, hallucinations    Objective:    Blood pressure  128/88, pulse 76, temperature 97.9 F (36.6 C), temperature source Oral, weight 143 lb (64.9 kg), last menstrual period 08/09/2008.  Gen. Pleasant, well-nourished, in no distress, normal affect   HEENT: [place]/AT, face symmetric, no scleral icterus, PERRLA, nares patent without drainage, pharynx without erythema or exudate. TMs normal b/l. Lungs: no accessory muscle use, CTAB, no wheezes or rales Cardiovascular: RRR, no m/r/g, no peripheral edema Abdomen: BS present, soft, NT/ND Neuro:  A&Ox3, CN II-XII intact, using walker Skin:  Warm, no lesions/ rash  Wt Readings from Last 3 Encounters:  02/03/19 143 lb (64.9 kg)  02/03/19 143 lb (64.9 kg)  11/11/18 141 lb (64 kg)    Lab Results  Component Value Date   WBC 6.8 11/02/2018   HGB 13.1 11/02/2018   HCT 42.6 11/02/2018   PLT 193 11/02/2018   GLUCOSE 80 11/02/2018   CHOL 153 12/14/2016   TRIG 92.0 12/14/2016   HDL 62.80 12/14/2016   LDLCALC 72 12/14/2016   ALT 12 10/17/2017   AST 16 10/17/2017   NA 139 11/02/2018   K 3.5 11/02/2018   CL 104 11/02/2018   CREATININE 0.88 11/02/2018   BUN 13 11/02/2018   CO2 27 11/02/2018   TSH 2.25 04/12/2017    Assessment/Plan:  Essential hypertension -controlled -continue lisinopril 5 mg  -continue lifestyle modifications.  MS (multiple sclerosis) ([place])  -stable -continue using walker to aid  with ambulation -Pt advised to continue  f/u with OB/Gyn for pap.  would benefit from a barrier free exam table.  F/u prn   Grier Mitts, MD

## 2019-02-07 ENCOUNTER — Encounter: Payer: Self-pay | Admitting: Family Medicine

## 2019-04-03 ENCOUNTER — Telehealth: Payer: Self-pay | Admitting: *Deleted

## 2019-04-03 NOTE — Telephone Encounter (Signed)
Copied from [place] [phone]. Topic: General - Other >> Apr 03, 2019  9:12 AM Antonieta Iba C wrote: Reason for CRM: Lelan Pons with [facility] called in because she said that they received pharmacy recommendation form back from provider however an option of providers preference was not check . Lelan Pons would like to have this form updated and re-faxed to them.    If a new form is needed   [phone]

## 2019-04-07 NOTE — Telephone Encounter (Signed)
Form received and is being updated

## 2019-04-08 ENCOUNTER — Other Ambulatory Visit: Payer: Medicare Other

## 2019-04-08 NOTE — Telephone Encounter (Signed)
Form has been updated and faxed back to [facility]

## 2019-04-13 ENCOUNTER — Telehealth: Payer: Self-pay | Admitting: *Deleted

## 2019-04-13 NOTE — Telephone Encounter (Signed)
Copied from [place] [id number]. Topic: General - Other >> Apr 13, 2019 10:23 AM Yvette Rack wrote: Reason for CRM: Lelan Pons with [facility] called for an update on a fax that was sent on 04/02/19. Cb# [phone]

## 2019-04-14 ENCOUNTER — Ambulatory Visit: Payer: Medicare Other | Admitting: Neurology

## 2019-04-14 NOTE — Progress Notes (Signed)
Virtual Visit via Telephone Note The purpose of this virtual visit is to provide medical care while limiting exposure to the novel coronavirus.    Consent was obtained for phone visit:  Yes.   Answered questions that patient had about telehealth interaction:  Yes.   I discussed the limitations, risks, security and privacy concerns of performing an evaluation and management service by telephone. I also discussed with the patient that there may be a patient responsible charge related to this service. The patient expressed understanding and agreed to proceed.  Pt location: Home Physician Location: office Name of referring provider:  [name], MD I connected with .Stephanie Zhang at patients initiation/request on 04/15/2019 at 10:30 AM EDT by telephone and verified that I am speaking with the correct person using two identifiers.  Pt MRN:  [id number] Pt DOB:  1961/11/06   History of Present Illness:  Stephanie Zhang is a 58 year old female with chronic back pain, osteoporosis, GERD, thrombocytopenia and history of breast cancer who follows up for multiple sclerosis.  UPDATE: Current DMT:  None Other medications: Bupropion 75 mg daily for depression, D3 2000 IU daily  Since last year, she reports she is physically stable.  Vision: Occasional blurry vision but nothing significant. Motor: No issues Sensory: No issues Pain: No issues Gait: Able to ambulate with walker Bowel/Bladder: Stress incontinence.   Fatigue: Yes Cognition: Short-term memory deficits.  Does not remember certain family members are dead.  No recent visual hallucinations such as involving animals.   Mood: She is short-tempered.    HISTORY: She was diagnosed with multiple sclerosis at around age 33. She reports left sided numbness, ataxia, and visual disturbance. She had bilateral INO. She did undergo a lumbar puncture at the time.[name]  She was initially on Avonex, which was switched to Rebif in 2004 due to  change in insurance.  Rebif was subsequently denied due to diagnosis of SPMS.  She has required treatment of flare-ups with Solu-Medrol, usually presenting as worsening gait with falls. As per prior neurologist's notes, her last steroid treatment was in 2010. She has had a progressive physical decline since then.  Her ex-husband passed away on 01/24/17. Since then, she reports visual and auditory hallucinations. In the middle of the night, she may see and hear her ex-husband outside the window. On at least one occasion, she woke up in the middle of the night and heard knocking on her window. She saw somebody outside speaking [place]. She told the nurses and they didn't see anyone. One afternoon, she was walking in the courtyard and saw a [place]. Her friend didn't see anything. Later, when she was in the dining room, she saw the [place] outside the window. Sometimes, these hallucinations are frighthening. They occur 2 to 3 times a week.  She currently lives in assisted living at [facility]. At baseline, she exhibits extra-pyramidal symptoms such as tremor. She has memory deficits. She requires use of a walker since 2005. She has neurogenic bladder. She needs to push down on her stomach in order to help void. She sometimes has constipation or diarrhea.  MRI of brain with and without contrast from 09/08/16 reports "no change in the appearance of numerous subcortical, periventricular, and deep white matter T2 hyperintense lesions" when compared to prior imaging from 10/23/08. However, it also reports mild interval enlargement of a meningioma "in the left frontal dura which measures 5 x 11 mm, previously 2 x 10 mm." She was referred to neurosurgery for evaluation of  meningioma. Continued monitoring was recommended.  MRI of brain with and without contrast from 10/22/17 was stable compared to prior study from 09/08/16 with no evidence of progression or contrast enhancing lesions. Left  frontal convexity meningioma is stable at 8 by 7 mm.  MRI of brain with and without contrast from 10/07/18 was stable compared to prior MRI from 10/22/17.    Observations/Objective:   Alert and oriented.  Speech fluent and not dysarthric.  Language intact.   Assessment and Plan:   Multiple sclerosis  1.  Continue vitamin D 2.  Follow up in 9 months.   Follow Up Instructions:    -I discussed the assessment and treatment plan with the patient. The patient was provided an opportunity to ask questions and all were answered. The patient agreed with the plan and demonstrated an understanding of the instructions.   The patient was advised to call back or seek an in-person evaluation if the symptoms worsen or if the condition fails to improve as anticipated.    Total Time spent in visit with the patient was:  15 minutes  Dudley Major, DO

## 2019-04-15 ENCOUNTER — Other Ambulatory Visit: Payer: Self-pay

## 2019-04-15 ENCOUNTER — Encounter: Payer: Self-pay | Admitting: Neurology

## 2019-04-15 ENCOUNTER — Telehealth (INDEPENDENT_AMBULATORY_CARE_PROVIDER_SITE_OTHER): Payer: Medicare Other | Admitting: Neurology

## 2019-04-15 DIAGNOSIS — G35 Multiple sclerosis: Secondary | ICD-10-CM

## 2019-04-15 NOTE — Telephone Encounter (Signed)
Pt fax was sent to [facility]

## 2019-05-29 ENCOUNTER — Telehealth: Payer: Self-pay | Admitting: Family Medicine

## 2019-05-29 NOTE — Telephone Encounter (Signed)
Want to know the status of the form that was sent on 6.10.20 saying the pt refused the Flonase and what should they do. Please advise

## 2019-06-04 ENCOUNTER — Other Ambulatory Visit: Payer: Medicare Other

## 2019-06-04 NOTE — Telephone Encounter (Signed)
Stephanie Zhang called from [facility] and stated that they have faxed on 6.10.20 and called in reference to the Pt denying her fluticasone (FLONASE) 50 MCG/ACT nasal spray  Every morning. They need it DC'd and an order for that/ please advise

## 2019-06-04 NOTE — Telephone Encounter (Signed)
Spoke with Dr Volanda Napoleon who advised to D/C if pt refusing. Copy to D/C the Rx was faxed to [facility] and confirmation received

## 2019-07-30 ENCOUNTER — Inpatient Hospital Stay: Admission: RE | Admit: 2019-07-30 | Payer: Medicare Other | Source: Ambulatory Visit

## 2019-09-18 ENCOUNTER — Emergency Department (HOSPITAL_COMMUNITY)
Admission: EM | Admit: 2019-09-18 | Discharge: 2019-09-19 | Disposition: A | Discharge status: Home / Self Care | Payer: Medicare Other | Attending: Emergency Medicine | Admitting: Emergency Medicine

## 2019-09-18 ENCOUNTER — Other Ambulatory Visit: Payer: Self-pay

## 2019-09-18 ENCOUNTER — Emergency Department (HOSPITAL_COMMUNITY): Payer: Medicare Other

## 2019-09-18 DIAGNOSIS — M542 Cervicalgia: Secondary | ICD-10-CM | POA: Diagnosis not present

## 2019-09-18 DIAGNOSIS — Z853 Personal history of malignant neoplasm of breast: Secondary | ICD-10-CM | POA: Diagnosis not present

## 2019-09-18 DIAGNOSIS — Y93G1 Activity, food preparation and clean up: Secondary | ICD-10-CM | POA: Insufficient documentation

## 2019-09-18 DIAGNOSIS — G44309 Post-traumatic headache, unspecified, not intractable: Secondary | ICD-10-CM

## 2019-09-18 DIAGNOSIS — Y92011 Dining room of single-family (private) house as the place of occurrence of the external cause: Secondary | ICD-10-CM | POA: Insufficient documentation

## 2019-09-18 DIAGNOSIS — W07XXXA Fall from chair, initial encounter: Secondary | ICD-10-CM | POA: Insufficient documentation

## 2019-09-18 DIAGNOSIS — Y998 Other external cause status: Secondary | ICD-10-CM | POA: Diagnosis not present

## 2019-09-18 DIAGNOSIS — Z87891 Personal history of nicotine dependence: Secondary | ICD-10-CM | POA: Insufficient documentation

## 2019-09-18 DIAGNOSIS — R519 Headache, unspecified: Secondary | ICD-10-CM | POA: Diagnosis present

## 2019-09-18 DIAGNOSIS — Z79899 Other long term (current) drug therapy: Secondary | ICD-10-CM | POA: Diagnosis not present

## 2019-09-18 DIAGNOSIS — I10 Essential (primary) hypertension: Secondary | ICD-10-CM | POA: Insufficient documentation

## 2019-09-18 DIAGNOSIS — W19XXXA Unspecified fall, initial encounter: Secondary | ICD-10-CM

## 2019-09-18 LAB — CBC WITH DIFFERENTIAL/PLATELET
Abs Immature Granulocytes: 0.03 10*3/uL (ref 0.00–0.07)
Basophils Absolute: 0 10*3/uL (ref 0.0–0.1)
Basophils Relative: 1 %
Eosinophils Absolute: 0.1 10*3/uL (ref 0.0–0.5)
Eosinophils Relative: 1 %
HCT: 41.4 % (ref 36.0–46.0)
Hemoglobin: 13.7 g/dL (ref 12.0–15.0)
Immature Granulocytes: 0 %
Lymphocytes Relative: 18 %
Lymphs Abs: 1.3 10*3/uL (ref 0.7–4.0)
MCH: 31.6 pg (ref 26.0–34.0)
MCHC: 33.1 g/dL (ref 30.0–36.0)
MCV: 95.6 fL (ref 80.0–100.0)
Monocytes Absolute: 1 10*3/uL (ref 0.1–1.0)
Monocytes Relative: 13 %
Neutro Abs: 4.9 10*3/uL (ref 1.7–7.7)
Neutrophils Relative %: 67 %
Platelets: 156 10*3/uL (ref 150–400)
RBC: 4.33 MIL/uL (ref 3.87–5.11)
RDW: 12.5 % (ref 11.5–15.5)
WBC: 7.3 10*3/uL (ref 4.0–10.5)
nRBC: 0 % (ref 0.0–0.2)

## 2019-09-18 LAB — BASIC METABOLIC PANEL
Anion gap: 10 (ref 5–15)
BUN: 14 mg/dL (ref 6–20)
CO2: 21 mmol/L — ABNORMAL LOW (ref 22–32)
Calcium: 8.8 mg/dL — ABNORMAL LOW (ref 8.9–10.3)
Chloride: 107 mmol/L (ref 98–111)
Creatinine, Ser: 1.01 mg/dL — ABNORMAL HIGH (ref 0.44–1.00)
GFR calc Af Amer: >60 mL/min (ref >60)
GFR calc non Af Amer: >60 mL/min (ref >60)
Glucose, Bld: 118 mg/dL — ABNORMAL HIGH (ref 70–99)
Potassium: 4.2 mmol/L (ref 3.5–5.1)
Sodium: 138 mmol/L (ref 135–145)

## 2019-09-18 MED ORDER — KETOROLAC TROMETHAMINE 30 MG/ML IJ SOLN
30.0000 mg | Freq: Once | INTRAMUSCULAR | Status: AC
  Administered 2019-09-18: 30 mg via INTRAVENOUS
  Filled 2019-09-18: qty 1

## 2019-09-18 MED ORDER — KETOROLAC TROMETHAMINE 30 MG/ML IJ SOLN: 30.0000 mg | Freq: Once | INTRAMUSCULAR | Status: DC

## 2019-09-18 MED ORDER — CYCLOBENZAPRINE HCL 10 MG PO TABS
5.0000 mg | ORAL_TABLET | Freq: Once | ORAL | Status: AC
  Administered 2019-09-18: 5 mg via ORAL
  Filled 2019-09-18: qty 1

## 2019-09-18 MED ORDER — IBUPROFEN 600 MG PO TABS: 600.0000 mg | ORAL_TABLET | Freq: Four times a day (QID) | ORAL | 0 refills | Status: AC | PRN

## 2019-09-18 MED ORDER — HYDROMORPHONE HCL 1 MG/ML IJ SOLN
0.5000 mg | Freq: Once | INTRAMUSCULAR | Status: AC
  Administered 2019-09-18: 0.5 mg via INTRAVENOUS
  Filled 2019-09-18: qty 1

## 2019-09-18 MED ORDER — CYCLOBENZAPRINE HCL 5 MG PO TABS: 5.0000 mg | ORAL_TABLET | Freq: Two times a day (BID) | ORAL | 0 refills | Status: AC | PRN

## 2019-09-18 NOTE — Discharge Instructions (Signed)
You can take 1 to 2 tablets of Tylenol (350mg -1000mg  depending on the dose) every 6 hours as needed for pain.  Do not exceed 4000 mg of Tylenol daily.  If your pain persists you can take a doses of ibuprofen in between doses of Tylenol.  I usually recommend 400 to 600 mg of ibuprofen every 6 hours.  Take this with food to avoid upset stomach issues.  You can take flexeril as needed for muscle relaxation but this medication can cause sedation so do not drive, drink alcohol, or operate heavy machinery while taking this medicine.  I usually recommend only taking this medication at night to help you sleep.  Apply ice or heat, whichever feels best for comfort.  I usually recommend 20 minutes at a time 2-3 times daily.  Take some hot showers and hot baths throughout the day and do some gentle stretching to avoid muscle stiffness.   You may have a concussion, which is head injury without evidence of abnormality on CT scan.  Stay in a dimly lit room with minimal stimuli, reduce screen time (phone, TV) by at least 50% if not more, and avoid contact sports until cleared by a physician.  I have given you the contact information for Dr. Hulan Saas with [facility] who runs a concussion clinic.  You can follow-up with him if your symptoms persist.   Return to the emergency department immediately for any concerning signs or symptoms develop such as altered mental status, persistent vomiting, weakness, severe swelling or redness of a limb, or fevers.

## 2019-09-18 NOTE — ED Provider Notes (Signed)
[facility] EMERGENCY DEPARTMENT Provider Note   CSN: [id number] Arrival date & time: 09/18/19  2102     History   Chief Complaint Chief Complaint  Patient presents with   Fall    HPI Stephanie Zhang is a 58 y.o. female with history of MS, breast cancer, chronic back pain, gastroparesis, osteoporosis presents for evaluation of acute onset, progressively worsening headache, neck pain left-sided facial pain secondary to fall.  She reports around 6 PM yesterday she was sitting in a chair and had just finished eating dinner when she slid to the floor.  She does not think that she struck her head or loss consciousness but does have aching pain to the left cheekbone as well as generalized headache and neck pain "to the back "which worsens with turning her head to the right.  Headache is aching.  Denies vision changes, nausea, vomiting, chest pain, shortness of breath, abdominal pain, or urinary symptoms.  She has taken Tylenol without relief of her symptoms.  Reports some tingling to her toes which she states is not unusual for her.  She is not anticoagulated.  She sometimes ambulates with the aid of a walker but is mostly wheelchair-bound.    The history is provided by the patient.    Past Medical History:  Diagnosis Date   Anxiety    Blood transfusion    "when I was born"   Breast cancer ([place]) 10/2001   Stage II (right side)   Chronic back pain    Confusion    Depression    Gastroparesis    GERD (gastroesophageal reflux disease)    Headache(784.0)    "lots"   Hypoglycemia    MS (multiple sclerosis) ([place]) 1987   Multiple sclerosis    Osteoporosis    Short-term memory loss     Patient Active Problem List   Diagnosis Date Noted   Essential hypertension 07/22/2018   Special screening for malignant neoplasms, colon 04/21/2013   Gastroparesis 04/18/2012   Weight loss, unintentional 04/16/2012   Malnutrition ([place]) 04/16/2012   MS (multiple  sclerosis) ([place])    Cancer ([place])    Hypoglycemia    MULTIPLE SCLEROSIS, RELAPSING/REMITTING 02/16/2010   WEAKNESS 02/16/2010   GAIT ATAXIA 02/16/2010   RECTAL BLEEDING, HX OF 02/13/2010   BREAST CANCER, HX OF 08/06/2007    Past Surgical History:  Procedure Laterality Date   BREAST BIOPSY  2002   right   BREAST LUMPECTOMY  2002   right   COLONOSCOPY N/A 04/21/2013   Procedure: COLONOSCOPY;  Surgeon: Ladene Artist, MD;  Location: [facility] ENDOSCOPY;  Service: Endoscopy;  Laterality: N/A;     OB History    Gravida  1   Para  0   Term      Preterm      AB  1   Living  0     SAB      TAB  1   Ectopic      Multiple      Live Births               Home Medications    Prior to Admission medications   Medication Sig Start Date End Date Taking? Authorizing Provider  acetaminophen (TYLENOL) 500 MG tablet Take 1,000 mg by mouth every 6 (six) hours as needed for mild pain.    [provider]  buPROPion (WELLBUTRIN) 75 MG tablet Take 2 tablets (150 mg total) by mouth daily. 11/09/16   Bluford Kaufmann  F, MD  Calcium Carbonate-Vitamin D 600-400 MG-UNIT tablet Take 1 tablet by mouth daily. 06/06/17   Marletta Lor, MD  Cholecalciferol (VITAMIN D) 50 MCG (2000 UT) tablet Take 1 TAB BY MOUTH EVERY DAY 01/14/19   [name], MD  cyclobenzaprine (FLEXERIL) 5 MG tablet Take 1 tablet (5 mg total) by mouth 2 (two) times daily as needed for muscle spasms. 09/18/19   Kennedi Lizardo A, PA-C  docusate sodium (COLACE) 100 MG capsule Take 1 capsule (100 mg total) by mouth 2 (two) times daily. 01/03/17   Marletta Lor, MD  fluticasone (FLONASE) 50 MCG/ACT nasal spray Place 2 sprays into both nostrils daily. 04/11/18   Nafziger, Tommi Rumps, NP  guaiFENesin (ROBITUSSIN) 100 MG/5ML liquid Take 200 mg by mouth every 6 (six) hours as needed for cough.    [provider]  hydrocortisone cream 1 % Apply twice daily to affected areas 01/20/18   [name], MD    ibuprofen (ADVIL) 600 MG tablet Take 1 tablet (600 mg total) by mouth every 6 (six) hours as needed for moderate pain. 09/18/19   Aretta Stetzel A, PA-C  lisinopril (PRINIVIL,ZESTRIL) 5 MG tablet Take 1 tablet (5 mg total) by mouth daily. 01/20/18   [name], MD  loperamide (IMODIUM) 2 MG capsule Take 2 mg by mouth every 4 (four) hours as needed for diarrhea or loose stools.     [provider]  loratadine (CLARITIN) 10 MG tablet Take 1 tablet (10 mg total) by mouth daily. 04/11/18   Nafziger, Tommi Rumps, NP  polyethylene glycol (MIRALAX / GLYCOLAX) packet Take 17 g by mouth daily. 07/17/17   Marletta Lor, MD  promethazine (PHENERGAN) 25 MG tablet Take 25 mg by mouth every 6 (six) hours as needed for nausea or vomiting.    [provider]  senna (SENOKOT) 8.6 MG tablet Take by mouth.    [provider]  Skin Protectants, Misc. (EUCERIN) cream Apply 1 application topically 2 (two) times daily as needed for dry skin. 04/07/18   Marletta Lor, MD    Family History Family History  Problem Relation Age of Onset   Diabetes Mother    Cancer Mother        cervical cancer   Heart disease Mother    Diabetes Father     Social History Social History   Tobacco Use   Smoking status: Former Smoker    Quit date: 05/11/2011    Years since quitting: 8.3   Smokeless tobacco: Never Used  Substance Use Topics   Alcohol use: No    Comment: "last alcohol 12/04/11"   Drug use: No    Types: LSD, Marijuana, "Crack" cocaine    Comment: "last drug use ~ 1980"     Allergies   Amantadines, Sulfa antibiotics, and Sulfacetamide sodium   Review of Systems Review of Systems  Constitutional: Negative for chills and fever.  Eyes: Negative for visual disturbance.  Respiratory: Negative for shortness of breath.   Cardiovascular: Negative for chest pain.  Gastrointestinal: Negative for abdominal pain, nausea and vomiting.  Genitourinary: Negative for dysuria,  frequency and urgency.  Neurological: Positive for headaches. Negative for syncope.  All other systems reviewed and are negative.    Physical Exam Updated Vital Signs BP (!) 118/101    Pulse 91    Temp 98 F (36.7 C) (Oral)    Resp 19    Ht 5\' 4"  (1.626 m)    Wt 68 kg  LMP 08/09/2008    SpO2 100%    BMI 25.75 kg/m   Physical Exam Vitals signs and nursing note reviewed.  Constitutional:      General: She is not in acute distress.    Appearance: She is well-developed.  HENT:     Head: Normocephalic and atraumatic.     Comments: No Battle's signs, no raccoon's eyes, no rhinorrhea. No hemotympanum. No tenderness to palpation of the skull. Mild left zygomatic arch tenderness but no deformity, crepitus, or swelling noted.  Eyes:     General:        Right eye: No discharge.        Left eye: No discharge.     Extraocular Movements: Extraocular movements intact.     Conjunctiva/sclera: Conjunctivae normal.     Pupils: Pupils are equal, round, and reactive to light.  Neck:     Vascular: No JVD.     Trachea: No tracheal deviation.     Comments: c-collar in place Cardiovascular:     Rate and Rhythm: Normal rate and regular rhythm.     Pulses: Normal pulses.  Pulmonary:     Effort: Pulmonary effort is normal.     Breath sounds: Normal breath sounds.     Comments: Atraumatic examination with no deformity, crepitus, ecchymosis, or flail segment.  Speaking in full sentences without difficulty. Chest:     Chest wall: No tenderness.  Abdominal:     General: Abdomen is flat. Bowel sounds are normal. There is no distension.     Palpations: Abdomen is soft.  Musculoskeletal: Normal range of motion.        General: No tenderness.     Comments: No midline spine TTP, no paraspinal muscle tenderness, no deformity, crepitus, or step-off noted.  No tenderness to palpation or crepitus of the bilateral upper and lower extremities.  Skin:    General: Skin is warm and dry.     Findings: No  erythema.  Neurological:     Mental Status: She is alert.     Comments: Fluent speech with no facial droop.  Cranial nerves grossly intact. Tremulousness to the bilateral upper extremities with movement of the hands. 5/5 strength of BUE and BLE major muscle groups. Sensation intact light touch of bilateral upper and lower extremities.  Psychiatric:        Behavior: Behavior normal.      ED Treatments / Results  Labs (all labs ordered are listed, but only abnormal results are displayed) Labs Reviewed  BASIC METABOLIC PANEL - Abnormal; Notable for the following components:      Result Value   CO2 21 (*)    Glucose, Bld 118 (*)    Creatinine, Ser 1.01 (*)    Calcium 8.8 (*)    All other components within normal limits  CBC WITH DIFFERENTIAL/PLATELET    EKG None  Radiology Ct Head Wo Contrast  Result Date: 09/18/2019 CLINICAL DATA:  Fall. Found down. EXAM: CT HEAD WITHOUT CONTRAST CT MAXILLOFACIAL WITHOUT CONTRAST CT CERVICAL SPINE WITHOUT CONTRAST TECHNIQUE: Multidetector CT imaging of the head, cervical spine, and maxillofacial structures were performed using the standard protocol without intravenous contrast. Multiplanar CT image reconstructions of the cervical spine and maxillofacial structures were also generated. COMPARISON:  Brain MRI 10/06/2018, head CT 03/26/2014 FINDINGS: CT HEAD FINDINGS Brain: There is no mass, hemorrhage or extra-axial collection. Chronic white matter hypoattenuation. No hydrocephalus. Mild generalized volume loss. Vascular: No abnormal hyperdensity of the major intracranial arteries or dural venous sinuses. No  intracranial atherosclerosis. Skull: The visualized skull base, calvarium and extracranial soft tissues are normal. CT MAXILLOFACIAL FINDINGS Osseous: --Complex facial fracture types: No LeFort, zygomaticomaxillary complex or nasoorbitoethmoidal fracture. --Simple fracture types: None. --Mandible: No fracture or dislocation. Orbits: The globes are  intact. Normal appearance of the intra- and extraconal fat. Symmetric extraocular muscles and optic nerves. Sinuses: No fluid levels or advanced mucosal thickening. Soft tissues: Normal visualized extracranial soft tissues. CT CERVICAL SPINE FINDINGS Alignment: Grade 1 anterolisthesis at C4-5. Skull base and vertebrae: No acute fracture. Soft tissues and spinal canal: No prevertebral fluid or swelling. No visible canal hematoma. Disc levels: Multilevel facet arthrosis, greatest in the mid cervical spine. Disc space narrowing is worst at C5-7. Upper chest: No pneumothorax, pulmonary nodule or pleural effusion. Other: Normal visualized paraspinal cervical soft tissues. IMPRESSION: 1. Chronic white matter disease without acute intracranial abnormality. 2. No facial fracture. 3. No acute fracture or static subluxation of the cervical spine. Electronically Signed   By: Ulyses Jarred M.D.   On: 09/18/2019 23:18   Ct Cervical Spine Wo Contrast  Result Date: 09/18/2019 CLINICAL DATA:  Fall. Found down. EXAM: CT HEAD WITHOUT CONTRAST CT MAXILLOFACIAL WITHOUT CONTRAST CT CERVICAL SPINE WITHOUT CONTRAST TECHNIQUE: Multidetector CT imaging of the head, cervical spine, and maxillofacial structures were performed using the standard protocol without intravenous contrast. Multiplanar CT image reconstructions of the cervical spine and maxillofacial structures were also generated. COMPARISON:  Brain MRI 10/06/2018, head CT 03/26/2014 FINDINGS: CT HEAD FINDINGS Brain: There is no mass, hemorrhage or extra-axial collection. Chronic white matter hypoattenuation. No hydrocephalus. Mild generalized volume loss. Vascular: No abnormal hyperdensity of the major intracranial arteries or dural venous sinuses. No intracranial atherosclerosis. Skull: The visualized skull base, calvarium and extracranial soft tissues are normal. CT MAXILLOFACIAL FINDINGS Osseous: --Complex facial fracture types: No LeFort, zygomaticomaxillary complex or  nasoorbitoethmoidal fracture. --Simple fracture types: None. --Mandible: No fracture or dislocation. Orbits: The globes are intact. Normal appearance of the intra- and extraconal fat. Symmetric extraocular muscles and optic nerves. Sinuses: No fluid levels or advanced mucosal thickening. Soft tissues: Normal visualized extracranial soft tissues. CT CERVICAL SPINE FINDINGS Alignment: Grade 1 anterolisthesis at C4-5. Skull base and vertebrae: No acute fracture. Soft tissues and spinal canal: No prevertebral fluid or swelling. No visible canal hematoma. Disc levels: Multilevel facet arthrosis, greatest in the mid cervical spine. Disc space narrowing is worst at C5-7. Upper chest: No pneumothorax, pulmonary nodule or pleural effusion. Other: Normal visualized paraspinal cervical soft tissues. IMPRESSION: 1. Chronic white matter disease without acute intracranial abnormality. 2. No facial fracture. 3. No acute fracture or static subluxation of the cervical spine. Electronically Signed   By: Ulyses Jarred M.D.   On: 09/18/2019 23:18   Ct Maxillofacial Wo Cm  Result Date: 09/18/2019 CLINICAL DATA:  Fall. Found down. EXAM: CT HEAD WITHOUT CONTRAST CT MAXILLOFACIAL WITHOUT CONTRAST CT CERVICAL SPINE WITHOUT CONTRAST TECHNIQUE: Multidetector CT imaging of the head, cervical spine, and maxillofacial structures were performed using the standard protocol without intravenous contrast. Multiplanar CT image reconstructions of the cervical spine and maxillofacial structures were also generated. COMPARISON:  Brain MRI 10/06/2018, head CT 03/26/2014 FINDINGS: CT HEAD FINDINGS Brain: There is no mass, hemorrhage or extra-axial collection. Chronic white matter hypoattenuation. No hydrocephalus. Mild generalized volume loss. Vascular: No abnormal hyperdensity of the major intracranial arteries or dural venous sinuses. No intracranial atherosclerosis. Skull: The visualized skull base, calvarium and extracranial soft tissues are  normal. CT MAXILLOFACIAL FINDINGS Osseous: --Complex facial fracture types: No LeFort,  zygomaticomaxillary complex or nasoorbitoethmoidal fracture. --Simple fracture types: None. --Mandible: No fracture or dislocation. Orbits: The globes are intact. Normal appearance of the intra- and extraconal fat. Symmetric extraocular muscles and optic nerves. Sinuses: No fluid levels or advanced mucosal thickening. Soft tissues: Normal visualized extracranial soft tissues. CT CERVICAL SPINE FINDINGS Alignment: Grade 1 anterolisthesis at C4-5. Skull base and vertebrae: No acute fracture. Soft tissues and spinal canal: No prevertebral fluid or swelling. No visible canal hematoma. Disc levels: Multilevel facet arthrosis, greatest in the mid cervical spine. Disc space narrowing is worst at C5-7. Upper chest: No pneumothorax, pulmonary nodule or pleural effusion. Other: Normal visualized paraspinal cervical soft tissues. IMPRESSION: 1. Chronic white matter disease without acute intracranial abnormality. 2. No facial fracture. 3. No acute fracture or static subluxation of the cervical spine. Electronically Signed   By: Ulyses Jarred M.D.   On: 09/18/2019 23:18    Procedures Procedures (including critical care time)  Medications Ordered in ED Medications  HYDROmorphone (DILAUDID) injection 0.5 mg (0.5 mg Intravenous Given 09/18/19 2221)  cyclobenzaprine (FLEXERIL) tablet 5 mg (5 mg Oral Given 09/18/19 2340)  ketorolac (TORADOL) 30 MG/ML injection 30 mg (30 mg Intravenous Given 09/18/19 2340)     Initial Impression / Assessment and Plan / ED Course  I have reviewed the triage vital signs and the nursing notes.  Pertinent labs & imaging results that were available during my care of the patient were reviewed by me and considered in my medical decision making (see chart for details).        Patient presenting for evaluation after mechanical fall yesterday.  She does not think that she struck her head and denies loss of  consciousness but does have some left-sided facial pain, generalized headache, posterior neck pain.  She is afebrile, vital signs are stable.  She is nontoxic in appearance.  No focal neurologic deficits aside from tremulousness of the upper extremities bilaterally which is chronic due to her MS.  No evidence of serious intrathoracic or intra-abdominal injury on examination.  No midline spine tenderness.  Lab work reviewed by me shows no leukocytosis, no anemia, no metabolic derangements.  Mild elevation in creatinine but BUN is within normal limits.  Imaging today shows no evidence of acute intracranial abnormality, no skull fracture, no facial fracture, no acute cervical spine injury.  On reevaluation patient resting comfortably in no apparent distress.  Her pain is been managed well in the ED.  Discussed conservative therapy and management of her symptoms with NSAIDs, Tylenol, muscle relaxer as needed.  Discussed appropriate use of these medications and potential side effects.  She does do exercises at her facility on a daily basis and I encouraged her to continue with the use as tolerated.  Recommend follow-up with PCP for reevaluation of symptoms within 1 week.  Discussed strict ED return precautions. Patient verbalized understanding of and agreement with plan and is safe for discharge home at this time.   Final Clinical Impressions(s) / ED Diagnoses   Final diagnoses:  Fall, initial encounter  Post-traumatic headache, not intractable, unspecified chronicity pattern  Neck pain    ED Discharge Orders         Ordered    cyclobenzaprine (FLEXERIL) 5 MG tablet  2 times daily PRN     09/18/19 2331    ibuprofen (ADVIL) 600 MG tablet  Every 6 hours PRN     09/18/19 2331           Rodell Perna A, PA-C 09/19/19 0006  Davonna Belling, MD 09/19/19 [phone]

## 2019-09-18 NOTE — ED Notes (Signed)
Patient transported to CT 

## 2019-09-18 NOTE — ED Triage Notes (Signed)
Pt brought in by EMS from [facility]. Pt HX of MS with fall at some time last night. Uncertain of time and length of time on floor. Per EMS, staff reported pt began to complain of neck pain, headache, and facial pain. Upon ED arrival pt reports neck pain 9 out 10. A&Ox4. Pt able to move all extremities at this time. C collar placed at triage.

## 2019-09-18 NOTE — ED Notes (Signed)
Discharge instructions discussed with RN at [facility]. Pt to return to [facility] with [facility]

## 2019-09-18 NOTE — ED Notes (Signed)
Family at bedside. 

## 2019-09-18 NOTE — ED Notes (Signed)
Discharge instructions discussed with pt. Pt verbalized understanding.

## 2019-09-19 NOTE — ED Notes (Signed)
[facility] transporting pt at this time

## 2019-10-12 ENCOUNTER — Ambulatory Visit
Admission: RE | Admit: 2019-10-12 | Discharge: 2019-10-12 | Disposition: A | Discharge status: Home / Self Care | Payer: Medicare Other | Source: Ambulatory Visit | Attending: Family Medicine | Admitting: Family Medicine

## 2019-10-12 ENCOUNTER — Other Ambulatory Visit: Payer: Self-pay

## 2019-10-12 DIAGNOSIS — Z13820 Encounter for screening for osteoporosis: Secondary | ICD-10-CM

## 2020-01-14 NOTE — Progress Notes (Deleted)
NEUROLOGY FOLLOW UP OFFICE NOTE  Stephanie Zhang XB:[id number]  HISTORY OF PRESENT ILLNESS: Stephanie Zhang is a 59 year old female with chronic back pain, osteoporosis, GERD, thrombocytopenia and history of breast cancer who follows up for multiple sclerosis.  UPDATE: Current DMT:  None Other medications:Bupropion 75 mg daily for depression, D3 2000 IU daily  Since last year, she reports she is physically stable. She did present to the ED on 09/18/2019 after she tripped and fell with subsequent headache and neck pain.  CT of head, maxillofacial and cervical spine personally reviewed revealed no acute or traumatic abnormalities.   Vision:Occasional blurry vision but nothing significant. Motor:No issues Sensory:No issues Pain:No issues Gait:Able to ambulate with walker Bowel/Bladder:Stress incontinence.  Fatigue:Yes Cognition:Short-term memory deficits. Does not remember certain family members are dead. No recent visual hallucinations such as involving animals.  Mood: She is short-tempered.  HISTORY: She was diagnosed with multiple sclerosis at around age 81. She reports left sided numbness, ataxia, and visual disturbance. She had bilateral INO. She did undergo a lumbar puncture at the time.[name]  She was initially on Avonex, which was switched to Rebif in 2004 due to change in insurance.Rebif was subsequently denied due to diagnosis of SPMS.  She has required treatment of flare-ups with Solu-Medrol, usually presenting as worsening gait with falls. As per prior neurologist's notes, her last steroid treatment was in 2010. She has had a progressive physical decline since then.  Her ex-husband passed away on 2017/01/16. Since then, she reports visual and auditory hallucinations. In the middle of the night, she may see and hear her ex-husband outside the window. On at least one occasion, she woke up in the middle of the night and heard knocking on her window. She saw  somebody outside speaking [place]. She told the nurses and they didn't see anyone. One afternoon, she was walking in the courtyard and saw a [place]. Her friend didn't see anything. Later, when she was in the dining room, she saw the [place] outside the window. Sometimes, these hallucinations are frighthening. They occur 2 to 3 times a week.  She currently lives in assisted living at [facility]. At baseline, she exhibits extra-pyramidal symptoms such as tremor. She has memory deficits. She requires use of a walker since 2005. She has neurogenic bladder. She needs to push down on her stomach in order to help void. She sometimes has constipation or diarrhea.  MRI of brain with and without contrast from 09/08/16 reports "no change in the appearance of numerous subcortical, periventricular, and deep white matter T2 hyperintense lesions"when compared to prior imaging from 10/23/08. However, it also reports mild interval enlargement of a meningioma "in the left frontal dura which measures 5 x 11 mm, previously 2 x 10 mm."She was referred to neurosurgery for evaluation of meningioma. Continued monitoring was recommended. MRI of brain with and without contrast from 10/22/17 was stable compared to prior study from 09/08/16 with no evidence of progression or contrast enhancing lesions. Left frontal convexity meningioma is stable at 8 by 7 mm.  MRI of brain with and without contrast from 10/07/18 was stable compared to prior MRI from 10/22/17.  PAST MEDICAL HISTORY: Past Medical History:  Diagnosis Date  . Anxiety   . Blood transfusion    "when I was born"  . Breast cancer ([place]) 10/2001   Stage II (right side)  . Chronic back pain   . Confusion   . Depression   . Gastroparesis   . GERD (gastroesophageal reflux disease)   .  Headache(784.0)    "lots"  . Hypoglycemia   . MS (multiple sclerosis) ([place]) 1987  . Multiple sclerosis   . Osteoporosis   . Short-term memory loss      MEDICATIONS: Current Outpatient Medications on File Prior to Visit  Medication Sig Dispense Refill  . acetaminophen (TYLENOL) 500 MG tablet Take 1,000 mg by mouth every 6 (six) hours as needed for mild pain.    [name] buPROPion (WELLBUTRIN) 75 MG tablet Take 2 tablets (150 mg total) by mouth daily. 60 tablet 5  . Calcium Carbonate-Vitamin D 600-400 MG-UNIT tablet Take 1 tablet by mouth daily. 30 tablet 5  . Cholecalciferol (VITAMIN D) 50 MCG (2000 UT) tablet Take 1 TAB BY MOUTH EVERY DAY 30 tablet 6  . cyclobenzaprine (FLEXERIL) 5 MG tablet Take 1 tablet (5 mg total) by mouth 2 (two) times daily as needed for muscle spasms. 6 tablet 0  . docusate sodium (COLACE) 100 MG capsule Take 1 capsule (100 mg total) by mouth 2 (two) times daily. 180 capsule 1  . fluticasone (FLONASE) 50 MCG/ACT nasal spray Place 2 sprays into both nostrils daily. 16 g 2  . guaiFENesin (ROBITUSSIN) 100 MG/5ML liquid Take 200 mg by mouth every 6 (six) hours as needed for cough.    . hydrocortisone cream 1 % Apply twice daily to affected areas 45 g 0  . ibuprofen (ADVIL) 600 MG tablet Take 1 tablet (600 mg total) by mouth every 6 (six) hours as needed for moderate pain. 30 tablet 0  . lisinopril (PRINIVIL,ZESTRIL) 5 MG tablet Take 1 tablet (5 mg total) by mouth daily. 30 tablet 3  . loperamide (IMODIUM) 2 MG capsule Take 2 mg by mouth every 4 (four) hours as needed for diarrhea or loose stools.     [name] loratadine (CLARITIN) 10 MG tablet Take 1 tablet (10 mg total) by mouth daily. 30 tablet 2  . polyethylene glycol (MIRALAX / GLYCOLAX) packet Take 17 g by mouth daily. 100 each 11  . promethazine (PHENERGAN) 25 MG tablet Take 25 mg by mouth every 6 (six) hours as needed for nausea or vomiting.    . senna (SENOKOT) 8.6 MG tablet Take by mouth.    . Skin Protectants, Misc. (EUCERIN) cream Apply 1 application topically 2 (two) times daily as needed for dry skin. 454 g 0   No current facility-administered medications on file prior to  visit.    ALLERGIES: Allergies  Allergen Reactions  . Amantadines   . Sulfa Antibiotics Itching and Rash  . Sulfacetamide Sodium Itching and Rash    FAMILY HISTORY: Family History  Problem Relation Age of Onset  . Diabetes Mother   . Cancer Mother        cervical cancer  . Heart disease Mother   . Diabetes Father    ***.  SOCIAL HISTORY: Social History   Socioeconomic History  . Marital status: Widowed    Spouse name: Not on file  . Number of children: 0  . Years of education: Not on file  . Highest education level: Not on file  Occupational History  . Occupation: Retired Astronomer    Comment: Teacher, music shows  Tobacco Use  . Smoking status: Former Smoker    Quit date: 05/11/2011    Years since quitting: 8.6  . Smokeless tobacco: Never Used  Substance and Sexual Activity  . Alcohol use: No    Comment: "last alcohol 12/04/11"  . Drug use: No    Types: LSD, Marijuana, "Crack"  cocaine    Comment: "last drug use ~ 1980"  . Sexual activity: Never  Other Topics Concern  . Not on file  Social History Narrative   Lives alone at [facility] assisted [facility]; nursing manages medications.    Has two sisters, one brother; [name], manages finances; nursing at home manages medications   Enjoys playing cards with friends at home.    Social Determinants of Health   Financial Resource Strain: Low Risk   . Difficulty of Paying Living Expenses: Not hard at all  Food Insecurity: No Food Insecurity  . Worried About Charity fundraiser in the Last Year: Never true  . Ran Out of Food in the Last Year: Never true  Transportation Needs: No Transportation Needs  . Lack of Transportation (Medical): No  . Lack of Transportation (Non-Medical): No  Physical Activity: Inactive  . Days of Exercise per Week: 0 days  . Minutes of Exercise per Session: 0 min  Stress: No Stress Concern Present  . Feeling of Stress : Not at all  Social Connections:  Moderately Isolated  . Frequency of Communication with Friends and Family: More than three times a week  . Frequency of Social Gatherings with Friends and Family: Once a week  . Attends Religious Services: Never  . Active Member of Clubs or Organizations: No  . Attends Archivist Meetings: Never  . Marital Status: Widowed  Intimate Partner Violence:   . Fear of Current or Ex-Partner: Not on file  . Emotionally Abused: Not on file  . Physically Abused: Not on file  . Sexually Abused: Not on file    REVIEW OF SYSTEMS: Constitutional: No fevers, chills, or sweats, no generalized fatigue, change in appetite Eyes: No visual changes, double vision, eye pain Ear, nose and throat: No hearing loss, ear pain, nasal congestion, sore throat Cardiovascular: No chest pain, palpitations Respiratory:  No shortness of breath at rest or with exertion, wheezes GastrointestinaI: No nausea, vomiting, diarrhea, abdominal pain, fecal incontinence Genitourinary:  No dysuria, urinary retention or frequency Musculoskeletal:  No neck pain, back pain Integumentary: No rash, pruritus, skin lesions Neurological: as above Psychiatric: No depression, insomnia, anxiety Endocrine: No palpitations, fatigue, diaphoresis, mood swings, change in appetite, change in weight, increased thirst Hematologic/Lymphatic:  No purpura, petechiae. Allergic/Immunologic: no itchy/runny eyes, nasal congestion, recent allergic reactions, rashes  PHYSICAL EXAM: *** General: No acute distress.  Patient appears ***-groomed.   Head:  Normocephalic/atraumatic Eyes:  Fundi examined but not visualized Neck: supple, no paraspinal tenderness, full range of motion Heart:  Regular rate and rhythm Lungs:  Clear to auscultation bilaterally Back: No paraspinal tenderness Neurological Exam: alert and oriented to person, place, and time. Attention span and concentration intact, recent and remote memory intact, fund of knowledge intact.   Speech fluent and not dysarthric, language intact.  CN II-XII intact. Bulk and tone normal, muscle strength 5/5 throughout.  Sensation to light touch, temperature and vibration intact.  Deep tendon reflexes 2+ throughout, toes downgoing.  Finger to nose and heel to shin testing intact.  Gait normal, Romberg negative.  IMPRESSION: ***  PLAN: ***  Metta Clines, DO  CC: ***

## 2020-01-16 IMAGING — CT CT MAXILLOFACIAL W/O CM
4 of 13 series · 15 of 47 positions shown, 17 images · non-contrast
Comparison: MRI head October 06, 2018 and CT cervical spine May 12, 2017.

CLINICAL DATA: Fall from chair, struck RIGHT head. No loss of
consciousness. Pain and headache. History of multiple sclerosis,
breast cancer.

EXAM:
CT HEAD WITHOUT CONTRAST
CT MAXILLOFACIAL WITHOUT CONTRAST
CT CERVICAL SPINE WITHOUT CONTRAST
TECHNIQUE: Multidetector CT imaging of the head, cervical spine, and
maxillofacial structures were performed using the standard protocol
without intravenous contrast. Multiplanar CT image reconstructions
of the cervical spine and maxillofacial structures were also
generated.

[Series 5: head bone · axial · 0.42mm/px · z∈[+1016,+1106]mm · 4 of 77 slices shown]
[im 16/77  bone]
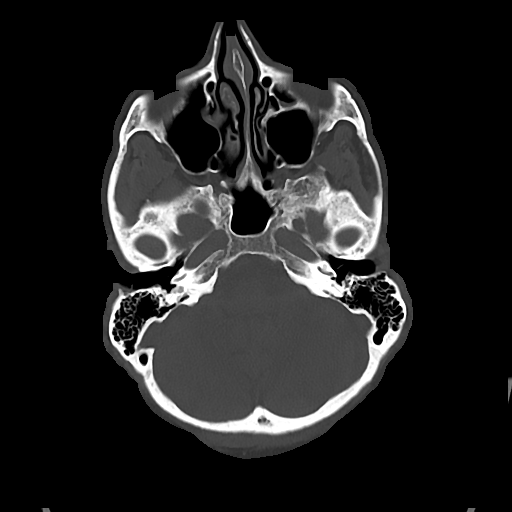
[im 31/77  bone]
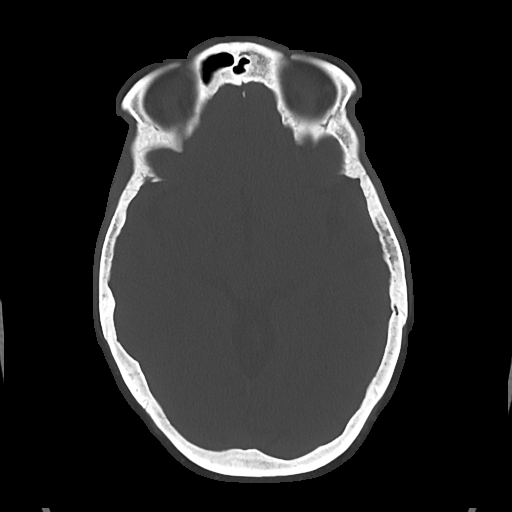
[im 46/77  bone]
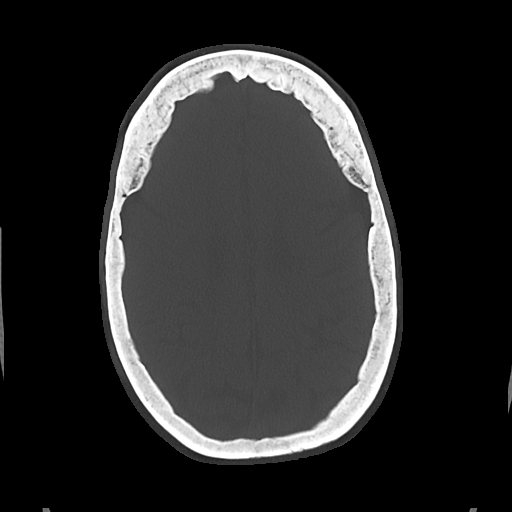
[im 61/77  bone]
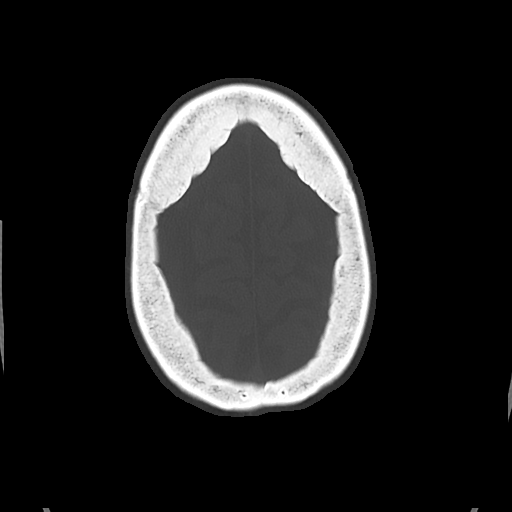

[Series 8: maxilllofacial 2.0 hr40 3 · axial · 0.32mm/px · z∈[+934,+1040]mm · 5 of 81 slices shown, 7 images]
[im 14/81  brain]
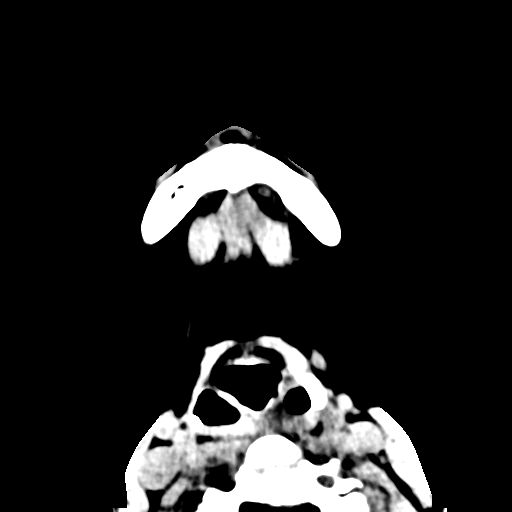
[im 14/81  bone]
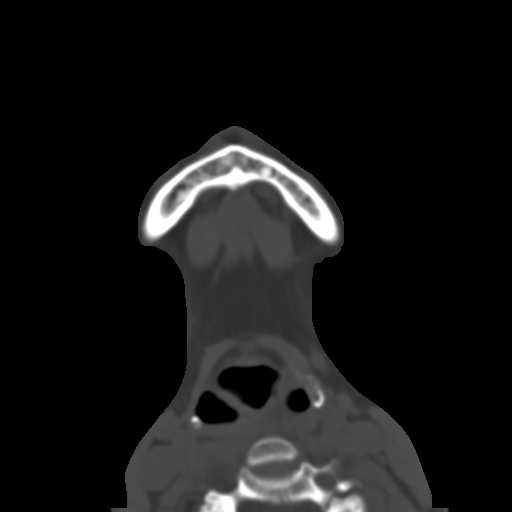
[im 27/81  bone]
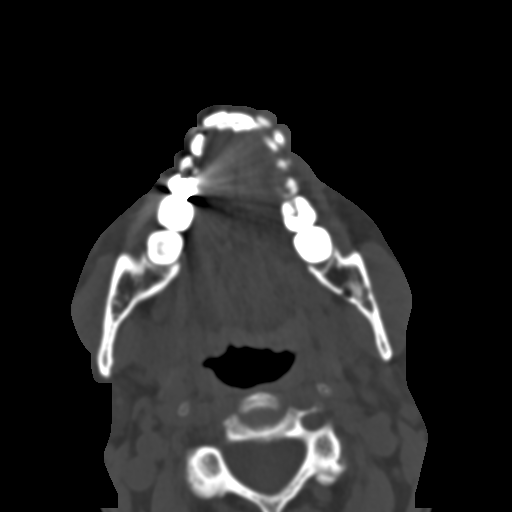
[im 41/81  bone]
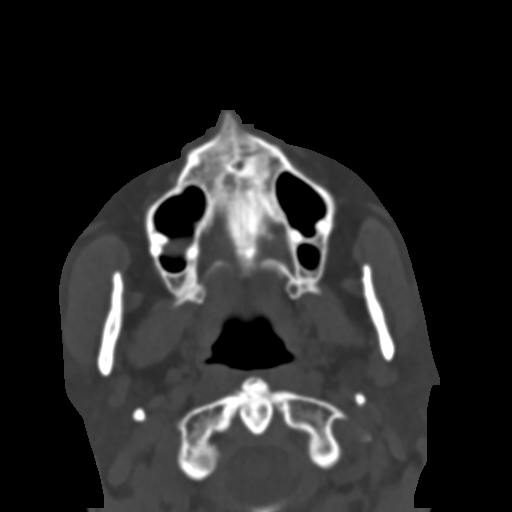
[im 54/81  bone]
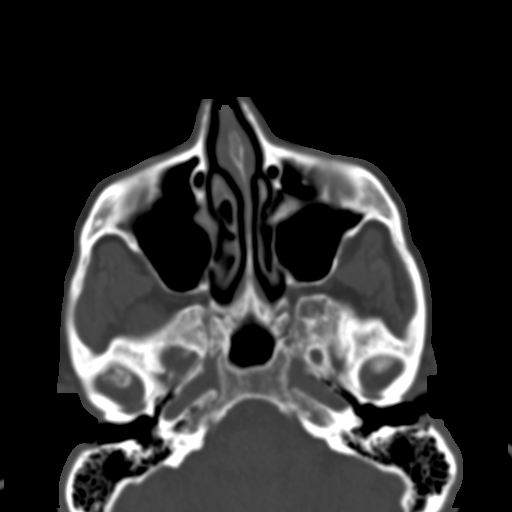
[im 67/81  brain]
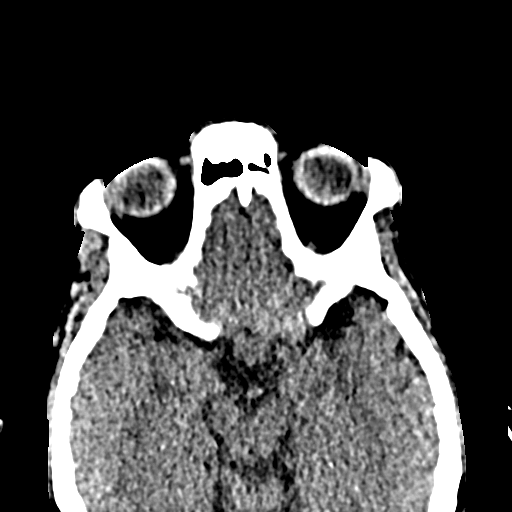
[im 67/81  bone]
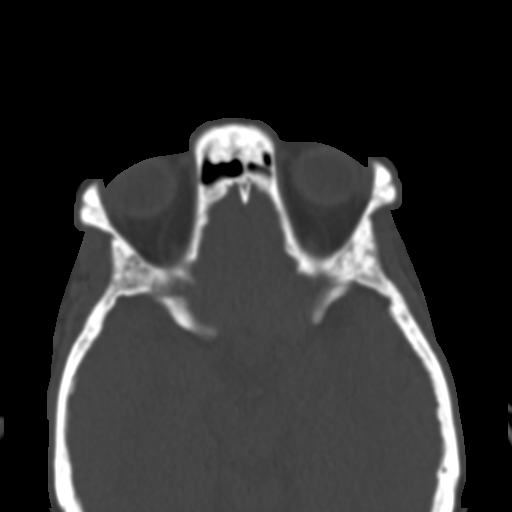

[Series 10: maxilllofacial 2.0 hr59 3 · axial · 0.32mm/px · z∈[+934,+1040]mm · 5 of 81 slices shown]
[im 14/81  bone]
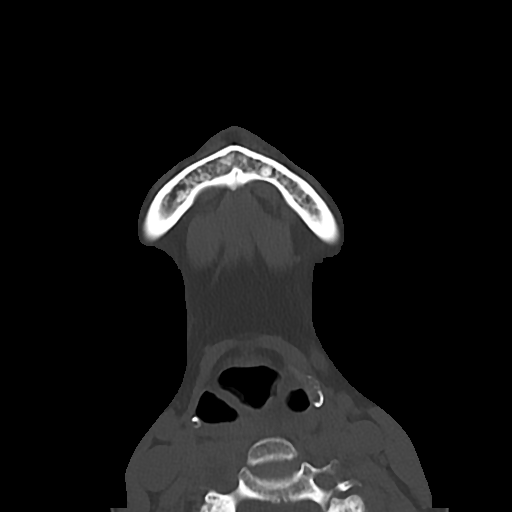
[im 27/81  bone]
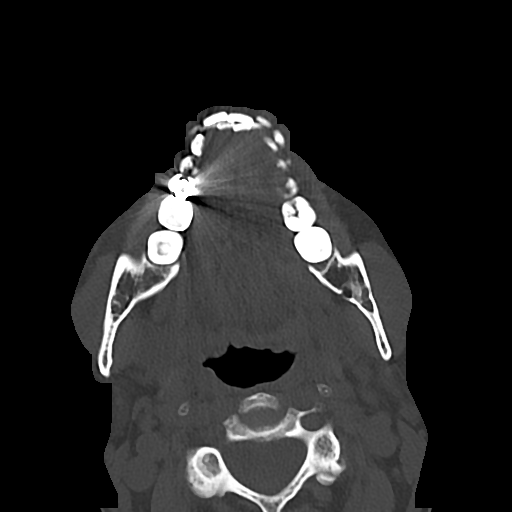
[im 41/81  bone]
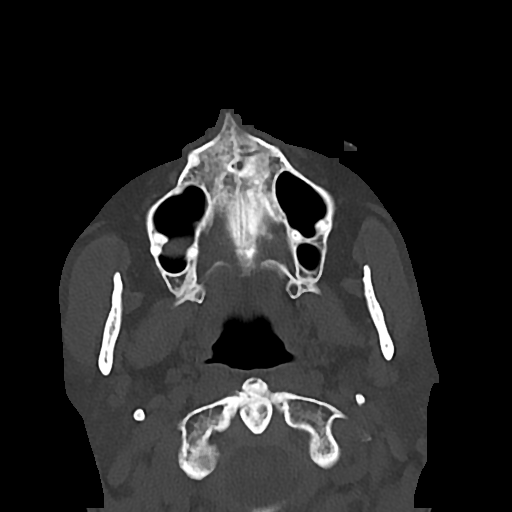
[im 54/81  bone]
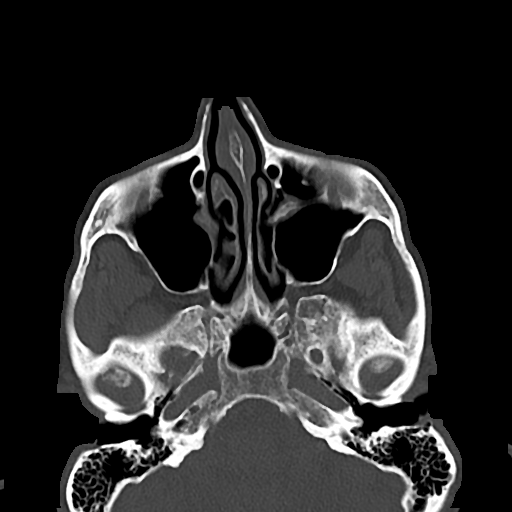
[im 67/81  bone]
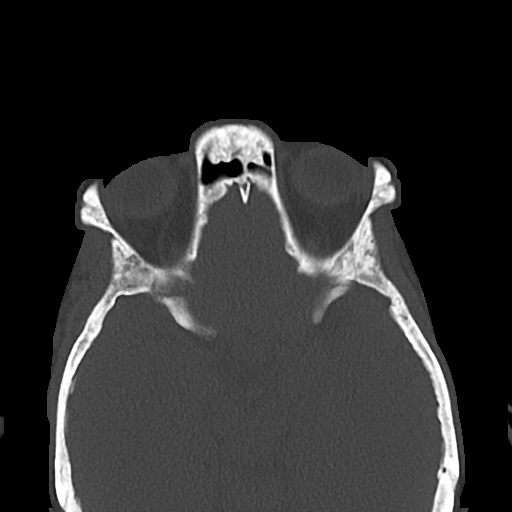

[Series 14: bone cor · coronal · 0.35mm/px · 1 of 73 slices shown]
[im 37/73  bone]
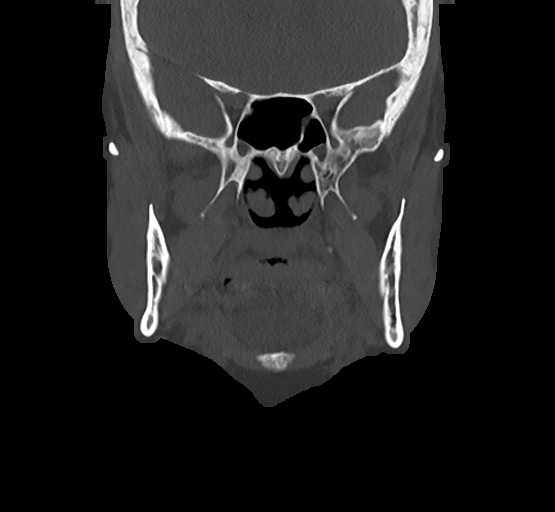

[15 of 47 positions shown; findings below may reference images not displayed]

FINDINGS: CT HEAD FINDINGS

BRAIN: No intraparenchymal hemorrhage, mass effect, midline shift or
acute large vascular territory infarcts. Mild parenchymal brain
volume loss. Patchy to confluent supratentorial white matter
hypodensities similar to prior MRI given differences in imaging
technique. No abnormal extra-axial fluid collections. Basal cisterns
are patent.

VASCULAR: Trace calcific atherosclerosis carotid siphon.

SKULL/SOFT TISSUES: No skull fracture. No significant soft tissue
swelling.

OTHER: None.

CT MAXILLOFACIAL FINDINGS

OSSEOUS: No acute facial fracture. The mandible is intact, the
condyles are located. No destructive bony lesions. LEFT mandible
ramus bone island, potential unremarkable erupted small molar.
Moderate RIGHT temporomandibular osteoarthrosis.

ORBITS: Ocular globes and orbital contents are normal.

SINUSES: Small mucosal retention cysts. No paranasal sinus air-fluid
levels. Mastoid air cells are well aerated. Intact nasal septum is
deviated to the LEFT with moderate bony spur. RIGHT concha bullosa.

SOFT TISSUES: RIGHT face soft tissue swelling and subcutaneous fat
stranding compatible with contusion. No subcutaneous gas or
radiopaque foreign bodies.

CT CERVICAL SPINE FINDINGS

ALIGNMENT: Maintenance of cervical lordosis. Minimal grade 1 C4-5
anterolisthesis.

SKULL BASE AND VERTEBRAE: Cervical vertebral bodies and posterior
elements are intact. Moderate C5-6 with bulky ventral and C6-7 disc
height loss endplate spurring. C2-3 through C5-6 severe facet
arthropathy. Atlantodental interval maintained with severe arthrosis
and contiguous longus coli insertional enthesopathy.

SOFT TISSUES AND SPINAL CANAL: Nonacute. Mild calcific
atherosclerosis LEFT carotid bifurcation.

DISC LEVELS: No significant osseous canal stenosis or neural
foraminal narrowing.

UPPER CHEST: Lung apices are clear.

OTHER: None.
IMPRESSION: CT HEAD:

1. No acute intracranial process.
2. Advanced white matter changes consistent with history of
demyelination, relatively stable given different imaging technique.
3. Mild parenchymal brain volume loss.

CT MAXILLOFACIAL:

1. Mild RIGHT facial soft tissue swelling/contusion. No facial
fracture.

CT CERVICAL SPINE:

1. No fracture or malalignment.
2. Degenerative change of the cervical spine including multilevel
severe facet arthropathy.

## 2020-01-18 ENCOUNTER — Ambulatory Visit: Payer: Medicare Other | Admitting: Neurology

## 2020-01-25 ENCOUNTER — Telehealth: Payer: Self-pay | Admitting: Family Medicine

## 2020-01-25 NOTE — Telephone Encounter (Signed)
Left message for patient to call office to schedule AWV with Nurse Health Advisor.  Last AWV 02/03/19. SF

## 2020-03-16 ENCOUNTER — Other Ambulatory Visit: Payer: Self-pay

## 2020-03-16 DIAGNOSIS — R410 Disorientation, unspecified: Secondary | ICD-10-CM

## 2020-06-10 ENCOUNTER — Telehealth: Payer: Self-pay

## 2020-06-10 NOTE — Telephone Encounter (Signed)
Pt new MAR was signed and faxed back to [facility] on 06/10/2020, confirmation received

## 2020-06-10 NOTE — Telephone Encounter (Signed)
Copy sent to scanning

## 2020-06-29 ENCOUNTER — Telehealth: Payer: Self-pay | Admitting: *Deleted

## 2020-06-29 NOTE — Telephone Encounter (Signed)
Patient needs to proceed to the emergency department immediately.

## 2020-06-29 NOTE — Telephone Encounter (Signed)
Patient sister called the after hours line  on 06/27/2020. Sister reports Stephanie Zhang (patient) has fallen several times. She hit her back on the first fall, the tip of her spine near tailbone, about a week ago. She continues to have tailbone pain/bruising. Believes this occurred last Monday. Patient reports that she has fallen over the weekend also and her neck hurts. Caller reports that she is getting "confused". Reports that she is having some neck stiffness. She doesn't remember hitting her head but she is asking questions about a concussion. States that she feels like her head is full and going to pop. Nurse Triage Tamala Fothergill, RN advised sister that she needs to go to ED.  Sister was also advised that she speak with patient's nurse at the Assisted Living facility and explain that the patient has concerns and is not able to communicate these due to confusion and the triage nurse advised that she be evaluated emergently.   Clinic RN does not see where patient was seen in ED

## 2020-06-30 ENCOUNTER — Telehealth: Payer: Self-pay | Admitting: Family Medicine

## 2020-06-30 NOTE — Telephone Encounter (Signed)
Dawn would like a call to disuses this patient orders  [phone]

## 2020-06-30 NOTE — Telephone Encounter (Signed)
Called pt facility [facility] twice to check on how pt is doing not able to speak with any nurse, left a message with the reception who took the message state that she would pass message to the designated party and have them call our office back. Spoke with pt sister who state that pt did not go to the ED as advised by the Triage Nurse and Dr Volanda Napoleon, pt sister state that pt has no funds left in her bank to pay for the hospital bill, and that pt had an X-ray done at the facility but pt did not get the results for the x-ray, Pt sister Request to call facility and get the x-ray results. Awaiting for pt facility to return my call regarding pt

## 2020-07-04 NOTE — Telephone Encounter (Signed)
Spoke with Rip Harbour with [facility] regarding pt order state that the facility received the orders requested

## 2020-07-12 NOTE — Telephone Encounter (Signed)
Attempted to call pt facility again to find out how pt is doing no option to speak with anyone phone kept ringing with no answer

## 2020-08-30 ENCOUNTER — Telehealth: Payer: Self-pay

## 2020-08-30 NOTE — Telephone Encounter (Signed)
Pt facility [facility] called this morning Stephanie Zhang reported that  pt BP was elevated 142/106, Advised to give pt her am  BP medication and then recheck it after, Reported a reading of 134/93. Offered pt to schedule appointment to see another Dr since Dr Volanda Napoleon is not in the office pt declined.Pt [facility] advised to monitor BP and if elevated to call the office for pt to be seen.

## 2020-09-22 ENCOUNTER — Telehealth: Payer: Self-pay

## 2020-09-22 ENCOUNTER — Other Ambulatory Visit: Payer: Self-pay

## 2020-09-22 DIAGNOSIS — I10 Essential (primary) hypertension: Secondary | ICD-10-CM

## 2020-09-22 DIAGNOSIS — J302 Other seasonal allergic rhinitis: Secondary | ICD-10-CM

## 2020-09-22 MED ORDER — LISINOPRIL 5 MG PO TABS: 5.0000 mg | ORAL_TABLET | Freq: Every day | ORAL | 3 refills | Status: DC

## 2020-09-22 MED ORDER — CALCIUM CARBONATE-VITAMIN D 600-400 MG-UNIT PO TABS: 1.0000 | ORAL_TABLET | Freq: Every day | ORAL | 5 refills | Status: AC

## 2020-09-22 MED ORDER — LORATADINE 10 MG PO TABS: 10.0000 mg | ORAL_TABLET | Freq: Every day | ORAL | 2 refills | Status: DC

## 2020-09-22 MED ORDER — VITAMIN D 50 MCG (2000 UT) PO TABS: ORAL_TABLET | ORAL | 6 refills | Status: DC

## 2020-09-22 NOTE — Telephone Encounter (Signed)
Pt Facility requested new Rx for Lisinopril, Claritin, Calcium and Vit D. Fax sent back to pt facility with the new prescriptions attatched

## 2021-02-22 ENCOUNTER — Telehealth: Payer: Self-pay | Admitting: Family Medicine

## 2021-02-22 NOTE — Telephone Encounter (Signed)
error 

## 2021-02-23 ENCOUNTER — Ambulatory Visit (INDEPENDENT_AMBULATORY_CARE_PROVIDER_SITE_OTHER): Payer: Medicare HMO | Admitting: Family Medicine

## 2021-02-23 ENCOUNTER — Other Ambulatory Visit: Payer: Self-pay

## 2021-02-23 ENCOUNTER — Encounter: Payer: Self-pay | Admitting: Family Medicine

## 2021-02-23 VITALS — BP 128/68 | HR 95 | Temp 98.6°F | Wt 148.8 lb

## 2021-02-23 DIAGNOSIS — M810 Age-related osteoporosis without current pathological fracture: Secondary | ICD-10-CM | POA: Diagnosis not present

## 2021-02-23 DIAGNOSIS — Z029 Encounter for administrative examinations, unspecified: Secondary | ICD-10-CM | POA: Diagnosis not present

## 2021-02-23 DIAGNOSIS — Z853 Personal history of malignant neoplasm of breast: Secondary | ICD-10-CM | POA: Diagnosis not present

## 2021-02-23 DIAGNOSIS — I10 Essential (primary) hypertension: Secondary | ICD-10-CM

## 2021-02-23 DIAGNOSIS — G35 Multiple sclerosis: Secondary | ICD-10-CM

## 2021-02-23 DIAGNOSIS — G35D Multiple sclerosis, unspecified: Secondary | ICD-10-CM

## 2021-02-23 DIAGNOSIS — R410 Disorientation, unspecified: Secondary | ICD-10-CM

## 2021-02-23 LAB — POCT URINALYSIS DIPSTICK
Bilirubin, UA: NEGATIVE
Blood, UA: NEGATIVE
Glucose, UA: NEGATIVE
Ketones, UA: NEGATIVE
Protein, UA: NEGATIVE
Spec Grav, UA: 1.025 (ref 1.010–1.025)
Urobilinogen, UA: 0.2 E.U./dL
pH, UA: 6 (ref 5.0–8.0)

## 2021-02-23 NOTE — Patient Instructions (Signed)
Managing Your Hypertension Hypertension, also called high blood pressure, is when the force of the blood pressing against the walls of the arteries is too strong. Arteries are blood vessels that carry blood from your heart throughout your body. Hypertension forces the heart to work harder to pump blood and may cause the arteries to become narrow or stiff. Understanding blood pressure readings Your personal target blood pressure may vary depending on your medical conditions, your age, and other factors. A blood pressure reading includes a higher number over a lower number. Ideally, your blood pressure should be below 120/80. You should know that:  The first, or top, number is called the systolic pressure. It is a measure of the pressure in your arteries as your heart beats.  The second, or bottom number, is called the diastolic pressure. It is a measure of the pressure in your arteries as the heart relaxes. Blood pressure is classified into four stages. Based on your blood pressure reading, your health care provider may use the following stages to determine what type of treatment you need, if any. Systolic pressure and diastolic pressure are measured in a unit called mmHg. Normal  Systolic pressure: below 120.  Diastolic pressure: below 80. Elevated  Systolic pressure: 120-129.  Diastolic pressure: below 80. Hypertension stage 1  Systolic pressure: 130-139.  Diastolic pressure: 80-89. Hypertension stage 2  Systolic pressure: 140 or above.  Diastolic pressure: 90 or above. How can this condition affect me? Managing your hypertension is an important responsibility. Over time, hypertension can damage the arteries and decrease blood flow to important parts of the body, including the brain, heart, and kidneys. Having untreated or uncontrolled hypertension can lead to:  A heart attack.  A stroke.  A weakened blood vessel (aneurysm).  Heart failure.  Kidney damage.  Eye  damage.  Metabolic syndrome.  Memory and concentration problems.  Vascular dementia. What actions can I take to manage this condition? Hypertension can be managed by making lifestyle changes and possibly by taking medicines. Your health care provider will help you make a plan to bring your blood pressure within a normal range. Nutrition  Eat a diet that is high in fiber and potassium, and low in salt (sodium), added sugar, and fat. An example eating plan is called the Dietary Approaches to Stop Hypertension (DASH) diet. To eat this way: ? Eat plenty of fresh fruits and vegetables. Try to fill one-half of your plate at each meal with fruits and vegetables. ? Eat whole grains, such as whole-wheat pasta, brown rice, or whole-grain bread. Fill about one-fourth of your plate with whole grains. ? Eat low-fat dairy products. ? Avoid fatty cuts of meat, processed or cured meats, and poultry with skin. Fill about one-fourth of your plate with lean proteins such as fish, chicken without skin, beans, eggs, and tofu. ? Avoid pre-made and processed foods. These tend to be higher in sodium, added sugar, and fat.  Reduce your daily sodium intake. Most people with hypertension should eat less than 1,500 mg of sodium a day.   Lifestyle  Work with your health care provider to maintain a healthy body weight or to lose weight. Ask what an ideal weight is for you.  Get at least 30 minutes of exercise that causes your heart to beat faster (aerobic exercise) most days of the week. Activities may include walking, swimming, or biking.  Include exercise to strengthen your muscles (resistance exercise), such as weight lifting, as part of your weekly exercise routine. Try   to do these types of exercises for 30 minutes at least 3 days a week.  Do not use any products that contain nicotine or tobacco, such as cigarettes, e-cigarettes, and chewing tobacco. If you need help quitting, ask your health care  provider.  Control any long-term (chronic) conditions you have, such as high cholesterol or diabetes.  Identify your sources of stress and find ways to manage stress. This may include meditation, deep breathing, or making time for fun activities.   Alcohol use  Do not drink alcohol if: ? Your health care provider tells you not to drink. ? You are pregnant, may be pregnant, or are planning to become pregnant.  If you drink alcohol: ? Limit how much you use to:  0-1 drink a day for women.  0-2 drinks a day for men. ? Be aware of how much alcohol is in your drink. In the [place] one drink equals one 12 oz bottle of beer (355 mL), one 5 oz glass of wine (148 mL), or one 1 oz glass of hard liquor (44 mL). Medicines Your health care provider may prescribe medicine if lifestyle changes are not enough to get your blood pressure under control and if:  Your systolic blood pressure is 130 or higher.  Your diastolic blood pressure is 80 or higher. Take medicines only as told by your health care provider. Follow the directions carefully. Blood pressure medicines must be taken as told by your health care provider. The medicine does not work as well when you skip doses. Skipping doses also puts you at risk for problems. Monitoring Before you monitor your blood pressure:  Do not smoke, drink caffeinated beverages, or exercise within 30 minutes before taking a measurement.  Use the bathroom and empty your bladder (urinate).  Sit quietly for at least 5 minutes before taking measurements. Monitor your blood pressure at home as told by your health care provider. To do this:  Sit with your back straight and supported.  Place your feet flat on the floor. Do not cross your legs.  Support your arm on a flat surface, such as a table. Make sure your upper arm is at heart level.  Each time you measure, take two or three readings one minute apart and record the results. You may also need to have your  blood pressure checked regularly by your health care provider.   General information  Talk with your health care provider about your diet, exercise habits, and other lifestyle factors that may be contributing to hypertension.  Review all the medicines you take with your health care provider because there may be side effects or interactions.  Keep all visits as told by your health care provider. Your health care provider can help you create and adjust your plan for managing your high blood pressure. Where to find more information  National Heart, Lung, and Blood Institute: www.nhlbi.nih.gov  American Heart Association: www.heart.org Contact a health care provider if:  You think you are having a reaction to medicines you have taken.  You have repeated (recurrent) headaches.  You feel dizzy.  You have swelling in your ankles.  You have trouble with your vision. Get help right away if:  You develop a severe headache or confusion.  You have unusual weakness or numbness, or you feel faint.  You have severe pain in your chest or abdomen.  You vomit repeatedly.  You have trouble breathing. These symptoms may represent a serious problem that is an emergency. Do not wait   to see if the symptoms will go away. Get medical help right away. Call your local emergency services (911 in the [place] Do not drive yourself to the hospital. Summary  Hypertension is when the force of blood pumping through your arteries is too strong. If this condition is not controlled, it may put you at risk for serious complications.  Your personal target blood pressure may vary depending on your medical conditions, your age, and other factors. For most people, a normal blood pressure is less than 120/80.  Hypertension is managed by lifestyle changes, medicines, or both.  Lifestyle changes to help manage hypertension include losing weight, eating a healthy, low-sodium diet, exercising more, stopping smoking, and  limiting alcohol. This information is not intended to replace advice given to you by your health care provider. Make sure you discuss any questions you have with your health care provider. Document Revised: 01/01/2020 Document Reviewed: 10/27/2019 Elsevier Patient Education  2021 [place].  Multiple Sclerosis Multiple sclerosis (MS) is a disease of the brain, spinal cord, and optic nerves (central nervous system). It causes the body's disease-fighting (immune) system to destroy the protective covering (myelin sheath) around nerves in the brain. When this happens, signals (nerve impulses) going to and from the brain and spinal cord do not get sent properly or may not get sent at all. There are several types of MS:  Relapsing-remitting MS. This is the most common type. This causes sudden attacks of symptoms. After an attack, you may recover completely until the next attack, or some symptoms may remain permanently.  Secondary progressive MS. This usually develops after the onset of relapsing-remitting MS. Similar to relapsing-remitting MS, this type also causes sudden attacks of symptoms. Attacks may be less frequent, but symptoms slowly get worse (progress) over time.  Primary progressive MS. This causes symptoms that steadily progress over time. This type of MS does not cause sudden attacks of symptoms. The age of onset of MS varies, but it often develops between 17-27 years of age. MS is a lifelong (chronic) condition. There is no cure, but treatment can help slow down the progression of the disease. What are the causes? The cause of this condition is not known. What increases the risk? You are more likely to develop this condition if:  You are a woman.  You have a relative with MS. However, the condition is not passed from parent to child (inherited).  You have a lack (deficiency) of vitamin D.  You smoke. MS is more common in the [place] than in the [place] the [place]. What are the signs or symptoms? Relapsing-remitting and secondary progressive MS cause symptoms to occur in episodes or attacks that may last weeks to months. There may be long periods between attacks in which there are almost no symptoms. Primary progressive MS causes symptoms to steadily progress after they develop. Symptoms of MS vary because of the many different ways it affects the central nervous system. The main symptoms include:  Vision problems and eye pain.  Numbness and weakness.  Inability to move your arms, hands, feet, or legs (paralysis).  Balance problems.  Shaking that you cannot control (tremors).  Muscle spasms.  Problems with thinking (cognitive changes). MS can also cause symptoms that are associated with the disease, but are not always the direct result of an MS attack. They may include:  Inability to control urination or bowel movements (incontinence).  Headaches.  Fatigue.  Inability to tolerate heat.  Emotional changes.  Depression.  Pain. How is this diagnosed? This condition is diagnosed based on:  Your symptoms.  A neurological exam. This involves checking central nervous system function, such as nerve function, reflexes, and coordination.  MRIs of the brain and spinal cord.  Lab tests, including a lumbar puncture that tests the fluid that surrounds the brain and spinal cord (cerebrospinal fluid).  Tests to measure the electrical activity of the brain in response to stimulation (evoked potentials). How is this treated? There is no cure for MS, but medicines can help decrease the number and frequency of attacks and help relieve nuisance symptoms. Treatment options may include:  Medicines that reduce the frequency of attacks. These medicines may be given by injection, by mouth (orally), or through an IV.  Medicines that reduce inflammation (steroids). These may provide short-term relief of symptoms.  Medicines to help control pain,  depression, fatigue, or incontinence.  Nutritional counseling. Vitamin D supplements, if you have a deficiency.  Using devices to help you move around (assistive devices), such as braces, a cane, or a walker.  Physical therapy to strengthen and stretch your muscles.  Occupational therapy to help you with everyday tasks.  Alternative or complementary treatments such as exercise, massage, or acupuncture.   Follow these instructions at home:  Take over-the-counter and prescription medicines only as told by your health care provider.  Do not drive or use heavy machinery while taking prescription pain medicine.  Use assistive devices as recommended by your physical therapist or your health care provider.  Exercise as directed by your health care provider.  Eating healthy can help manage MS symptoms.  Return to your normal activities as told by your health care provider. Ask your health care provider what activities are safe for you.  Reach out for support. Share your feelings with friends, family, or a support group.  Keep all follow-up visits as told by your health care provider and therapists. This is important. Where to find more information  National Multiple Sclerosis Society: https://www.nationalmssociety.[place] [facility] [employer] [facility] Stroke: http://hendricks-barton.net/  [employer]: GourmetRating.dk Contact a health care provider if:  You feel depressed.  You develop new pain or numbness.  You have tremors.  You have problems with sexual function. Get help right away if:  You develop paralysis.  You develop numbness.  You have problems with your bladder or bowel function.  You develop double vision.  You lose vision in one or both eyes.  You develop suicidal thoughts.  You develop severe confusion. If you ever feel like you may hurt yourself or others, or have thoughts about  taking your own life, get help right away. You can go to your nearest emergency department or call:  Your local emergency services (911 in the [place]  A suicide crisis helpline, such as the [facility] at 938-321-9060. This is open 24 hours a day. Summary  Multiple sclerosis (MS) is a disease of the central nervous system that causes the body's immune system to destroy the protective covering (myelin sheath) around nerves in the brain.  There are 3 types of MS: relapsing-remitting, secondary progressive, and primary progressive. Relapsing-remitting and secondary progressive MS cause symptoms to occur in episodes or attacks that may last weeks to months. Primary progressive MS causes symptoms to steadily progress after they develop.  There is no cure for MS, but medicines can help decrease the number and frequency of attacks and help relieve nuisance symptoms. Treatment may also  include physical or occupational therapy.  If you develop numbness, paralysis, vision problems, or other neurological symptoms, get help right away. This information is not intended to replace advice given to you by your health care provider. Make sure you discuss any questions you have with your health care provider. Document Revised: 09/06/2020 Document Reviewed: 09/06/2020 Elsevier Patient Education  2021 [employer].

## 2021-02-24 LAB — TSH: TSH: 2.06 u[IU]/mL (ref 0.35–4.50)

## 2021-02-24 LAB — CBC WITH DIFFERENTIAL/PLATELET
Basophils Absolute: 0.1 10*3/uL (ref 0.0–0.1)
Basophils Relative: 0.9 % (ref 0.0–3.0)
Eosinophils Absolute: 0.1 10*3/uL (ref 0.0–0.7)
Eosinophils Relative: 1.9 % (ref 0.0–5.0)
HCT: 42.3 % (ref 36.0–46.0)
Hemoglobin: 14.2 g/dL (ref 12.0–15.0)
Lymphocytes Relative: 21.4 % (ref 12.0–46.0)
Lymphs Abs: 1.6 10*3/uL (ref 0.7–4.0)
MCHC: 33.6 g/dL (ref 30.0–36.0)
MCV: 92.1 fl (ref 78.0–100.0)
Monocytes Absolute: 0.7 10*3/uL (ref 0.1–1.0)
Monocytes Relative: 9.5 % (ref 3.0–12.0)
Neutro Abs: 4.9 10*3/uL (ref 1.4–7.7)
Neutrophils Relative %: 66.3 % (ref 43.0–77.0)
Platelets: 227 10*3/uL (ref 150.0–400.0)
RBC: 4.59 Mil/uL (ref 3.87–5.11)
RDW: 13 % (ref 11.5–15.5)
WBC: 7.3 10*3/uL (ref 4.0–10.5)

## 2021-02-24 LAB — LIPID PANEL
Cholesterol: 195 mg/dL (ref 0–200)
HDL: 66.9 mg/dL (ref >39.00)
LDL Cholesterol: 98 mg/dL (ref 0–99)
NonHDL: 128.36
Total CHOL/HDL Ratio: 3
Triglycerides: 152 mg/dL — ABNORMAL HIGH (ref 0.0–149.0)
VLDL: 30.4 mg/dL (ref 0.0–40.0)

## 2021-02-24 LAB — BASIC METABOLIC PANEL
BUN: 18 mg/dL (ref 6–23)
CO2: 27 mEq/L (ref 19–32)
Calcium: 9.6 mg/dL (ref 8.4–10.5)
Chloride: 104 mEq/L (ref 96–112)
Creatinine, Ser: 0.89 mg/dL (ref 0.40–1.20)
GFR: 71.04 mL/min (ref >60.00)
Glucose, Bld: 90 mg/dL (ref 70–99)
Potassium: 3.9 mEq/L (ref 3.5–5.1)
Sodium: 140 mEq/L (ref 135–145)

## 2021-02-24 LAB — HEMOGLOBIN A1C: Hgb A1c MFr Bld: 5.7 % (ref 4.6–6.5)

## 2021-02-24 LAB — VITAMIN D 25 HYDROXY (VIT D DEFICIENCY, FRACTURES): VITD: 43.23 ng/mL (ref 30.00–100.00)

## 2021-02-24 LAB — VITAMIN B12: Vitamin B-12: 179 pg/mL — ABNORMAL LOW (ref 211–911)

## 2021-02-24 LAB — T4, FREE: Free T4: 1.04 ng/dL (ref 0.60–1.60)

## 2021-02-25 LAB — URINE CULTURE
MICRO NUMBER:: 11659958
SPECIMEN QUALITY:: ADEQUATE

## 2021-02-27 ENCOUNTER — Other Ambulatory Visit: Payer: Self-pay | Admitting: Family Medicine

## 2021-02-27 DIAGNOSIS — N3000 Acute cystitis without hematuria: Secondary | ICD-10-CM

## 2021-02-27 MED ORDER — AMOXICILLIN-POT CLAVULANATE 500-125 MG PO TABS: 1.0000 | ORAL_TABLET | Freq: Two times a day (BID) | ORAL | 0 refills | Status: AC

## 2021-02-28 NOTE — Progress Notes (Signed)
Called patient to go over lab results, no answer and no voicemail set up.

## 2021-03-02 NOTE — Progress Notes (Signed)
Called patient x2 no answer, no voicemail.

## 2021-03-08 ENCOUNTER — Telehealth: Payer: Self-pay | Admitting: Family Medicine

## 2021-03-08 NOTE — Telephone Encounter (Signed)
Lorelei Pont is the patient's brother and he was calling to see if Dr. Volanda Napoleon has completed the letter for the patient for  An[employer]ity with [employer] that show that she is disabled.  Please advise

## 2021-03-12 ENCOUNTER — Encounter: Payer: Self-pay | Admitting: Family Medicine

## 2021-03-12 NOTE — Progress Notes (Signed)
Subjective:    Patient ID: Stephanie Zhang, female    DOB: Apr 17, 1961, 60 y.o.   MRN: [id number]  No chief complaint on file. Pt is accompanied by her brother, [name], and her sister.  HPI Patient is a 60 yo female with pmh sig for MS, sarcoidosis, depression, anxiety, h/o breast cancer, and chronic back pain was seen today for f/u.  Since last OFV, pt notes her sister Judson Roch died from cancer.  Pt is dealing with the loss.  She tries to keep busy with activities at [employer].  Pt's sister was her payee for benefits she received after the death of her husband, in 2011/03/03, who worked for [employer].  Pt's brother has info regarding assigning a new payee for patient.  Per pt's family she seems more confused.  Pt mentions she gained weight.  Past Medical History:  Diagnosis Date  . Anxiety   . Blood transfusion    "when I was born"  . Breast cancer ([place]) 10/2001   Stage II (right side)  . Chronic back pain   . Confusion   . Depression   . Gastroparesis   . GERD (gastroesophageal reflux disease)   . Headache(784.0)    "lots"  . Hypoglycemia   . MS (multiple sclerosis) ([place]) 1987  . Multiple sclerosis   . Osteoporosis   . Short-term memory loss     Allergies  Allergen Reactions  . Amantadines   . Sulfa Antibiotics Itching and Rash  . Sulfacetamide Sodium Itching and Rash    ROS General: Denies fever, chills, night sweats, changes in weight, changes in appetite  +weight gain, confusion HEENT: Denies headaches, ear pain, changes in vision, rhinorrhea, sore throat CV: Denies CP, palpitations, SOB, orthopnea Pulm: Denies SOB, cough, wheezing GI: Denies abdominal pain, nausea, vomiting, diarrhea, constipation GU: Denies dysuria, hematuria, frequency, vaginal discharge Msk: Denies muscle cramps, joint pains Neuro: Denies weakness, numbness, tingling Skin: Denies rashes, bruising Psych: Denies depression, anxiety, hallucinations     Objective:    Blood pressure 128/68, pulse  95, temperature 98.6 F (37 C), temperature source Oral, weight 148 lb 12.8 oz (67.5 kg), last menstrual period 08/09/2008, SpO2 96 %.  Gen. Pleasant, well-nourished, in no distress, normal affect  HEENT: [place]/AT, face symmetric, conjunctiva clear, no scleral icterus, PERRLA, EOMI, nares patent without drainage Lungs: no accessory muscle use, CTAB, no wheezes or rales Cardiovascular: RRR, no m/r/g, no peripheral edema Abdomen: BS present, soft, NT/ND. Musculoskeletal: No deformities, no cyanosis or clubbing, normal tone Neuro:  A&Ox3, CN II-XII intact, unsteady gait, using walker to aid with ambulation Skin:  Warm, no lesions/ rash  Wt Readings from Last 3 Encounters:  02/23/21 148 lb 12.8 oz (67.5 kg)  09/18/19 150 lb (68 kg)  04/15/19 145 lb 12.8 oz (66.1 kg)    Lab Results  Component Value Date   WBC 7.3 02/23/2021   HGB 14.2 02/23/2021   HCT 42.3 02/23/2021   PLT 227.0 02/23/2021   GLUCOSE 90 02/23/2021   CHOL 195 02/23/2021   TRIG 152.0 (H) 02/23/2021   HDL 66.90 02/23/2021   LDLCALC 98 02/23/2021   ALT 12 10/17/2017   AST 16 10/17/2017   NA 140 02/23/2021   K 3.9 02/23/2021   CL 104 02/23/2021   CREATININE 0.89 02/23/2021   BUN 18 02/23/2021   CO2 27 02/23/2021   TSH 2.06 02/23/2021   HGBA1C 5.7 02/23/2021    Assessment/Plan:  Essential hypertension  -controlled -continue lisinopril 5 mg  - Plan:  Basic metabolic panel, Lipid panel, POCT urinalysis dipstick  MS (multiple sclerosis) ([place])  -discussed referral for new Neurologist -continue using walker to aid with ambulation - Plan: CBC with Differential/Platelet, TSH, T4, Free, Hemoglobin A1c, Vitamin B12, POCT urinalysis dipstick, Referral to Neurology  Administrative encounter -family requesting letter for pt's brother [name] ([phone]) to become new payee. -will review records and complete if appropriate.  Family will pick up letter from office once completed.  BREAST CANCER, HX OF -in  remission -continue to monitor  Osteoporosis without current pathological fracture, unspecified osteoporosis type  -weight bearing exercises -bone density scan advised. - Plan: Vitamin D, 25-hydroxy  Confusion -discussed possible causes including progression of MS, UTI, electrolyte deficiencies. -will obtain labs -consider MRI brain and spine to re-evaluate MS - Plan: Urine Culture  F/u in 4-6 months  Grier Mitts, MD

## 2021-03-13 ENCOUNTER — Encounter: Payer: Self-pay | Admitting: Neurology

## 2021-03-13 ENCOUNTER — Encounter: Payer: Self-pay | Admitting: Family Medicine

## 2021-03-13 ENCOUNTER — Telehealth (INDEPENDENT_AMBULATORY_CARE_PROVIDER_SITE_OTHER): Payer: 59 | Admitting: Family Medicine

## 2021-03-13 VITALS — Temp 97.7°F

## 2021-03-13 DIAGNOSIS — J069 Acute upper respiratory infection, unspecified: Secondary | ICD-10-CM | POA: Diagnosis not present

## 2021-03-13 DIAGNOSIS — R0982 Postnasal drip: Secondary | ICD-10-CM

## 2021-03-13 DIAGNOSIS — J040 Acute laryngitis: Secondary | ICD-10-CM

## 2021-03-13 DIAGNOSIS — J3489 Other specified disorders of nose and nasal sinuses: Secondary | ICD-10-CM | POA: Diagnosis not present

## 2021-03-13 MED ORDER — FLUTICASONE PROPIONATE 50 MCG/ACT NA SUSP
1.0000 | Freq: Every day | NASAL | 0 refills | Status: DC
Start: 2021-03-13 — End: 2021-05-12

## 2021-03-13 NOTE — Telephone Encounter (Signed)
Spoke with Stephanie Zhang and informed him the letter is at the front desk.

## 2021-03-13 NOTE — Progress Notes (Signed)
Virtual Visit via Telephone Note  I connected with Stephanie Zhang on 03/13/21 at  3:00 PM EDT by telephone and verified that I am speaking with the correct person using two identifiers.   I discussed the limitations, risks, security and privacy concerns of performing an evaluation and management service by telephone and the availability of in person appointments. I also discussed with the patient that there may be a patient responsible charge related to this service. The patient expressed understanding and agreed to proceed.  Location patient: home Location provider: work or home office Participants present for the call: patient, provider.  Pt's brother [name] present Patient did not have a visit in the prior 7 days to address this/these issue(s).   History of Present Illness: Pt with hoarse voice x 3-4 days ago.  Unsure if it is related to the pollen.  Pt denies sore throat.  Pt notes a dry cough, rhinorrhea, ear pain, HA.  Denies fever. Taking tylenol.     Observations/Objective: Patient sounds cheerful and well on the phone. I do not appreciate any SOB. Speech and thought processing are grossly intact. Patient reported vitals:  Assessment and Plan: Viral URI with cough  -supportive care  -COVID testing advised.  Coordinate with facility - Plan: fluticasone (FLONASE) 50 MCG/ACT nasal spray  Laryngitis -vocal rest  Rhinorrhea  - Plan: fluticasone (FLONASE) 50 MCG/ACT nasal spray  Post-nasal drainage  - Plan: fluticasone (FLONASE) 50 MCG/ACT nasal spray   Follow Up Instructions:  F/u prn for continued or worsened symptoms  I did not refer this patient for an OV in the next 24 hours for this/these issue(s).  I discussed the assessment and treatment plan with the patient. The patient was provided an opportunity to ask questions and all were answered. The patient agreed with the plan and demonstrated an understanding of the instructions.   The patient was advised to  call back or seek an in-person evaluation if the symptoms worsen or if the condition fails to improve as anticipated.  I provided 11 minutes of non-face-to-face time during this encounter.   [name], MD

## 2021-03-13 NOTE — Progress Notes (Signed)
Spoke with patient, aware of results.

## 2021-03-19 ENCOUNTER — Encounter: Payer: Self-pay | Admitting: Family Medicine

## 2021-05-12 ENCOUNTER — Other Ambulatory Visit: Payer: Self-pay

## 2021-05-12 DIAGNOSIS — J069 Acute upper respiratory infection, unspecified: Secondary | ICD-10-CM

## 2021-05-12 DIAGNOSIS — R0982 Postnasal drip: Secondary | ICD-10-CM

## 2021-05-12 DIAGNOSIS — J3489 Other specified disorders of nose and nasal sinuses: Secondary | ICD-10-CM

## 2021-05-12 MED ORDER — FLUTICASONE PROPIONATE 50 MCG/ACT NA SUSP: 1.0000 | Freq: Every day | NASAL | 0 refills | Status: DC

## 2021-05-23 ENCOUNTER — Other Ambulatory Visit: Payer: Self-pay

## 2021-05-23 DIAGNOSIS — R0982 Postnasal drip: Secondary | ICD-10-CM

## 2021-05-23 DIAGNOSIS — J069 Acute upper respiratory infection, unspecified: Secondary | ICD-10-CM

## 2021-05-23 DIAGNOSIS — J3489 Other specified disorders of nose and nasal sinuses: Secondary | ICD-10-CM

## 2021-05-23 MED ORDER — FLUTICASONE PROPIONATE 50 MCG/ACT NA SUSP: 1.0000 | Freq: Every day | NASAL | 1 refills | Status: DC

## 2021-09-04 NOTE — Progress Notes (Signed)
NEUROLOGY FOLLOW UP OFFICE NOTE  Stephanie Zhang [id number]  Assessment/Plan:   Secondary progressive multiple sclerosis Cerebral meningioma B12 deficiency Major neurocognitive disorder due to MS  DMT:  Not indicated in inactive secondary progressive MS Check B12 and vit D level Follow up in one year   Subjective:  Stephanie Zhang is a 60 year old female with chronic back pain, osteoporosis, GERD, thrombocytopenia and history of breast cancer who follows up for multiple sclerosis.   UPDATE: Current DMT:  None Other medications: Bupropion 75 mg daily for depression, D3 2000 IU daily   Last seen in May 2020.  As per patient and her brother, no significant decline.   Vision: Occasional blurry vision but not recently Motor: No issues Sensory: No issues Pain: No issues Gait: Able to ambulate with walker.  She walks around the building at least 3 times a day. Bowel/Bladder: Stress incontinence.   Fatigue: Yes Cognition: Short-term memory deficits.  Does not remember certain family members are dead.  No recent visual hallucinations such as involving animals.  Brother manages her finances and is her financial POA Mood: She is short-tempered.     Labs from 02/23/2021:  vit D 25-hydroxy 43.23; TSH 2.06; B12 179.  She was supposed to start B12 injections however she says she never started it.     HISTORY: She was diagnosed with multiple sclerosis at around age 7.  She reports left sided numbness, ataxia, and visual disturbance.  She had bilateral INO.  She did undergo a lumbar puncture at the time.[name]   She was initially on Avonex, which was switched to Rebif in 2004 due to change in insurance.  Rebif was subsequently denied due to diagnosis of SPMS.   She has required treatment of flare-ups with Solu-Medrol, usually presenting as worsening gait with falls.  As per prior neurologist's notes, her last steroid treatment was in 2010.  She has had a progressive physical decline since then.    Her ex-husband passed away on 01/28/17.  Since then, she reports visual and auditory hallucinations.  In the middle of the night, she may see and hear her ex-husband outside the window.  On at least one occasion, she woke up in the middle of the night and heard knocking on her window.  She saw somebody outside speaking [place].  She told the nurses and they didn't see anyone.  One afternoon, she was walking in the courtyard and saw a [place].  Her friend didn't see anything.  Later, when she was in the dining room, she saw the [place] outside the window.  Sometimes, these hallucinations are frighthening.  They occur 2 to 3 times a week.   She currently lives in assisted living at [facility].  At baseline, she exhibits extra-pyramidal symptoms such as tremor.  She has memory deficits.  She requires use of a walker since 2005.  She has neurogenic bladder.  She needs to push down on her stomach in order to help void.  She sometimes has constipation or diarrhea.   MRI of brain with and without contrast from 09/08/16 reports "no change in the appearance of numerous subcortical, periventricular, and deep white matter T2 hyperintense lesions" when compared to prior imaging from 10/23/08.  However, it also reports mild interval enlargement of a meningioma "in the left frontal dura which measures 5 x 11 mm, previously 2 x 10 mm."  She was referred to neurosurgery for evaluation of meningioma.  Continued monitoring was recommended.  MRI of brain with and without  contrast from 10/22/17 was stable compared to prior study from 09/08/16 with no evidence of progression or contrast enhancing lesions.  Left frontal convexity meningioma is stable at 8 by 7 mm.  MRI of brain with and without contrast from 10/07/18 was stable compared to prior MRI from 10/22/17.    PAST MEDICAL HISTORY: Past Medical History:  Diagnosis Date   Anxiety    Blood transfusion    "when I was born"   Breast cancer ([place]) 10/2001   Stage II (right  side)   Chronic back pain    Confusion    Depression    Gastroparesis    GERD (gastroesophageal reflux disease)    Headache(784.0)    "lots"   Hypoglycemia    MS (multiple sclerosis) ([place]) 1987   Multiple sclerosis    Osteoporosis    Short-term memory loss     MEDICATIONS: Current Outpatient Medications on File Prior to Visit  Medication Sig Dispense Refill   acetaminophen (TYLENOL) 500 MG tablet Take 1,000 mg by mouth every 6 (six) hours as needed for mild pain.     buPROPion (WELLBUTRIN) 75 MG tablet Take 2 tablets (150 mg total) by mouth daily. 60 tablet 5   Calcium Carbonate-Vitamin D 600-400 MG-UNIT tablet Take 1 tablet by mouth daily. 30 tablet 5   Cholecalciferol (VITAMIN D) 50 MCG (2000 UT) tablet Take 1 TAB BY MOUTH EVERY DAY 30 tablet 6   cyclobenzaprine (FLEXERIL) 5 MG tablet Take 1 tablet (5 mg total) by mouth 2 (two) times daily as needed for muscle spasms. (Patient not taking: No sig reported) 6 tablet 0   docusate sodium (COLACE) 100 MG capsule Take 1 capsule (100 mg total) by mouth 2 (two) times daily. 180 capsule 1   fluticasone (FLONASE) 50 MCG/ACT nasal spray Place 1 spray into both nostrils daily. 16 g 1   guaiFENesin (ROBITUSSIN) 100 MG/5ML liquid Take 200 mg by mouth every 6 (six) hours as needed for cough. (Patient not taking: No sig reported)     hydrocortisone cream 1 % Apply twice daily to affected areas (Patient not taking: No sig reported) 45 g 0   ibuprofen (ADVIL) 600 MG tablet Take 1 tablet (600 mg total) by mouth every 6 (six) hours as needed for moderate pain. (Patient not taking: No sig reported) 30 tablet 0   lisinopril (ZESTRIL) 5 MG tablet Take 1 tablet (5 mg total) by mouth daily. 30 tablet 3   loperamide (IMODIUM) 2 MG capsule Take 2 mg by mouth every 4 (four) hours as needed for diarrhea or loose stools.  (Patient not taking: No sig reported)     loratadine (CLARITIN) 10 MG tablet Take 1 tablet (10 mg total) by mouth daily. 30 tablet 2    polyethylene glycol (MIRALAX / GLYCOLAX) packet Take 17 g by mouth daily. 100 each 11   promethazine (PHENERGAN) 25 MG tablet Take 25 mg by mouth every 6 (six) hours as needed for nausea or vomiting. (Patient not taking: No sig reported)     senna (SENOKOT) 8.6 MG tablet Take by mouth. (Patient not taking: No sig reported)     Skin Protectants, Misc. (EUCERIN) cream Apply 1 application topically 2 (two) times daily as needed for dry skin. 454 g 0   No current facility-administered medications on file prior to visit.    ALLERGIES: Allergies  Allergen Reactions   Amantadines    Sulfa Antibiotics Itching and Rash   Sulfacetamide Sodium Itching and Rash    FAMILY  HISTORY: Family History  Problem Relation Age of Onset   Diabetes Mother    Cancer Mother        cervical cancer   Heart disease Mother    Diabetes Father       Objective:  Blood pressure 108/71, pulse 90, height 5\' 4"  (1.626 m), weight 145 lb 3.2 oz (65.9 kg), last menstrual period 08/09/2008, SpO2 98 %. General: No acute distress.  Patient appears well-groomed.   Head:  Normocephalic/atraumatic Eyes:  Fundi examined but not visualized Neck: supple, no paraspinal tenderness, full range of motion Heart:  Regular rate and rhythm Lungs:  Clear to auscultation bilaterally Back: No paraspinal tenderness Neurological Exam: alert and oriented to person, place, and time.  Speech fluent and not dysarthric, language intact.  Bilateral internuclear ophthalmoplegia.  Otherwise, CN II-XII intact. Bulk and tone normal, muscle strength 5-/5 left upper extremity, otherwise 5/5 throughout.  Sensation to light touch intact.  Deep tendon reflexes 2+ throughout, toes upgoing.  Finger-to-nose testing with bilateral tremor and dysmetria, heel-to-shin testing with dysmetria.  Spastic ataxic gait.  Unable to ambulate without assistance.  [place] not tested.   Metta Clines, DO  CC: Grier Mitts, MD

## 2021-09-05 ENCOUNTER — Other Ambulatory Visit (INDEPENDENT_AMBULATORY_CARE_PROVIDER_SITE_OTHER): Payer: 59

## 2021-09-05 ENCOUNTER — Other Ambulatory Visit: Payer: Self-pay

## 2021-09-05 ENCOUNTER — Ambulatory Visit (INDEPENDENT_AMBULATORY_CARE_PROVIDER_SITE_OTHER): Payer: 59 | Admitting: Neurology

## 2021-09-05 ENCOUNTER — Encounter: Payer: Self-pay | Admitting: Neurology

## 2021-09-05 VITALS — BP 108/71 | HR 90 | Ht 64.0 in | Wt 145.2 lb

## 2021-09-05 DIAGNOSIS — R4189 Other symptoms and signs involving cognitive functions and awareness: Secondary | ICD-10-CM | POA: Diagnosis not present

## 2021-09-05 DIAGNOSIS — D320 Benign neoplasm of cerebral meninges: Secondary | ICD-10-CM

## 2021-09-05 DIAGNOSIS — G35 Multiple sclerosis: Secondary | ICD-10-CM

## 2021-09-05 DIAGNOSIS — E538 Deficiency of other specified B group vitamins: Secondary | ICD-10-CM

## 2021-09-05 LAB — VITAMIN B12: Vitamin B-12: 214 pg/mL (ref 211–911)

## 2021-09-05 LAB — VITAMIN D 25 HYDROXY (VIT D DEFICIENCY, FRACTURES): VITD: 43.08 ng/mL (ref 30.00–100.00)

## 2021-09-05 NOTE — Patient Instructions (Signed)
Check vitamin B12 and D levels Follow up one year

## 2021-09-15 ENCOUNTER — Telehealth: Payer: Self-pay

## 2021-09-15 DIAGNOSIS — E538 Deficiency of other specified B group vitamins: Secondary | ICD-10-CM

## 2021-09-15 NOTE — Telephone Encounter (Signed)
-----   Message from Pieter Partridge, DO sent at 09/05/2021  4:15 PM EDT ----- B12 level is on the low side.  Would start over the counter B12 1052mcg daily.  Repeat in 6 months.

## 2021-09-18 NOTE — Telephone Encounter (Signed)
Pt advised of Lab results. Repeat Labs in 6 month

## 2021-09-26 ENCOUNTER — Telehealth: Payer: Self-pay | Admitting: Neurology

## 2021-09-26 NOTE — Telephone Encounter (Signed)
The pt's sister called in stating the patient's B12 is making her heart race and she feels jumpy. They wanted to see if they can reduce it?

## 2021-09-27 NOTE — Telephone Encounter (Signed)
Spoke to pt sister, the B12 was the only new medication the patient is taking she do not know if there is anything else new she was given. Pt in a nursing home and the medication is administered by the Nurse. Pt advised her that she felt like she should be jumping around somewhere and heart was rasing.  Please advise.

## 2021-09-27 NOTE — Telephone Encounter (Signed)
Telephone call to pt [facility] to speak to the patient nurse.  nurse in a meeting rep at front desk will give the note to the Nurse once she is done.   Pt wanted the patient to stop the B12 and see if that helps.   Patient sister Stephanie Zhang advised as well.

## 2021-10-03 ENCOUNTER — Other Ambulatory Visit: Payer: Self-pay | Admitting: Family Medicine

## 2021-10-03 DIAGNOSIS — J3489 Other specified disorders of nose and nasal sinuses: Secondary | ICD-10-CM

## 2021-10-03 DIAGNOSIS — R0982 Postnasal drip: Secondary | ICD-10-CM

## 2021-10-03 DIAGNOSIS — J069 Acute upper respiratory infection, unspecified: Secondary | ICD-10-CM

## 2021-10-11 ENCOUNTER — Other Ambulatory Visit: Payer: Self-pay

## 2021-10-11 ENCOUNTER — Ambulatory Visit (INDEPENDENT_AMBULATORY_CARE_PROVIDER_SITE_OTHER): Payer: 59

## 2021-10-11 DIAGNOSIS — Z23 Encounter for immunization: Secondary | ICD-10-CM | POA: Diagnosis not present

## 2021-11-21 ENCOUNTER — Telehealth: Payer: Self-pay

## 2021-11-21 NOTE — Telephone Encounter (Signed)
Patient calling in with respiratory symptoms: Shortness of breath, chest pain, palpitations or other red words send to Triage  Does the patient have a fever over 100, cough, congestion, sore throat, runny nose, lost of taste/smell (please list symptoms that patient has)? sore throat & nasal congestion. Denies fever or body aches at this time.  What date did symptoms start? 11/20/21 (If over 5 days ago, pt may be scheduled for in person visit)  Have you tested for Covid in the last 5 days? Yes   If yes, was it positive []  OR negative [x] ?   "you will have to arrive 33mins prior to your appt time to be Covid tested. Please park in back of office at the [facility] & call [phone] to let the staff know you have arrived. A staff member will meet you at your car to do a rapid covid test. Once the test has resulted you will be notified by phone of your results to determine if appt will remain an in person visit or be converted to a virtual/phone visit. If you arrive less than 85mins before your appt time, your visit will be automatically converted to virtual & any recommended testing will happen AFTER the visit."   Darrick Meigs states she is working on transportation to sick visit for 2:15pm arrival. Will need to convert to phone visit if any test is positive.

## 2021-11-21 NOTE — Telephone Encounter (Signed)
Christian from [facility] called to report patient has been exposed to Covid and is complaining of sore throat. Call back # [phone]

## 2021-11-22 ENCOUNTER — Encounter: Payer: Self-pay | Admitting: Family Medicine

## 2021-11-22 ENCOUNTER — Other Ambulatory Visit: Payer: Self-pay

## 2021-11-22 ENCOUNTER — Telehealth (INDEPENDENT_AMBULATORY_CARE_PROVIDER_SITE_OTHER): Payer: 59 | Admitting: Family Medicine

## 2021-11-22 DIAGNOSIS — J101 Influenza due to other identified influenza virus with other respiratory manifestations: Secondary | ICD-10-CM

## 2021-11-22 DIAGNOSIS — R0981 Nasal congestion: Secondary | ICD-10-CM

## 2021-11-22 DIAGNOSIS — J029 Acute pharyngitis, unspecified: Secondary | ICD-10-CM

## 2021-11-22 LAB — POCT INFLUENZA A/B
Influenza A, POC: POSITIVE — AB
Influenza B, POC: NEGATIVE

## 2021-11-22 LAB — POC COVID19 BINAXNOW: SARS Coronavirus 2 Ag: NEGATIVE

## 2021-11-22 MED ORDER — OSELTAMIVIR PHOSPHATE 75 MG PO CAPS: 75.0000 mg | ORAL_CAPSULE | Freq: Two times a day (BID) | ORAL | 0 refills | Status: AC

## 2021-11-22 NOTE — Progress Notes (Signed)
Virtual Visit via Telephone Note  I connected with Stephanie Zhang on 11/22/21 at  3:00 PM EST by telephone and verified that I am speaking with the correct person using two identifiers.   I discussed the limitations, risks, security and privacy concerns of performing an evaluation and management service by telephone and the availability of in person appointments. I also discussed with the patient that there may be a patient responsible charge related to this service. The patient expressed understanding and agreed to proceed.  Location patient: home Location provider: work or home office Participants present for the call: patient, provider, pt's brother Stephanie Zhang and her sister Patient did not have a visit in the prior 7 days to address this/these issue(s).   History of Present Illness: Pt is a 60 yo female with pmh sig for MS, HTN, gastroparesis, malnutrition, history of breast cancer who was seen for acute concern.  Pt started feeling bad on late Sunday with sore throat, congestion, ear discomfort, fatigue, elevated temp 100 F, and cough. Decreased appetite.    Not drinking much.  Denies SOB.  Denies Tylenol.  Pt is a resident at [facility]    Observations/Objective: Patient sounds cheerful and well on the phone. I do not appreciate any SOB. Speech and thought processing are grossly intact. Patient reported vitals:  Assessment and Plan: Influenza A -Positive POC influenza A testing -Discussed supportive care including rest, hydration, gargling with warm salt water or Chloraseptic spray Tylenol, warm fluids, OTC cough/cold medicine such as Coricidin HBP -Continue using Flonase daily -Discussed r/b/a of antiviral medication Tamiflu.  We will start Tamiflu. -Stressed the importance of staying hydrated -Given strict precautions for worsening symptoms including but not limited to SOB, decreased p.o. intake, etc.  Nasal congestion  - Plan: POC Influenza A/B, POC COVID-19  Sore  throat  - Plan: POC Influenza A/B, POC COVID-19  Follow Up Instructions: F/u prn  [id number] 5-10 [id number] 11-20 9443 21-30 I did not refer this patient for an OV in the next 24 hours for this/these issue(s).  I discussed the assessment and treatment plan with the patient. The patient was provided an opportunity to ask questions and all were answered. The patient agreed with the plan and demonstrated an understanding of the instructions.   The patient was advised to call back or seek an in-person evaluation if the symptoms worsen or if the condition fails to improve as anticipated.  I provided 11 minutes of non-face-to-face time during this encounter.   [name], MD

## 2022-01-19 ENCOUNTER — Telehealth: Payer: Self-pay

## 2022-01-19 NOTE — Telephone Encounter (Signed)
Faxed form received from [facility]. Form to be placed on Dr Volanda Napoleon desk for review & signature.

## 2022-01-23 NOTE — Telephone Encounter (Signed)
FL2 forms faxed (with paper confirmation) to [place]. File copy to chart & drawer.

## 2022-03-27 ENCOUNTER — Other Ambulatory Visit: Payer: Self-pay | Admitting: Family Medicine

## 2022-05-31 ENCOUNTER — Telehealth: Payer: Self-pay

## 2022-05-31 NOTE — Telephone Encounter (Signed)
Last documented mammogram: 10/20/18 at [facility]: over a year. Last OV 11/22/21 video visit  Left message with [facility] staff for pt to call office to schedule CPE. If pt returns please schedule CPE & transfer to [facility] for mammogram.

## 2022-06-14 ENCOUNTER — Ambulatory Visit (INDEPENDENT_AMBULATORY_CARE_PROVIDER_SITE_OTHER): Payer: 59 | Admitting: Family Medicine

## 2022-06-14 VITALS — BP 96/72 | HR 88 | Temp 98.1°F | Ht 64.0 in | Wt 145.4 lb

## 2022-06-14 DIAGNOSIS — R300 Dysuria: Secondary | ICD-10-CM

## 2022-06-14 DIAGNOSIS — I10 Essential (primary) hypertension: Secondary | ICD-10-CM

## 2022-06-14 DIAGNOSIS — B354 Tinea corporis: Secondary | ICD-10-CM | POA: Diagnosis not present

## 2022-06-14 DIAGNOSIS — G35 Multiple sclerosis: Secondary | ICD-10-CM

## 2022-06-14 DIAGNOSIS — Z0000 Encounter for general adult medical examination without abnormal findings: Secondary | ICD-10-CM | POA: Diagnosis not present

## 2022-06-14 DIAGNOSIS — Z853 Personal history of malignant neoplasm of breast: Secondary | ICD-10-CM | POA: Diagnosis not present

## 2022-06-14 LAB — LIPID PANEL
Cholesterol: 208 mg/dL — ABNORMAL HIGH (ref 0–200)
HDL: 69.6 mg/dL (ref >39.00)
LDL Cholesterol: 122 mg/dL — ABNORMAL HIGH (ref 0–99)
NonHDL: 138.59
Total CHOL/HDL Ratio: 3
Triglycerides: 84 mg/dL (ref 0.0–149.0)
VLDL: 16.8 mg/dL (ref 0.0–40.0)

## 2022-06-14 LAB — COMPREHENSIVE METABOLIC PANEL
ALT: 13 U/L (ref 0–35)
AST: 16 U/L (ref 0–37)
Albumin: 4.2 g/dL (ref 3.5–5.2)
Alkaline Phosphatase: 103 U/L (ref 39–117)
BUN: 14 mg/dL (ref 6–23)
CO2: 28 mEq/L (ref 19–32)
Calcium: 9.9 mg/dL (ref 8.4–10.5)
Chloride: 102 mEq/L (ref 96–112)
Creatinine, Ser: 1 mg/dL (ref 0.40–1.20)
GFR: 61.21 mL/min (ref >60.00)
Glucose, Bld: 90 mg/dL (ref 70–99)
Potassium: 4 mEq/L (ref 3.5–5.1)
Sodium: 137 mEq/L (ref 135–145)
Total Bilirubin: 1.1 mg/dL (ref 0.2–1.2)
Total Protein: 6.6 g/dL (ref 6.0–8.3)

## 2022-06-14 LAB — POCT URINALYSIS DIPSTICK
Bilirubin, UA: NEGATIVE
Blood, UA: NEGATIVE
Glucose, UA: NEGATIVE
Ketones, UA: NEGATIVE
Nitrite, UA: POSITIVE
Protein, UA: NEGATIVE
Spec Grav, UA: 1.015 (ref 1.010–1.025)
Urobilinogen, UA: 0.2 E.U./dL
pH, UA: 7 (ref 5.0–8.0)

## 2022-06-14 LAB — CBC WITH DIFFERENTIAL/PLATELET
Basophils Absolute: 0 10*3/uL (ref 0.0–0.1)
Basophils Relative: 0.6 % (ref 0.0–3.0)
Eosinophils Absolute: 0.1 10*3/uL (ref 0.0–0.7)
Eosinophils Relative: 1.7 % (ref 0.0–5.0)
HCT: 40.2 % (ref 36.0–46.0)
Hemoglobin: 13.6 g/dL (ref 12.0–15.0)
Lymphocytes Relative: 18.5 % (ref 12.0–46.0)
Lymphs Abs: 1.3 10*3/uL (ref 0.7–4.0)
MCHC: 33.8 g/dL (ref 30.0–36.0)
MCV: 91.6 fl (ref 78.0–100.0)
Monocytes Absolute: 0.6 10*3/uL (ref 0.1–1.0)
Monocytes Relative: 9.1 % (ref 3.0–12.0)
Neutro Abs: 4.9 10*3/uL (ref 1.4–7.7)
Neutrophils Relative %: 70.1 % (ref 43.0–77.0)
Platelets: 231 10*3/uL (ref 150.0–400.0)
RBC: 4.38 Mil/uL (ref 3.87–5.11)
RDW: 13.5 % (ref 11.5–15.5)
WBC: 7 10*3/uL (ref 4.0–10.5)

## 2022-06-14 LAB — TSH: TSH: 1.9 u[IU]/mL (ref 0.35–5.50)

## 2022-06-14 LAB — HEMOGLOBIN A1C: Hgb A1c MFr Bld: 5.7 % (ref 4.6–6.5)

## 2022-06-14 LAB — T4, FREE: Free T4: 1.29 ng/dL (ref 0.60–1.60)

## 2022-06-14 MED ORDER — NITROFURANTOIN MONOHYD MACRO 100 MG PO CAPS: 100.0000 mg | ORAL_CAPSULE | Freq: Two times a day (BID) | ORAL | 0 refills | Status: AC

## 2022-06-14 MED ORDER — MICONAZOLE NITRATE 2 % EX CREA: 1.0000 | TOPICAL_CREAM | Freq: Two times a day (BID) | CUTANEOUS | 0 refills | Status: DC

## 2022-06-14 NOTE — Progress Notes (Signed)
Subjective:  Patient accompanied by her sister-in-law.   Stephanie Zhang is a 61 y.o. female and is here for a comprehensive physical exam. The patient reports doing well.  Patient participating in activities at [facility].  Family inquires if some medications on med list can be offered as needed.  Pt sister-in-law inquires about checking patient's urine as concern for infection.  Patient does not drink much water throughout the day.  Patient followed by neurology for MS which has been stable.  BP controlled on current medications.  Pt with history of breast cancer, last mammogram was 10/20/2018.  Colonoscopy done 2014.   Social History   Socioeconomic History   Marital status: Widowed    Spouse name: Not on file   Number of children: 0   Years of education: Not on file   Highest education level: Not on file  Occupational History   Occupation: Retired Astronomer    Comment: [employer] shop/advertisment [employer] shows  Tobacco Use   Smoking status: Former    Types: Cigarettes    Quit date: 05/11/2011    Years since quitting: 11.1   Smokeless tobacco: Never  Substance and Sexual Activity   Alcohol use: No    Comment: "last alcohol 12/04/11"   Drug use: No    Types: LSD, Marijuana, "Crack" cocaine    Comment: "last drug use ~ 1980"   Sexual activity: Never  Other Topics Concern   Not on file  Social History Narrative   Lives alone at [facility] assisted living; nursing manages medications.    Has two sisters, one brother; [name], manages finances; nursing at home manages medications   Enjoys playing cards with friends at home.    Social Determinants of Health   Financial Resource Strain: Low Risk  (02/03/2019)   Overall Financial Resource Strain (CARDIA)    Difficulty of Paying Living Expenses: Not hard at all  Food Insecurity: No Food Insecurity (02/03/2019)   Hunger Vital Sign    Worried About Running Out of Food in the Last Year: Never true    Ran Out  of Food in the Last Year: Never true  Transportation Needs: No Transportation Needs (02/03/2019)   PRAPARE - Hydrologist (Medical): No    Lack of Transportation (Non-Medical): No  Physical Activity: Inactive (02/03/2019)   Exercise Vital Sign    Days of Exercise per Week: 0 days    Minutes of Exercise per Session: 0 min  Stress: No Stress Concern Present (02/03/2019)   [place]    Feeling of Stress : Not at all  Social Connections: Moderately Isolated (02/03/2019)   Social Connection and Isolation Panel [NHANES]    Frequency of Communication with Friends and Family: More than three times a week    Frequency of Social Gatherings with Friends and Family: Once a week    Attends Religious Services: Never    Marine scientist or Organizations: No    Attends Archivist Meetings: Never    Marital Status: Widowed  Intimate Partner Violence: Not on file   Health Maintenance  Topic Date Due   COVID-19 Vaccine (1) Never done   TETANUS/TDAP  Never done   Zoster Vaccines- Shingrix (1 of 2) Never done   PAP SMEAR-Modifier  08/09/2014   MAMMOGRAM  10/21/2019   INFLUENZA VACCINE  07/10/2022   COLONOSCOPY (Pts 45-26yr Insurance coverage will need to be confirmed)  04/22/2023  Hepatitis C Screening  Completed   HIV Screening  Completed   HPV VACCINES  Aged Out    The following portions of the patient's history were reviewed and updated as appropriate: allergies, current medications, past family history, past medical history, past social history, past surgical history, and problem list.  Review of Systems Pertinent items noted in HPI and remainder of comprehensive ROS otherwise negative.   Objective:    BP 96/72 (BP Location: Right Arm, Patient Position: Sitting, Cuff Size: Normal)   Pulse 88   Temp 98.1 F (36.7 C) (Oral)   Ht '5\' 4"'$  (1.626 m)   Wt 145 lb 6.4 oz (66 kg)   LMP  08/09/2008   SpO2 96%   BMI 24.96 kg/m  General appearance: alert, cooperative, and no distress Head: Normocephalic, without obvious abnormality, atraumatic Eyes: conjunctivae/corneas clear. PERRL, EOM's intact. Fundi benign. Ears: normal TM's and external ear canals both ears Nose: Nares normal. Septum midline. Mucosa normal. No drainage or sinus tenderness. Throat: lips, mucosa, and tongue normal; teeth and gums normal Neck: no adenopathy, no carotid bruit, no JVD, supple, symmetrical, trachea midline, and thyroid not enlarged, symmetric, no tenderness/mass/nodules Lungs: clear to auscultation bilaterally Heart: regular rate and rhythm, S1, S2 normal, no murmur, click, rub or gallop Abdomen: soft, non-tender; bowel sounds normal; no masses,  no organomegaly Extremities: extremities normal, atraumatic, no cyanosis or edema Pulses: 2+ and symmetric Skin:  Warm, dry, intact.  Bilateral calves with pruritic erythematous circular lesion with rolled edges. Lymph nodes: Cervical, supraclavicular, and axillary nodes normal. Neurologic: A&O x3.  CN II through XII grossly intact.  Gait not assessed as patient sitting in transport wheelchair.   Assessment:    Healthy female exam.      Plan:    Anticipatory guidance given including wearing seatbelts, smoke detectors in the home, increasing physical activity, increasing p.o. intake of water and vegetables. -Will obtain labs -Immunizations reviewed -Colonoscopy done 04/21/2013.  Discussed scheduling 10-year recall -Mammogram due.  Given information to schedule -Bone density order placed -Given handout -Next see CPE in 1 year See After Visit Summary for Counseling Recommendations   Well adult exam  - Plan: CBC with Differential/Platelet, Hemoglobin A1c, Lipid panel  Essential hypertension -Well-controlled -Continue current medications including lisinopril 5 mg daily - Plan: POCT urinalysis dipstick, TSH, T4, Free, Lipid panel, CMP  MS  (multiple sclerosis) (HCC) -Stable -Continue follow-up with neurology - Plan: POCT urinalysis dipstick  Tinea corporis - Plan: miconazole (MICONAZOLE ANTIFUNGAL) 2 % cream  History of breast cancer - Plan: DG Bone Density, MM Digital Screening  Dysuria  -UA with 3+ leuks, cloudy, positive nitrite -We will send for UCX -Start Macrobid while awaiting culture results -Patient to increase p.o. intake of water and fluids - Plan: CBC with Differential/Platelet, nitrofurantoin, macrocrystal-monohydrate, (MACROBID) 100 MG capsule, Culture, Urine, Culture, Urine  Follow-up as needed  Grier Mitts, MD

## 2022-06-14 NOTE — Patient Instructions (Addendum)
An order was placed for bone density scan and a mammogram.  You should expect a phone call about scheduling these appointments.  The number for [facility] imaging is [phone].  A prescription for miconazole cream was sent to the pharmacy to use on the rash on your leg.  Your urine looks like you may have a urinary tract infection.  A prescription for Macrobid, an antibiotic was sent to your pharmacy as well.  You are to take Macrobid twice a day for the next 7 days.  Make sure you are drinking more water while you are taking this medication.  If needed we will change prescription antibiotic based on culture results.

## 2022-06-16 LAB — URINE CULTURE
MICRO NUMBER:: 13612292
SPECIMEN QUALITY:: ADEQUATE

## 2022-06-19 NOTE — Telephone Encounter (Signed)
LVM for pt to call back to schedule mammogram. Please transfer to Jamoni Hewes.

## 2022-06-20 NOTE — Telephone Encounter (Signed)
Pt's sister called to coordinate this appointment as patient is in an assisted living facility

## 2022-06-20 NOTE — Telephone Encounter (Signed)
Mammogram & Bone Density scheduled for July 21 at 1p per sister report. She is aware that [facility] will be sending [place] an order to have both completed.

## 2022-06-20 NOTE — Telephone Encounter (Signed)
Sister states order was placed for bone density & mammogram. Since pt has hx of breast cancer she requested that pt goes where she has been previously. Given number to [facility] & requested sister to call this RN back to let her know when the appt is. Sister verb understanding.

## 2022-06-20 NOTE — Telephone Encounter (Signed)
Pt sister Judson Roch is calling and was given the number to [facility] again [phone]

## 2022-06-22 ENCOUNTER — Encounter: Payer: Self-pay | Admitting: Family Medicine

## 2022-06-26 ENCOUNTER — Telehealth: Payer: Self-pay | Admitting: Family Medicine

## 2022-06-26 DIAGNOSIS — B354 Tinea corporis: Secondary | ICD-10-CM

## 2022-06-26 MED ORDER — MICONAZOLE NITRATE 2 % EX CREA: 1.0000 | TOPICAL_CREAM | Freq: Two times a day (BID) | CUTANEOUS | 0 refills | Status: AC

## 2022-06-26 NOTE — Telephone Encounter (Signed)
Last OV 06/14/22 Medication refilled to pharmacy requested.

## 2022-06-26 NOTE — Telephone Encounter (Signed)
[facility] is requesting a refill of the miconazole (MICONAZOLE ANTIFUNGAL) 2 % cream   Please send to:  [facility], [address]. Phone:  [phone]  Fax:  [phone]

## 2022-06-29 LAB — HM MAMMOGRAPHY

## 2022-06-29 LAB — HM DEXA SCAN

## 2022-07-03 ENCOUNTER — Encounter: Payer: Self-pay | Admitting: Family Medicine

## 2022-07-03 NOTE — Progress Notes (Signed)
[facility] 

## 2022-07-10 NOTE — Telephone Encounter (Signed)
Patient had both done and was abstracted.

## 2022-07-13 ENCOUNTER — Telehealth: Payer: Self-pay | Admitting: Family Medicine

## 2022-07-13 NOTE — Telephone Encounter (Signed)
Pt sister call and stated the cream that was sent to pt don't work and want dr.Banks to send her something else in [place]  [id number]:  [phone] Fax:  [phone]

## 2022-07-17 MED ORDER — KETOCONAZOLE 2 % EX CREA: 1.0000 | TOPICAL_CREAM | Freq: Every day | CUTANEOUS | 0 refills | Status: AC

## 2022-07-17 NOTE — Telephone Encounter (Signed)
Spoke with Dr Volanda Napoleon about refilling, stated to send another cream in place. Sent ketoconazole to pharmacy requested.

## 2022-07-17 NOTE — Telephone Encounter (Signed)
Pt called in stating if the below can be sent in.

## 2022-07-17 NOTE — Addendum Note (Signed)
Addended by: Anderson Malta on: 07/17/2022 04:05 PM   Modules accepted: Orders

## 2022-08-07 ENCOUNTER — Other Ambulatory Visit: Payer: Self-pay | Admitting: Family Medicine

## 2022-08-16 ENCOUNTER — Telehealth: Payer: Self-pay | Admitting: Family Medicine

## 2022-08-16 NOTE — Telephone Encounter (Signed)
Please disregard

## 2022-08-17 ENCOUNTER — Ambulatory Visit (INDEPENDENT_AMBULATORY_CARE_PROVIDER_SITE_OTHER): Payer: 59 | Admitting: Family Medicine

## 2022-08-17 VITALS — BP 90/68 | HR 81 | Temp 98.5°F | Wt 145.0 lb

## 2022-08-17 DIAGNOSIS — I10 Essential (primary) hypertension: Secondary | ICD-10-CM

## 2022-08-17 DIAGNOSIS — B354 Tinea corporis: Secondary | ICD-10-CM

## 2022-08-17 MED ORDER — TERBINAFINE HCL 1 % EX CREA: 1.0000 | TOPICAL_CREAM | Freq: Two times a day (BID) | CUTANEOUS | 1 refills | Status: AC

## 2022-08-17 NOTE — Progress Notes (Unsigned)
Subjective:    Patient ID: Stephanie Zhang, female    DOB: 24-Aug-1961, 61 y.o.   MRN: [id number]  Chief Complaint  Patient presents with   Medication Refill    Cream for legs, states did not really get any better. Looks like it may have tried to clear up  Pt accompanied by her sister-in-law.  HPI Patient was seen today for ongoing concern.  Patient previously seen 06/14/2022 for CPE however noted to have lesions on bilateral LEs consistent with tinea corporis.  Patient given miconazole cream but notes slightly improved rash.  Patient resides at [facility].  Per patient at times cream given twice a day, sometimes once daily.  Patient with history of hypertension on lisinopril 5 mg daily. Past Medical History:  Diagnosis Date   Anxiety    Blood transfusion    "when I was born"   Breast cancer ([place]) 10/2001   Stage II (right side)   Chronic back pain    Confusion    Depression    Gastroparesis    GERD (gastroesophageal reflux disease)    Headache(784.0)    "lots"   Hypoglycemia    MS (multiple sclerosis) (HCC) 1987   Multiple sclerosis    Osteoporosis    Short-term memory loss     Allergies  Allergen Reactions   Amantadines    Sulfa Antibiotics Itching and Rash   Sulfacetamide Sodium Itching and Rash    ROS General: Denies fever, chills, night sweats, changes in weight, changes in appetite HEENT: Denies headaches, ear pain, changes in vision, rhinorrhea, sore throat CV: Denies CP, palpitations, SOB, orthopnea Pulm: Denies SOB, cough, wheezing GI: Denies abdominal pain, nausea, vomiting, diarrhea, constipation GU: Denies dysuria, hematuria, frequency, vaginal discharge Msk: Denies muscle cramps, joint pains Neuro: Denies weakness, numbness, tingling Skin: Denies rashes, bruising Psych: Denies depression, anxiety, hallucinations      Objective:    Blood pressure 90/68, pulse 81, temperature 98.5 F (36.9 C), temperature source Oral, weight 145 lb (65.8 kg), last  menstrual period 08/09/2008, SpO2 98 %.   Gen. Pleasant, well-nourished, in no distress, normal affect  *** HEENT: [place]/AT, face symmetric, conjunctiva clear, no scleral icterus, PERRLA, EOMI, nares patent without drainage, pharynx without erythema or exudate. Neck: No JVD, no thyromegaly, no carotid bruits Lungs: no accessory muscle use, CTAB, no wheezes or rales Cardiovascular: RRR, no m/r/g, no ***peripheral edema Abdomen: BS present, soft, NT/ND, no hepatosplenomegaly. Musculoskeletal: No deformities, no cyanosis or clubbing, normal tone Neuro:  A&Ox3, CN II-XII intact, normal gait Skin:  Warm, no lesions/ rash   Wt Readings from Last 3 Encounters:  08/17/22 145 lb (65.8 kg)  06/14/22 145 lb 6.4 oz (66 kg)  09/05/21 145 lb 3.2 oz (65.9 kg)    Lab Results  Component Value Date   WBC 7.0 06/14/2022   HGB 13.6 06/14/2022   HCT 40.2 06/14/2022   PLT 231.0 06/14/2022   GLUCOSE 90 06/14/2022   CHOL 208 (H) 06/14/2022   TRIG 84.0 06/14/2022   HDL 69.60 06/14/2022   LDLCALC 122 (H) 06/14/2022   ALT 13 06/14/2022   AST 16 06/14/2022   NA 137 06/14/2022   K 4.0 06/14/2022   CL 102 06/14/2022   CREATININE 1.00 06/14/2022   BUN 14 06/14/2022   CO2 28 06/14/2022   TSH 1.90 06/14/2022   HGBA1C 5.7 06/14/2022    Assessment/Plan:  No diagnosis found.  F/u ***  Grier Mitts, MD

## 2022-08-21 ENCOUNTER — Other Ambulatory Visit: Payer: Self-pay | Admitting: Family Medicine

## 2022-08-21 ENCOUNTER — Encounter: Payer: Self-pay | Admitting: Family Medicine

## 2022-08-21 DIAGNOSIS — B354 Tinea corporis: Secondary | ICD-10-CM

## 2022-08-27 ENCOUNTER — Telehealth: Payer: Self-pay | Admitting: Family Medicine

## 2022-08-27 NOTE — Telephone Encounter (Signed)
Tried calling patient to schedule Medicare Annual Wellness Visit (AWV) either virtually or in office.   No answer  Last AWV ;02/03/19  please schedule at anytime with [facility] Nurse Health Advisor 1 or 2

## 2022-09-03 NOTE — Progress Notes (Unsigned)
NEUROLOGY FOLLOW UP OFFICE NOTE  [place] [id number]  Assessment/Plan:   Secondary progressive multiple sclerosis Cerebral meningioma Major neurocognitive disorder due to MS  DMT:  Not indicated in inactive secondary progressive MS Repeat MRI of brain with and without contrast to follow up on meningioma Follow up in one year   Subjective:  Stephanie Zhang is a 61 year old female with chronic back pain, osteoporosis, GERD, thrombocytopenia and history of breast cancer who follows up for multiple sclerosis.   UPDATE: Current DMT:  None Other medications: Bupropion 75 mg daily for depression, D3 2000 IU daily  B12 level from a year ago was 214.  Advised to restart B12 102mg daily.  It was discontinued the previous month because it caused her heart to race and was making her feel jumpy.      Vision: No issues. Motor: No issues Sensory: No issues Pain: No issues Gait: Able to ambulate with walker.  She walks around the building at least 3 times a day. Bowel/Bladder: Stress incontinence.   Fatigue: Yes Cognition: Short-term memory deficits.  Does not remember certain family members are dead.  No recent visual hallucinations such as involving animals.  Brother manages her finances and is her financial POA Mood: She is short-tempered.   Gets into bed around 12 AM.  May get 8 hours of sleep a night.     Labs from 02/23/2021:  vit D 25-hydroxy 43.23; TSH 2.06; B12 179.  She was supposed to start B12 injections however she says she never started it.     HISTORY: She was diagnosed with multiple sclerosis at around age 61  She reports left sided numbness, ataxia, and visual disturbance.  She had bilateral INO.  She did undergo a lumbar puncture at the time..[name]  She was initially on Avonex, which was switched to Rebif in 2004 due to change in insurance.  Rebif was subsequently denied due to diagnosis of SPMS.   She has required treatment of flare-ups with Solu-Medrol, usually  presenting as worsening gait with falls.  As per prior neurologist's notes, her last steroid treatment was in 2010.  She has had a progressive physical decline since then.   Her ex-husband passed away on 1January 26, 2018  Since then, she reports visual and auditory hallucinations.  In the middle of the night, she may see and hear her ex-husband outside the window.  On at least one occasion, she woke up in the middle of the night and heard knocking on her window.  She saw somebody outside speaking [place]  She told the nurses and they didn't see anyone.  One afternoon, she was walking in the courtyard and saw a [place]  Her friend didn't see anything.  Later, when she was in the dining room, she [place].  Sometimes, these hallucinations are frighthening.  They occur 2 to 3 times a week.   She currently lives in assisted living at [facility]  At baseline, she exhibits extra-pyramidal symptoms such as tremor.  She has memory deficits.  She requires use of a walker since 2005.  She has neurogenic bladder.  She needs to push down on her stomach in order to help void.  She sometimes has constipation or diarrhea.   MRI of brain with and without contrast from 09/08/16 reports "no change in the appearance of numerous subcortical, periventricular, and deep white matter T2 hyperintense lesions" when compared to prior imaging from 10/23/08.  However, it also reports mild interval enlargement of  a meningioma "in the left frontal dura which measures 5 x 11 mm, previously 2 x 10 mm."  She was referred to neurosurgery for evaluation of meningioma.  Continued monitoring was recommended.  MRI of brain with and without contrast from 10/22/17 was stable compared to prior study from 09/08/16 with no evidence of progression or contrast enhancing lesions.  Left frontal convexity meningioma is stable at 8 by 7 mm.  MRI of brain with and without contrast from 10/07/18 was stable compared to prior MRI from 10/22/17.     PAST MEDICAL HISTORY: Past Medical History:  Diagnosis Date   Anxiety    Blood transfusion    "when I was born"   Breast cancer ([place]) 10/2001   Stage II (right side)   Chronic back pain    Confusion    Depression    Gastroparesis    GERD (gastroesophageal reflux disease)    Headache(784.0)    "lots"   Hypoglycemia    MS (multiple sclerosis) ([place]) 1987   Multiple sclerosis    Osteoporosis    Short-term memory loss     MEDICATIONS: Current Outpatient Medications on File Prior to Visit  Medication Sig Dispense Refill   acetaminophen (TYLENOL) 500 MG tablet Take 1,000 mg by mouth every 6 (six) hours as needed for mild pain.     buPROPion (WELLBUTRIN) 75 MG tablet Take 2 tablets (150 mg total) by mouth daily. 60 tablet 5   Calcium Carbonate-Vitamin D 600-400 MG-UNIT tablet Take 1 tablet by mouth daily. 30 tablet 5   Cholecalciferol (VITAMIN D) 50 MCG (2000 UT) tablet Take 1 TAB BY MOUTH EVERY DAY 30 tablet 6   cyclobenzaprine (FLEXERIL) 5 MG tablet Take 1 tablet (5 mg total) by mouth 2 (two) times daily as needed for muscle spasms. 6 tablet 0   docusate sodium (COLACE) 100 MG capsule Take 1 capsule (100 mg total) by mouth 2 (two) times daily. 180 capsule 1   fluticasone (FLONASE) 50 MCG/ACT nasal spray PLACE 1 SPRAY INTO EACH NOSTRIL EVERY DAY AS NEEDED 16 g 11   guaiFENesin (ROBITUSSIN) 100 MG/5ML liquid Take 200 mg by mouth every 6 (six) hours as needed for cough. (Patient not taking: Reported on 06/14/2022)     hydrocortisone cream 1 % Apply twice daily to affected areas (Patient not taking: Reported on 06/14/2022) 45 g 0   ibuprofen (ADVIL) 600 MG tablet Take 1 tablet (600 mg total) by mouth every 6 (six) hours as needed for moderate pain. (Patient not taking: Reported on 06/14/2022) 30 tablet 0   ketoconazole (NIZORAL) 2 % cream Apply 1 Application topically daily. 30 g 0   lisinopril (ZESTRIL) 5 MG tablet Take 1 tablet (5 mg total) by mouth daily. 30 tablet 3   loperamide  (IMODIUM) 2 MG capsule Take 2 mg by mouth every 4 (four) hours as needed for diarrhea or loose stools. (Patient not taking: Reported on 06/14/2022)     loratadine (CLARITIN) 10 MG tablet Take 1 tablet (10 mg total) by mouth daily. 30 tablet 2   polyethylene glycol (MIRALAX / GLYCOLAX) 17 g packet MIX 1 PACKET IN 8OZ OF WATER AND DRINK EVERY DAY AS NEEDED 30 packet 11   promethazine (PHENERGAN) 25 MG tablet Take 25 mg by mouth every 6 (six) hours as needed for nausea or vomiting. (Patient not taking: Reported on 06/14/2022)     senna (SENOKOT) 8.6 MG tablet Take by mouth. (Patient not taking: Reported on 06/14/2022)     Skin Protectants,  Misc. (EUCERIN) cream Apply 1 application topically 2 (two) times daily as needed for dry skin. (Patient not taking: Reported on 06/14/2022) 454 g 0   terbinafine (LAMISIL) 1 % cream Apply 1 Application topically 2 (two) times daily. 42 g 1   No current facility-administered medications on file prior to visit.    ALLERGIES: Allergies  Allergen Reactions   Amantadines    Sulfa Antibiotics Itching and Rash   Sulfacetamide Sodium Itching and Rash    FAMILY HISTORY: Family History  Problem Relation Age of Onset   Diabetes Mother    Cancer Mother        cervical cancer   Heart disease Mother    Diabetes Father       Objective:  Blood pressure 117/76, pulse 98, height '5\' 4"'$  (1.626 m), weight 148 lb (67.1 kg), last menstrual period 08/09/2008, SpO2 99 %. General: No acute distress.  Patient appears well-groomed.   Head:  Normocephalic/atraumatic Eyes:  Fundi examined but not visualized Neck: supple, no paraspinal tenderness, full range of motion Heart:  Regular rate and rhythm Neurological Exam: alert and oriented to person, place, and time.  Speech fluent and not dysarthric, language intact.  Bilateral internuclear ophthalmoplegia.  Otherwise, CN II-XII intact. Bulk and tone normal, muscle strength 5-/5 left upper extremity, otherwise 5/5 throughout.  Sensation  to light touch intact.  Deep tendon reflexes 2+ throughout.  Finger to nose testing with bilateral tremor and dysmetria, heel-to-shin testing with dysmetria.  Spastic ataxic gait.  Unable to ambulate without assistance.  Romberg not tested.  Metta Clines, DO  CC: Grier Mitts, MD

## 2022-09-05 ENCOUNTER — Encounter: Payer: Self-pay | Admitting: Neurology

## 2022-09-05 ENCOUNTER — Ambulatory Visit (INDEPENDENT_AMBULATORY_CARE_PROVIDER_SITE_OTHER): Payer: 59 | Admitting: Neurology

## 2022-09-05 VITALS — BP 117/76 | HR 98 | Ht 64.0 in | Wt 148.0 lb

## 2022-09-05 DIAGNOSIS — R4189 Other symptoms and signs involving cognitive functions and awareness: Secondary | ICD-10-CM

## 2022-09-05 DIAGNOSIS — G35 Multiple sclerosis: Secondary | ICD-10-CM

## 2022-09-05 DIAGNOSIS — D320 Benign neoplasm of cerebral meninges: Secondary | ICD-10-CM | POA: Diagnosis not present

## 2022-09-05 NOTE — Patient Instructions (Signed)
Repeat MRI of brain with and without contrast to follow up on meningioma Otherwise, follow up one year

## 2022-09-12 ENCOUNTER — Encounter: Payer: Self-pay | Admitting: Family Medicine

## 2022-09-12 NOTE — Telephone Encounter (Signed)
Error/njr °

## 2022-09-18 ENCOUNTER — Other Ambulatory Visit: Payer: Self-pay | Admitting: Family Medicine

## 2022-09-18 DIAGNOSIS — B354 Tinea corporis: Secondary | ICD-10-CM

## 2022-09-26 ENCOUNTER — Ambulatory Visit
Admission: RE | Admit: 2022-09-26 | Discharge: 2022-09-26 | Disposition: A | Discharge status: Home / Self Care | Payer: 59 | Source: Ambulatory Visit | Attending: Neurology | Admitting: Neurology

## 2022-09-26 DIAGNOSIS — D320 Benign neoplasm of cerebral meninges: Secondary | ICD-10-CM

## 2022-09-26 DIAGNOSIS — G35 Multiple sclerosis: Secondary | ICD-10-CM

## 2022-09-26 DIAGNOSIS — G35D Multiple sclerosis, unspecified: Secondary | ICD-10-CM

## 2022-09-26 MED ORDER — GADOPICLENOL 0.5 MMOL/ML IV SOLN
7.0000 mL | Freq: Once | INTRAVENOUS | Status: AC | PRN
  Administered 2022-09-26: 7 mL via INTRAVENOUS

## 2022-12-14 ENCOUNTER — Telehealth: Payer: Self-pay | Admitting: Family Medicine

## 2022-12-14 NOTE — Telephone Encounter (Signed)
Stephanie Zhang from [facility] called to get some clarification on some cream that was prescribed to the pt for a ring worm.   They wanting to see if she still needs to take the cream? If not if you all can send a DC order over.   You can reach [place] at [phone]  Please advise.

## 2022-12-19 NOTE — Telephone Encounter (Signed)
Terbinafine cream given to patient in September for skin lesion.  Lesion should be resolved by now.  Okay to discontinue terbinafine cream. If lesion remains complex referral to dermatology for further evaluation.

## 2022-12-20 NOTE — Telephone Encounter (Signed)
Attempt to reach [facility]. got transferred to wrong extension. Reception, Lorriane Shire took a message and states she will have Varney Biles to contact us back.

## 2022-12-24 NOTE — Telephone Encounter (Signed)
Contact [facility] and spoke to [place]. Inform her of info from Dr. Volanda Napoleon. Verbalized understanding. She states she will check on pt's lesion. If need a referral, she will contact us.

## 2023-01-02 ENCOUNTER — Telehealth: Payer: Self-pay | Admitting: Family Medicine

## 2023-01-02 NOTE — Telephone Encounter (Addendum)
[place] at [place]  [phone] - Cell Phone Ok to leave a detailed message on this line  FAX:  [phone]  Requesting written orders  Requesting PT & OT (due to new fall on 12/25/22) With focus on: Gait, balance,  Upper & lower body strength  She says she has been having trouble since last week, sending the request via fax Fax number confirmed She will keep trying to fax request, as well.

## 2023-01-09 NOTE — Telephone Encounter (Signed)
Okay 

## 2023-01-10 NOTE — Telephone Encounter (Signed)
Attempt to reach [facility]. Left a detail voicemail message and to give [place] a call if has any question.

## 2023-03-28 ENCOUNTER — Encounter: Payer: Self-pay | Admitting: Neurology

## 2023-08-27 ENCOUNTER — Other Ambulatory Visit: Payer: Self-pay | Admitting: Family Medicine

## 2023-08-27 DIAGNOSIS — I10 Essential (primary) hypertension: Secondary | ICD-10-CM

## 2023-09-03 ENCOUNTER — Other Ambulatory Visit: Payer: Self-pay | Admitting: Family Medicine

## 2023-09-06 ENCOUNTER — Ambulatory Visit: Payer: 59 | Admitting: Neurology

## 2023-09-09 NOTE — Progress Notes (Unsigned)
NEUROLOGY FOLLOW UP OFFICE NOTE  Stephanie Zhang [id number]  Assessment/Plan:   Secondary progressive multiple sclerosis Cerebral meningioma Major neurocognitive disorder due to MS  DMT:  Not indicated in inactive secondary progressive MS No further changes. Follow up in one year  Total time spent in chart and face to face with patient:  34 minutes   Subjective:  Stephanie Zhang is a 62 year old female with chronic back pain, osteoporosis, GERD, thrombocytopenia and history of breast cancer who follows up for multiple sclerosis.  She is accompanied by her sister who supplements history.     UPDATE: Current DMT:  None Other medications: Bupropion 75 mg daily for depression, D3 50 IU daily   MRI of brain with and without contrast on 09/26/2022 personally reviewed showed stable extensive demyelinating disease with white matter atrophy and 1 cm meningioma along the left frontal convexity, stable compared to prior imaging from 09/26/2018.    Vision: No issues. Motor: No issues Sensory: No issues Pain: No issues Gait: Able to ambulate with walker.  She walks around the building at least 3 times a day.  She finished a round of PT. Bowel/Bladder: Stress incontinence.  Able to make it to the commode.   Fatigue: Yes Cognition: Short-term memory deficits are stable.  Does not remember certain family members are dead.  No recent visual hallucinations such as involving animals.  Brother manages her finances and is her financial POA Mood: She is sometimes short-tempered but mood overall good.   Gets into bed around 12 AM.  May get 8 hours of sleep a night.  Except for getting up to go to the bathroom, she sleeps well.     HISTORY: She was diagnosed with multiple sclerosis at around age 32.  She reports left sided numbness, ataxia, and visual disturbance.  She had bilateral INO.  She did undergo a lumbar puncture at the time.[name]   She was initially on Avonex, which was switched to Rebif in  2004 due to change in insurance.  Rebif was subsequently denied due to diagnosis of SPMS.   She has required treatment of flare-ups with Solu-Medrol, usually presenting as worsening gait with falls.  As per prior neurologist's notes, her last steroid treatment was in 2010.  She has had a progressive physical decline since then.   Her ex-husband passed away on 2017/02/01.  Since then, she reports visual and auditory hallucinations.  In the middle of the night, she may see and hear her ex-husband outside the window.  On at least one occasion, she woke up in the middle of the night and heard knocking on her window.  She saw somebody outside speaking [place].  She told the nurses and they didn't see anyone.  One afternoon, she was walking in the courtyard and saw a [place].  Her friend didn't see anything.  Later, when she was in the dining room, she saw the [place] outside the window.  Sometimes, these hallucinations are frighthening.  They occur 2 to 3 times a week.   She currently lives in assisted living at [facility].  At baseline, she exhibits extra-pyramidal symptoms such as tremor.  She has memory deficits.  She requires use of a walker since 2005.  She has neurogenic bladder.  She needs to push down on her stomach in order to help void.  She sometimes has constipation or diarrhea.   MRI of brain with and without contrast from 09/08/16 reports "no change in the appearance of numerous subcortical, periventricular, and deep  white matter T2 hyperintense lesions" when compared to prior imaging from 10/23/08.  However, it also reports mild interval enlargement of a meningioma "in the left frontal dura which measures 5 x 11 mm, previously 2 x 10 mm."  She was referred to neurosurgery for evaluation of meningioma.  Continued monitoring was recommended.  MRI of brain with and without contrast from 10/22/17 was stable compared to prior study from 09/08/16 with no evidence of progression or contrast enhancing lesions.   Left frontal convexity meningioma is stable at 8 by 7 mm.  MRI of brain with and without contrast from 10/07/18 was stable compared to prior MRI from 10/22/17.    Past medications/supplements:  B12 (caused tachycardia)  PAST MEDICAL HISTORY: Past Medical History:  Diagnosis Date   Anxiety    Blood transfusion    "when I was born"   Breast cancer ([facility]) 10/2001   Stage II (right side)   Chronic back pain    Confusion    Depression    Gastroparesis    GERD (gastroesophageal reflux disease)    Headache(784.0)    "lots"   Hypoglycemia    MS (multiple sclerosis) (HCC) 1987   Multiple sclerosis    Osteoporosis    Short-term memory loss     MEDICATIONS: Current Outpatient Medications on File Prior to Visit  Medication Sig Dispense Refill   acetaminophen (TYLENOL) 500 MG tablet Take 1,000 mg by mouth every 6 (six) hours as needed for mild pain.     buPROPion (WELLBUTRIN) 75 MG tablet Take 2 tablets (150 mg total) by mouth daily. 60 tablet 5   calcium carbonate (OSCAL) 1500 (600 Ca) MG TABS tablet GIVE 1 TAB BY MOUTH ONCE DAILY 30 tablet 0   Calcium Carbonate-Vitamin D 600-400 MG-UNIT tablet Take 1 tablet by mouth daily. 30 tablet 5   Cholecalciferol (VITAMIN D3) 50 MCG (2000 UT) TABS TAKE 1 TAB BY MOUTH ONCE DAILY 30 tablet 0   cyclobenzaprine (FLEXERIL) 5 MG tablet Take 1 tablet (5 mg total) by mouth 2 (two) times daily as needed for muscle spasms. 6 tablet 0   docusate sodium (COLACE) 100 MG capsule Take 1 capsule (100 mg total) by mouth 2 (two) times daily. 180 capsule 1   fluticasone (FLONASE) 50 MCG/ACT nasal spray PLACE 1 SPRAY INTO EACH NOSTRIL EVERY DAY AS NEEDED 16 g 11   guaiFENesin (ROBITUSSIN) 100 MG/5ML liquid Take 200 mg by mouth every 6 (six) hours as needed for cough.     hydrocortisone cream 1 % Apply twice daily to affected areas 45 g 0   ibuprofen (ADVIL) 600 MG tablet Take 1 tablet (600 mg total) by mouth every 6 (six) hours as needed for moderate pain. 30 tablet 0    ketoconazole (NIZORAL) 2 % cream Apply 1 Application topically daily. 30 g 0   lisinopril (ZESTRIL) 5 MG tablet TAKE 1 TAB BY MOUTH ONCE DAILY 30 tablet 0   loperamide (IMODIUM) 2 MG capsule Take 2 mg by mouth every 4 (four) hours as needed for diarrhea or loose stools.     loratadine (CLARITIN) 10 MG tablet Take 1 tablet (10 mg total) by mouth daily. 30 tablet 2   polyethylene glycol (MIRALAX / GLYCOLAX) 17 g packet MIX 1 PACKET IN 8OZ OF WATER AND DRINK EVERY DAY AS NEEDED 30 packet 11   promethazine (PHENERGAN) 25 MG tablet Take 25 mg by mouth every 6 (six) hours as needed for nausea or vomiting.     senna (SENOKOT) 8.6  MG tablet Take by mouth.     Skin Protectants, Misc. (EUCERIN) cream Apply 1 application topically 2 (two) times daily as needed for dry skin. 454 g 0   terbinafine (LAMISIL) 1 % cream Apply 1 Application topically 2 (two) times daily. 42 g 1   No current facility-administered medications on file prior to visit.    ALLERGIES: Allergies  Allergen Reactions   Amantadines    Sulfa Antibiotics Itching and Rash   Sulfacetamide Sodium Itching and Rash    FAMILY HISTORY: Family History  Problem Relation Age of Onset   Diabetes Mother    Cancer Mother        cervical cancer   Heart disease Mother    Diabetes Father       Objective:  Blood pressure 111/63, pulse 83, height 5\' 4"  (1.626 m), weight 148 lb (67.1 kg), last menstrual period 08/09/2008. General: No acute distress.  Patient appears well-groomed.   Head:  Normocephalic/atraumatic Eyes:  Fundi examined but not visualized Neck: supple, no paraspinal tenderness, full range of motion Heart:  Regular rate and rhythm Neurological Exam: Alert and oriented.  Speech fluent and not dysarthric.  Language intact.      09/10/2023    2:00 PM  MMSE - Mini Mental State Exam  Orientation to time 5  Orientation to Place 5  Registration 3  Attention/ Calculation 1  Recall 2  Language- name 2 objects 2  Language-  repeat 1  Language- follow 3 step command 3  Language- read & follow direction 1  Write a sentence 1  Copy design 1  Total score 25   Unable to correctly enter markers and time on clock face. Bilateral internuclear ophthalmoplegia.  Otherwise, CN II-XII intact.  Bulk and tone normal.  Muscle strength 5-/5 left upper extremity, otherwise 5/5 throughout.  Sensation to light touch intact.  Deep tendon reflexes 2+ throughout.  Finger to nose with bilateral tremor and dysmetria.  Heel-to-shin with dysmetria.  Spastic ataxic gait.  Unable to ambulate without assistance.  Romberg testing deferred.   Shon Millet, DO  CC: Florentina Jenny, MD

## 2023-09-10 ENCOUNTER — Ambulatory Visit (INDEPENDENT_AMBULATORY_CARE_PROVIDER_SITE_OTHER): Payer: 59 | Admitting: Neurology

## 2023-09-10 ENCOUNTER — Encounter: Payer: Self-pay | Admitting: Neurology

## 2023-09-10 VITALS — BP 111/63 | HR 83 | Ht 64.0 in | Wt 148.0 lb

## 2023-09-10 DIAGNOSIS — D320 Benign neoplasm of cerebral meninges: Secondary | ICD-10-CM

## 2023-09-10 DIAGNOSIS — G35 Multiple sclerosis: Secondary | ICD-10-CM

## 2023-09-10 DIAGNOSIS — R4189 Other symptoms and signs involving cognitive functions and awareness: Secondary | ICD-10-CM

## 2023-09-10 NOTE — Patient Instructions (Signed)
No change in management

## 2023-09-24 ENCOUNTER — Other Ambulatory Visit: Payer: Self-pay | Admitting: Family Medicine

## 2023-09-24 DIAGNOSIS — J302 Other seasonal allergic rhinitis: Secondary | ICD-10-CM

## 2023-10-29 ENCOUNTER — Other Ambulatory Visit: Payer: Self-pay | Admitting: Family Medicine

## 2023-10-29 DIAGNOSIS — I10 Essential (primary) hypertension: Secondary | ICD-10-CM

## 2024-09-08 NOTE — Progress Notes (Unsigned)
 NEUROLOGY FOLLOW UP OFFICE NOTE  Stephanie Zhang [id number]  Assessment/Plan:   Secondary progressive multiple sclerosis Cerebral meningioma Major neurocognitive disorder due to MS  DMT:  Not indicated in inactive secondary progressive MS No further changes. Follow up in one year  Total time spent in chart and face to face with patient:  34 minutes   Subjective:  Stephanie Zhang is a 63 year old female with chronic back pain, osteoporosis, GERD, thrombocytopenia and history of breast cancer who follows up for multiple sclerosis.  She is accompanied by her sister who supplements history.     UPDATE: Current DMT:  None Other medications: Bupropion  75 mg daily for depression, D3 50 IU daily    Vision: No issues. Motor: No issues Sensory: No issues Pain: No issues Gait: Able to ambulate with walker.  She walks around the building at least 3 times a day.  She finished a round of PT. Bowel/Bladder: Stress incontinence.  Able to make it to the commode.   Fatigue: Yes Cognition: Short-term memory deficits are stable.  Does not remember certain family members are dead.  No recent visual hallucinations such as involving animals.  Brother manages her finances and is her financial POA Mood: She is sometimes short-tempered but mood overall good.   Gets into bed around 12 AM.  May get 8 hours of sleep a night.  Except for getting up to go to the bathroom, she sleeps well.     HISTORY: She was diagnosed with multiple sclerosis at around age 53.  She reports left sided numbness, ataxia, and visual disturbance.  She had bilateral INO.  She did undergo a lumbar puncture at the time.SABRA   She was initially on Avonex , which was switched to Rebif  in 2004 due to change in insurance.  Rebif  was subsequently denied due to diagnosis of SPMS.   She has required treatment of flare-ups with Solu-Medrol, usually presenting as worsening gait with falls.  As per prior neurologist's notes, her last steroid  treatment was in 2010.  She has had a progressive physical decline since then.   Her ex-husband passed away on 2017/01/19.  Since then, she reports visual and auditory hallucinations.  In the middle of the night, she may see and hear her ex-husband outside the window.  On at least one occasion, she woke up in the middle of the night and heard knocking on her window.  She saw somebody outside speaking [place].  She told the nurses and they didn't see anyone.  One afternoon, she was walking in the courtyard and saw a [place].  Her friend didn't see anything.  Later, when she was in the dining room, she saw the [place] outside the window.  Sometimes, these hallucinations are frighthening.  They occur 2 to 3 times a week.   She currently lives in assisted living at [facility].  At baseline, she exhibits extra-pyramidal symptoms such as tremor.  She has memory deficits.  She requires use of a walker since 2005.  She has neurogenic bladder.  She needs to push down on her stomach in order to help void.  She sometimes has constipation or diarrhea.   MRI of brain with and without contrast from 09/08/16 reports "no change in the appearance of numerous subcortical, periventricular, and deep white matter T2 hyperintense lesions" when compared to prior imaging from 10/23/08.  However, it also reports mild interval enlargement of a meningioma "in the left frontal dura which measures 5 x 11 mm, previously 2 x 10  mm."  She was referred to neurosurgery for evaluation of meningioma.  Continued monitoring was recommended.  MRI of brain with and without contrast from 10/22/17 was stable compared to prior study from 09/08/16 with no evidence of progression or contrast enhancing lesions.  Left frontal convexity meningioma is stable at 8 by 7 mm.  MRI of brain with and without contrast from 10/07/18 was stable compared to prior MRI from 10/22/17.   MRI of brain with and without contrast on 09/26/2022 showed stable extensive demyelinating  disease with white matter atrophy and 1 cm meningioma along the left frontal convexity, stable compared to prior imaging from 09/26/2018.    Past medications/supplements:  B12 (caused tachycardia)  PAST MEDICAL HISTORY: Past Medical History:  Diagnosis Date   Anxiety    Blood transfusion    when I was born   Breast cancer (HCC) 10/2001   Stage II (right side)   Chronic back pain    Confusion    Depression    Gastroparesis    GERD (gastroesophageal reflux disease)    Headache(784.0)    lots   Hypoglycemia    MS (multiple sclerosis) 1987   Multiple sclerosis    Osteoporosis    Short-term memory loss     MEDICATIONS: Current Outpatient Medications on File Prior to Visit  Medication Sig Dispense Refill   acetaminophen  (TYLENOL ) 500 MG tablet Take 1,000 mg by mouth every 6 (six) hours as needed for mild pain.     buPROPion  (WELLBUTRIN ) 75 MG tablet Take 2 tablets (150 mg total) by mouth daily. 60 tablet 5   calcium  carbonate (OSCAL) 1500 (600 Ca) MG TABS tablet GIVE 1 TAB BY MOUTH ONCE DAILY 30 tablet 0   Calcium  Carbonate-Vitamin D  600-400 MG-UNIT tablet Take 1 tablet by mouth daily. 30 tablet 5   Cholecalciferol (VITAMIN D3) 50 MCG (2000 UT) TABS TAKE 1 TAB BY MOUTH ONCE DAILY 30 tablet 0   cyclobenzaprine  (FLEXERIL ) 5 MG tablet Take 1 tablet (5 mg total) by mouth 2 (two) times daily as needed for muscle spasms. (Patient not taking: Reported on 09/10/2023) 6 tablet 0   docusate sodium  (COLACE) 100 MG capsule Take 1 capsule (100 mg total) by mouth 2 (two) times daily. 180 capsule 1   fluticasone  (FLONASE ) 50 MCG/ACT nasal spray PLACE 1 SPRAY INTO EACH NOSTRIL EVERY DAY AS NEEDED (Patient not taking: Reported on 09/10/2023) 16 g 11   guaiFENesin (ROBITUSSIN) 100 MG/5ML liquid Take 200 mg by mouth every 6 (six) hours as needed for cough. (Patient not taking: Reported on 09/10/2023)     hydrocortisone  cream 1 % Apply twice daily to affected areas 45 g 0   ibuprofen  (ADVIL ) 600 MG  tablet Take 1 tablet (600 mg total) by mouth every 6 (six) hours as needed for moderate pain. 30 tablet 0   ketoconazole  (NIZORAL ) 2 % cream Apply 1 Application topically daily. 30 g 0   lisinopril  (ZESTRIL ) 5 MG tablet TAKE 1 TAB BY MOUTH ONCE DAILY 30 tablet 0   loperamide (IMODIUM) 2 MG capsule Take 2 mg by mouth every 4 (four) hours as needed for diarrhea or loose stools. (Patient not taking: Reported on 09/10/2023)     loratadine  (CLARITIN ) 10 MG tablet TAKE 1 TAB BY MOUTH ONCE DAILY 30 tablet 11   polyethylene glycol (MIRALAX  / GLYCOLAX ) 17 g packet MIX 1 PACKET IN 8OZ OF WATER AND DRINK EVERY DAY AS NEEDED (Patient not taking: Reported on 09/10/2023) 30 packet 11   promethazine  (PHENERGAN )  25 MG tablet Take 25 mg by mouth every 6 (six) hours as needed for nausea or vomiting.     senna (SENOKOT) 8.6 MG tablet Take by mouth. (Patient not taking: Reported on 09/10/2023)     Skin Protectants, Misc. (EUCERIN) cream Apply 1 application topically 2 (two) times daily as needed for dry skin. (Patient not taking: Reported on 09/10/2023) 454 g 0   terbinafine  (LAMISIL ) 1 % cream Apply 1 Application topically 2 (two) times daily. 42 g 1   No current facility-administered medications on file prior to visit.    ALLERGIES: Allergies  Allergen Reactions   Amantadines    Sulfa Antibiotics Itching and Rash   Sulfacetamide Sodium Itching and Rash    FAMILY HISTORY: Family History  Problem Relation Age of Onset   Diabetes Mother    Cancer Mother        cervical cancer   Heart disease Mother    Diabetes Father       Objective:  Blood pressure 111/63, pulse 83, height 5' 4 (1.626 m), weight 148 lb (67.1 kg), last menstrual period 08/09/2008. General: No acute distress.  Patient appears well-groomed.   Head:  Normocephalic/atraumatic Eyes:  Fundi examined but not visualized Neck: supple, no paraspinal tenderness, full range of motion Heart:  Regular rate and rhythm Neurological Exam: Alert and  oriented.  Speech fluent and not dysarthric.  Language intact.      09/10/2023    2:00 PM  MMSE - Mini Mental State Exam  Orientation to time 5  Orientation to Place 5  Registration 3  Attention/ Calculation 1  Recall 2  Language- name 2 objects 2  Language- repeat 1  Language- follow 3 step command 3  Language- read & follow direction 1  Write a sentence 1  Copy design 1  Total score 25   Unable to correctly enter markers and time on clock face. Bilateral internuclear ophthalmoplegia.  Otherwise, CN II-XII intact.  Bulk and tone normal.  Muscle strength 5-/5 left upper extremity, otherwise 5/5 throughout.  Sensation to light touch intact.  Deep tendon reflexes 2+ throughout.  Finger to nose with bilateral tremor and dysmetria.  Heel-to-shin with dysmetria.  Spastic ataxic gait.  Unable to ambulate without assistance.  Romberg testing deferred.   Juliene Dunnings, DO  CC: Victory Bamberger, MD

## 2024-09-09 ENCOUNTER — Encounter: Payer: Self-pay | Admitting: Neurology

## 2024-09-09 ENCOUNTER — Ambulatory Visit (INDEPENDENT_AMBULATORY_CARE_PROVIDER_SITE_OTHER): Payer: 59 | Admitting: Neurology

## 2024-09-09 VITALS — BP 112/66 | HR 98 | Ht 60.0 in | Wt 147.8 lb

## 2024-09-09 DIAGNOSIS — G35C0 Secondary progressive multiple sclerosis, unspecified: Secondary | ICD-10-CM | POA: Diagnosis not present

## 2024-09-09 NOTE — Patient Instructions (Signed)
 Follow up one year

## 2025-09-10 ENCOUNTER — Ambulatory Visit: Admitting: Neurology
